# Patient Record
Sex: Female | Born: 1945 | Race: Black or African American | Hispanic: No | Marital: Single | Smoking: Former smoker
Health system: Southern US, Community
[De-identification: ages and names within clinical notes are randomized; demographics above are authoritative.]

## PROBLEM LIST (undated history)

## (undated) DIAGNOSIS — I639 Cerebral infarction, unspecified: Secondary | ICD-10-CM

## (undated) DIAGNOSIS — F411 Generalized anxiety disorder: Secondary | ICD-10-CM

## (undated) DIAGNOSIS — N289 Disorder of kidney and ureter, unspecified: Secondary | ICD-10-CM

## (undated) DIAGNOSIS — R4189 Other symptoms and signs involving cognitive functions and awareness: Secondary | ICD-10-CM

## (undated) DIAGNOSIS — I10 Essential (primary) hypertension: Secondary | ICD-10-CM

## (undated) DIAGNOSIS — J309 Allergic rhinitis, unspecified: Secondary | ICD-10-CM

## (undated) DIAGNOSIS — F329 Major depressive disorder, single episode, unspecified: Secondary | ICD-10-CM

## (undated) DIAGNOSIS — H269 Unspecified cataract: Secondary | ICD-10-CM

## (undated) DIAGNOSIS — F0390 Unspecified dementia without behavioral disturbance: Secondary | ICD-10-CM

## (undated) DIAGNOSIS — H409 Unspecified glaucoma: Secondary | ICD-10-CM

## (undated) DIAGNOSIS — E785 Hyperlipidemia, unspecified: Secondary | ICD-10-CM

## (undated) DIAGNOSIS — J45909 Unspecified asthma, uncomplicated: Secondary | ICD-10-CM

## (undated) DIAGNOSIS — H547 Unspecified visual loss: Secondary | ICD-10-CM

## (undated) HISTORY — DX: Unspecified visual loss: H54.7

## (undated) HISTORY — DX: Generalized anxiety disorder: F41.1

## (undated) HISTORY — PX: OTHER SURGICAL HISTORY: SHX169

## (undated) HISTORY — DX: Major depressive disorder, single episode, unspecified: F32.9

## (undated) HISTORY — DX: Allergic rhinitis, unspecified: J30.9

## (undated) HISTORY — DX: Other symptoms and signs involving cognitive functions and awareness: R41.89

## (undated) HISTORY — DX: Hyperlipidemia, unspecified: E78.5

## (undated) HISTORY — DX: Disorder of kidney and ureter, unspecified: N28.9

## (undated) HISTORY — DX: Unspecified glaucoma: H40.9

## (undated) HISTORY — PX: DILATION AND CURETTAGE OF UTERUS: SHX78

## (undated) HISTORY — DX: Unspecified cataract: H26.9

## (undated) HISTORY — DX: Unspecified dementia, unspecified severity, without behavioral disturbance, psychotic disturbance, mood disturbance, and anxiety: F03.90

## (undated) HISTORY — DX: Unspecified asthma, uncomplicated: J45.909

---

## 2000-07-31 DIAGNOSIS — I639 Cerebral infarction, unspecified: Secondary | ICD-10-CM

## 2000-07-31 HISTORY — DX: Cerebral infarction, unspecified: I63.9

## 2011-09-20 DIAGNOSIS — F411 Generalized anxiety disorder: Secondary | ICD-10-CM | POA: Diagnosis not present

## 2011-09-20 DIAGNOSIS — Z124 Encounter for screening for malignant neoplasm of cervix: Secondary | ICD-10-CM | POA: Diagnosis not present

## 2011-10-05 DIAGNOSIS — E785 Hyperlipidemia, unspecified: Secondary | ICD-10-CM | POA: Diagnosis not present

## 2011-12-14 DIAGNOSIS — I10 Essential (primary) hypertension: Secondary | ICD-10-CM | POA: Diagnosis not present

## 2011-12-14 DIAGNOSIS — J309 Allergic rhinitis, unspecified: Secondary | ICD-10-CM | POA: Diagnosis not present

## 2012-03-14 DIAGNOSIS — I10 Essential (primary) hypertension: Secondary | ICD-10-CM | POA: Diagnosis not present

## 2012-03-14 DIAGNOSIS — I6789 Other cerebrovascular disease: Secondary | ICD-10-CM | POA: Diagnosis not present

## 2012-06-18 DIAGNOSIS — F411 Generalized anxiety disorder: Secondary | ICD-10-CM | POA: Diagnosis not present

## 2012-06-18 DIAGNOSIS — J309 Allergic rhinitis, unspecified: Secondary | ICD-10-CM | POA: Diagnosis not present

## 2012-06-18 DIAGNOSIS — I10 Essential (primary) hypertension: Secondary | ICD-10-CM | POA: Diagnosis not present

## 2012-10-31 DIAGNOSIS — J309 Allergic rhinitis, unspecified: Secondary | ICD-10-CM | POA: Diagnosis not present

## 2012-10-31 DIAGNOSIS — I10 Essential (primary) hypertension: Secondary | ICD-10-CM | POA: Diagnosis not present

## 2012-10-31 DIAGNOSIS — R413 Other amnesia: Secondary | ICD-10-CM | POA: Diagnosis not present

## 2013-03-11 DIAGNOSIS — J309 Allergic rhinitis, unspecified: Secondary | ICD-10-CM | POA: Diagnosis not present

## 2013-03-11 DIAGNOSIS — I10 Essential (primary) hypertension: Secondary | ICD-10-CM | POA: Diagnosis not present

## 2013-04-08 DIAGNOSIS — I10 Essential (primary) hypertension: Secondary | ICD-10-CM | POA: Diagnosis not present

## 2013-04-08 DIAGNOSIS — Z0000 Encounter for general adult medical examination without abnormal findings: Secondary | ICD-10-CM | POA: Diagnosis not present

## 2013-04-08 DIAGNOSIS — Z124 Encounter for screening for malignant neoplasm of cervix: Secondary | ICD-10-CM | POA: Diagnosis not present

## 2013-05-16 ENCOUNTER — Emergency Department (HOSPITAL_COMMUNITY): Payer: Medicare Other

## 2013-05-16 ENCOUNTER — Emergency Department (HOSPITAL_COMMUNITY)
Admission: EM | Admit: 2013-05-16 | Discharge: 2013-05-16 | Disposition: A | Discharge status: Home / Self Care | Payer: Medicare Other | Attending: Emergency Medicine | Admitting: Emergency Medicine

## 2013-05-16 ENCOUNTER — Encounter (HOSPITAL_COMMUNITY): Payer: Self-pay | Admitting: Emergency Medicine

## 2013-05-16 DIAGNOSIS — Y9289 Other specified places as the place of occurrence of the external cause: Secondary | ICD-10-CM | POA: Insufficient documentation

## 2013-05-16 DIAGNOSIS — S8000XA Contusion of unspecified knee, initial encounter: Secondary | ICD-10-CM | POA: Insufficient documentation

## 2013-05-16 DIAGNOSIS — S92919A Unspecified fracture of unspecified toe(s), initial encounter for closed fracture: Secondary | ICD-10-CM | POA: Diagnosis not present

## 2013-05-16 DIAGNOSIS — Z87891 Personal history of nicotine dependence: Secondary | ICD-10-CM | POA: Diagnosis not present

## 2013-05-16 DIAGNOSIS — Y9301 Activity, walking, marching and hiking: Secondary | ICD-10-CM | POA: Insufficient documentation

## 2013-05-16 DIAGNOSIS — IMO0002 Reserved for concepts with insufficient information to code with codable children: Secondary | ICD-10-CM | POA: Insufficient documentation

## 2013-05-16 DIAGNOSIS — X500XXA Overexertion from strenuous movement or load, initial encounter: Secondary | ICD-10-CM | POA: Insufficient documentation

## 2013-05-16 DIAGNOSIS — Z79899 Other long term (current) drug therapy: Secondary | ICD-10-CM | POA: Diagnosis not present

## 2013-05-16 DIAGNOSIS — I10 Essential (primary) hypertension: Secondary | ICD-10-CM | POA: Diagnosis not present

## 2013-05-16 DIAGNOSIS — Z8673 Personal history of transient ischemic attack (TIA), and cerebral infarction without residual deficits: Secondary | ICD-10-CM | POA: Insufficient documentation

## 2013-05-16 DIAGNOSIS — S92911A Unspecified fracture of right toe(s), initial encounter for closed fracture: Secondary | ICD-10-CM

## 2013-05-16 DIAGNOSIS — S8001XA Contusion of right knee, initial encounter: Secondary | ICD-10-CM

## 2013-05-16 DIAGNOSIS — S8990XA Unspecified injury of unspecified lower leg, initial encounter: Secondary | ICD-10-CM | POA: Diagnosis not present

## 2013-05-16 HISTORY — DX: Cerebral infarction, unspecified: I63.9

## 2013-05-16 HISTORY — DX: Essential (primary) hypertension: I10

## 2013-05-16 NOTE — ED Provider Notes (Signed)
CSN: [id number]     Arrival date & time 05/16/13  2146 History  This chart was scribed for non-physician practitioner Kyung Bacca, PA-C working with No att. providers found by Caryn Bee, ED Scribe. This patient was seen in room WTR9/WTR9 and the patient's care was started at 11:02 PM.    Chief Complaint  Patient presents with  . Toe Pain   Patient is a 67 y.o. female presenting with toe pain. The history is provided by the patient. No language interpreter was used.  Toe Pain This is a new problem. The current episode started 1 to 2 hours ago. The problem occurs constantly. The problem has not changed since onset.The symptoms are aggravated by walking (Movement). Nothing relieves the symptoms. She has tried nothing for the symptoms.   HPI Comments: Stacy Brennan is a 67 y.o. female who presents to the Emergency Department complaining of fall that occurred a few hours ago while pt was walking across parking lot and tripped.  Twisted right ankle and landed on her right knee.  C/o pain in her right third toe.  Unable to bear weight.  She has associated numbness of same toe. Pt has an abrasion on her right knee from the fall. Pt has not taken any medication for the pain. Pt denies having lightheadedness/dizziness prior to fall.  Did not hit her head.  Denies neck/back pain.  Past Medical History  Diagnosis Date  . Stroke     CVA x 2  . Hypertension    History reviewed. No pertinent past surgical history. No family history on file. History  Substance Use Topics  . Smoking status: Former Games developer  . Smokeless tobacco: Not on file  . Alcohol Use: No   OB History   Grav Para Term Preterm Abortions TAB SAB Ect Mult Living                 Review of Systems  Musculoskeletal: Positive for arthralgias (Third right toe). Negative for back pain and neck pain.  Skin: Positive for wound (Right knee).  Neurological: Positive for numbness. Negative for weakness and light-headedness.   All other systems reviewed and are negative.    Allergies  Review of patient's allergies indicates no known allergies.  Home Medications   Current Outpatient Rx  Name  Route  Sig  Dispense  Refill  . ALPRAZolam (XANAX) 1 MG tablet   Oral   Take 1 mg by mouth 3 (three) times daily as needed for anxiety.         Marland Kitchen azelastine (ASTELIN) 137 MCG/SPRAY nasal spray   Nasal   Place 1 spray into the nose every morning. Use in each nostril as directed         . cholecalciferol (VITAMIN D) 1000 UNITS tablet   Oral   Take 1,000 Units by mouth every morning.         . clopidogrel (PLAVIX) 75 MG tablet   Oral   Take 75 mg by mouth every morning.         . labetalol (NORMODYNE) 200 MG tablet   Oral   Take 200 mg by mouth 2 (two) times daily.         . Multiple Vitamin (MULTIVITAMIN WITH MINERALS) TABS tablet   Oral   Take 1 tablet by mouth every morning.         Marland Kitchen spironolactone (ALDACTONE) 25 MG tablet   Oral   Take 25 mg by mouth every morning.         Marland Kitchen  verapamil (CALAN-SR) 240 MG CR tablet   Oral   Take 240 mg by mouth at bedtime.         . zafirlukast (ACCOLATE) 10 MG tablet   Oral   Take 5 mg by mouth 2 (two) times daily.          Triage Vitals: BP 218/91  Pulse 70  Temp(Src) 98.4 F (36.9 C) (Oral)  Resp 18  Ht 5\' 7"  (1.702 m)  Wt 176 lb (79.833 kg)  BMI 27.56 kg/m2  SpO2 100%  Physical Exam  Nursing note and vitals reviewed. Constitutional: She is oriented to person, place, and time. She appears well-developed and well-nourished. No distress.  HENT:  Head: Normocephalic and atraumatic.  Eyes:  Normal appearance  Neck: Normal range of motion.  Cardiovascular: Normal rate.   hypertensive  Pulmonary/Chest: Effort normal.  Musculoskeletal: Normal range of motion.  Superficial, hemostatic abrasion, mild edema and minimal tenderness anterior right knee.  No pain w/ ROM.  No edema, tenderness or limited ROM of right ankle.  3rd toe mildly  deformed and ttp.  Sensation intact and brisk cap refill all toes.   Neurological: She is alert and oriented to person, place, and time.  Psychiatric: She has a normal mood and affect. Her behavior is normal.    ED Course  Procedures (including critical care time) DIAGNOSTIC STUDIES: Oxygen Saturation is 100% on room air, normal by my interpretation.    Labs Review Labs Reviewed - No data to display Imaging Review Dg Knee Complete 4 Views Right  05/16/2013   CLINICAL DATA:  Fall.  EXAM: RIGHT KNEE - COMPLETE 4+ VIEW  COMPARISON:  None.  FINDINGS: Negative for fracture or malalignment. No joint effusion. Atherosclerosis and diffuse osseous demineralization.  IMPRESSION: Negative.   Electronically Signed   By: Tiburcio Pea M.D.   On: 05/16/2013 23:10   Dg Foot Complete Right  05/16/2013   CLINICAL DATA:  Third and 4th toe pain, status post tripping.  EXAM: RIGHT FOOT COMPLETE - 3+ VIEW  COMPARISON:  None.  FINDINGS: There is an acute minimally displaced fractures through the distal shaft of the 3rd proximal phalanx. Small amount of associated soft tissue density. No other acute fracture. No radiopaque foreign body. Osseous mineralization is normal. Plantar calcaneal enthesophyte is noted.  IMPRESSION: Acute minimally displaced fracture through the distal shaft of the 3rd proximal phalanx.   Electronically Signed   By: Rise Mu M.D.   On: 05/16/2013 23:29    EKG Interpretation   None       MDM   1. Fracture of right toe, closed, initial encounter   2. Contusion of knee, right, initial encounter    67yo F had a mechanical fall this evening, twisted right ankle and landed on right knee, and presents w/ R knee as well as right 3rd toe pain.  Xray knee negative and foot shows mildly displaced fracture of proximal phalanx right 3rd toe.  Results discussed w/ pt.  Ortho tech buddy taped toes and provided her w/ post-op shoe.  Recommended ice/elevation.  She will talk to her PCP  about safe analgesics as well as persistent HTN (did not take her afternoon/evening meds today; denies dizziness/headache/vision changes/CP/SOB).     I personally performed the services described in this documentation, which was scribed in my presence. The recorded information has been reviewed and is accurate.    Otilio Miu, PA-C 05/17/13 0020

## 2013-05-16 NOTE — ED Notes (Signed)
Pt states while walking across parking lot hit foot and fell onto R knee. Abrasion noted to R 2nd toe, R knee. Denies LOC, denies other injury.

## 2013-05-17 NOTE — ED Provider Notes (Signed)
Medical screening examination/treatment/procedure(s) were performed by non-physician practitioner and as supervising physician I was immediately available for consultation/collaboration.     Celene Kras, MD 05/17/13 [phone]

## 2013-07-09 DIAGNOSIS — J45909 Unspecified asthma, uncomplicated: Secondary | ICD-10-CM | POA: Diagnosis not present

## 2013-07-09 DIAGNOSIS — I10 Essential (primary) hypertension: Secondary | ICD-10-CM | POA: Diagnosis not present

## 2013-07-09 DIAGNOSIS — I6350 Cerebral infarction due to unspecified occlusion or stenosis of unspecified cerebral artery: Secondary | ICD-10-CM | POA: Diagnosis not present

## 2013-07-09 DIAGNOSIS — J309 Allergic rhinitis, unspecified: Secondary | ICD-10-CM | POA: Diagnosis not present

## 2013-07-10 DIAGNOSIS — M65979 Unspecified synovitis and tenosynovitis, unspecified ankle and foot: Secondary | ICD-10-CM | POA: Diagnosis not present

## 2013-07-10 DIAGNOSIS — M79609 Pain in unspecified limb: Secondary | ICD-10-CM | POA: Diagnosis not present

## 2013-07-10 DIAGNOSIS — S92919A Unspecified fracture of unspecified toe(s), initial encounter for closed fracture: Secondary | ICD-10-CM | POA: Diagnosis not present

## 2013-07-10 DIAGNOSIS — M659 Synovitis and tenosynovitis, unspecified: Secondary | ICD-10-CM | POA: Diagnosis not present

## 2013-07-17 DIAGNOSIS — S92919A Unspecified fracture of unspecified toe(s), initial encounter for closed fracture: Secondary | ICD-10-CM | POA: Diagnosis not present

## 2013-08-14 DIAGNOSIS — H4011X0 Primary open-angle glaucoma, stage unspecified: Secondary | ICD-10-CM | POA: Diagnosis not present

## 2013-09-02 DIAGNOSIS — L608 Other nail disorders: Secondary | ICD-10-CM | POA: Diagnosis not present

## 2013-09-02 DIAGNOSIS — I739 Peripheral vascular disease, unspecified: Secondary | ICD-10-CM | POA: Diagnosis not present

## 2013-09-24 DIAGNOSIS — H4011X0 Primary open-angle glaucoma, stage unspecified: Secondary | ICD-10-CM | POA: Diagnosis not present

## 2013-10-03 ENCOUNTER — Ambulatory Visit (INDEPENDENT_AMBULATORY_CARE_PROVIDER_SITE_OTHER): Payer: Medicare Other | Admitting: Internal Medicine

## 2013-10-03 ENCOUNTER — Encounter: Payer: Self-pay | Admitting: Internal Medicine

## 2013-10-03 ENCOUNTER — Ambulatory Visit: Payer: Medicare Other | Admitting: Internal Medicine

## 2013-10-03 VITALS — BP 152/100 | HR 65 | Temp 98.8°F | Wt 165.8 lb

## 2013-10-03 DIAGNOSIS — I10 Essential (primary) hypertension: Secondary | ICD-10-CM | POA: Diagnosis not present

## 2013-10-03 DIAGNOSIS — J069 Acute upper respiratory infection, unspecified: Secondary | ICD-10-CM | POA: Diagnosis not present

## 2013-10-03 DIAGNOSIS — H409 Unspecified glaucoma: Secondary | ICD-10-CM

## 2013-10-03 DIAGNOSIS — I6350 Cerebral infarction due to unspecified occlusion or stenosis of unspecified cerebral artery: Secondary | ICD-10-CM

## 2013-10-03 DIAGNOSIS — I639 Cerebral infarction, unspecified: Secondary | ICD-10-CM

## 2013-10-03 MED ORDER — HYDROCODONE-HOMATROPINE 5-1.5 MG/5ML PO SYRP: 5.0000 mL | ORAL_SOLUTION | Freq: Four times a day (QID) | ORAL | Status: DC | PRN

## 2013-10-03 MED ORDER — AZITHROMYCIN 250 MG PO TABS: ORAL_TABLET | ORAL | Status: DC

## 2013-10-03 NOTE — Patient Instructions (Signed)
Please take all new medication as prescribed  Please continue all other medications as before, and refills have been done if requested.  Please have the pharmacy call with any other refills you may need.   

## 2013-10-03 NOTE — Progress Notes (Signed)
   Subjective:    Patient ID: Stacy Brennan, female    DOB: 1945-08-17, 68 y.o.   MRN: [id number]  HPI   Here with 2-3 days acute onset fever, facial pain, pressure, headache, general weakness and malaise, and greenish d/c, with mild ST and cough, but pt denies chest pain, wheezing, increased sob or doe, orthopnea, PND, increased LE swelling, palpitations, dizziness or syncope. Past Medical History  Diagnosis Date  . Stroke     CVA x 2  . Hypertension   . Glaucoma    No past surgical history on file.  reports that she has quit smoking. She does not have any smokeless tobacco history on file. She reports that she does not drink alcohol or use illicit drugs. family history includes Cancer in her other; Diabetes in her other; Hypertension in her other. No Known Allergies Current Outpatient Prescriptions on File Prior to Visit  Medication Sig Dispense Refill  . ALPRAZolam (XANAX) 1 MG tablet Take 1 mg by mouth 3 (three) times daily as needed for anxiety.      . cholecalciferol (VITAMIN D) 1000 UNITS tablet Take 1,000 Units by mouth every morning.      . clopidogrel (PLAVIX) 75 MG tablet Take 75 mg by mouth every morning.      . labetalol (NORMODYNE) 200 MG tablet Take 200 mg by mouth 2 (two) times daily.      . Multiple Vitamin (MULTIVITAMIN WITH MINERALS) TABS tablet Take 1 tablet by mouth every morning.      Marland Kitchen spironolactone (ALDACTONE) 25 MG tablet Take 25 mg by mouth every morning.      . verapamil (CALAN-SR) 240 MG CR tablet Take 240 mg by mouth at bedtime.      . zafirlukast (ACCOLATE) 10 MG tablet Take 5 mg by mouth 2 (two) times daily.       No current facility-administered medications on file prior to visit.   Review of Systems All otherwise neg per pt     Objective:   Physical Exam BP 152/100  Pulse 65  Temp(Src) 98.8 F (37.1 C) (Oral)  Wt 165 lb 12 oz (75.184 kg)  SpO2 97% VS noted, mild ill Constitutional: Pt appears well-developed and well-nourished.  HENT:  Head: NCAT.  Right Ear: External ear normal.  Left Ear: External ear normal.  Bilat tm's with mild erythema.  Max sinus areas mild tender.  Pharynx with mild erythema, no exudate Eyes: Conjunctivae and EOM are normal. Pupils are equal, round, and reactive to light.  Neck: Normal range of motion. Neck supple.  Cardiovascular: Normal rate and regular rhythm.   Pulmonary/Chest: Effort normal and breath sounds normal.  Neurological: Pt is alert. Not confused  Skin: Skin is warm. No erythema.  Psychiatric: Pt behavior is normal. Thought content normal.     Assessment & Plan:

## 2013-10-03 NOTE — Progress Notes (Signed)
Pre visit review using our clinic review tool, if applicable. No additional management support is needed unless otherwise documented below in the visit note. 

## 2013-10-03 NOTE — Assessment & Plan Note (Signed)
Mild to mod, for antibx course,  to f/u any worsening symptoms or concerns 

## 2013-11-24 DIAGNOSIS — I739 Peripheral vascular disease, unspecified: Secondary | ICD-10-CM | POA: Diagnosis not present

## 2013-11-24 DIAGNOSIS — L608 Other nail disorders: Secondary | ICD-10-CM | POA: Diagnosis not present

## 2013-12-02 ENCOUNTER — Other Ambulatory Visit: Payer: Self-pay

## 2013-12-02 MED ORDER — ALPRAZOLAM 1 MG PO TABS: 1.0000 mg | ORAL_TABLET | Freq: Three times a day (TID) | ORAL | Status: DC | PRN

## 2013-12-02 NOTE — Telephone Encounter (Signed)
Faxed hardcopy to [place].

## 2013-12-24 DIAGNOSIS — H4011X0 Primary open-angle glaucoma, stage unspecified: Secondary | ICD-10-CM | POA: Diagnosis not present

## 2014-01-01 ENCOUNTER — Encounter: Payer: Self-pay | Admitting: Internal Medicine

## 2014-01-01 DIAGNOSIS — N289 Disorder of kidney and ureter, unspecified: Secondary | ICD-10-CM

## 2014-01-01 DIAGNOSIS — J309 Allergic rhinitis, unspecified: Secondary | ICD-10-CM | POA: Insufficient documentation

## 2014-01-01 DIAGNOSIS — F32A Depression, unspecified: Secondary | ICD-10-CM

## 2014-01-01 DIAGNOSIS — E785 Hyperlipidemia, unspecified: Secondary | ICD-10-CM

## 2014-01-01 DIAGNOSIS — F411 Generalized anxiety disorder: Secondary | ICD-10-CM

## 2014-01-01 DIAGNOSIS — Z0000 Encounter for general adult medical examination without abnormal findings: Secondary | ICD-10-CM | POA: Insufficient documentation

## 2014-01-01 DIAGNOSIS — F329 Major depressive disorder, single episode, unspecified: Secondary | ICD-10-CM | POA: Insufficient documentation

## 2014-01-01 DIAGNOSIS — J45909 Unspecified asthma, uncomplicated: Secondary | ICD-10-CM

## 2014-01-01 HISTORY — DX: Disorder of kidney and ureter, unspecified: N28.9

## 2014-01-01 HISTORY — DX: Hyperlipidemia, unspecified: E78.5

## 2014-01-01 HISTORY — DX: Unspecified asthma, uncomplicated: J45.909

## 2014-01-01 HISTORY — DX: Allergic rhinitis, unspecified: J30.9

## 2014-01-01 HISTORY — DX: Generalized anxiety disorder: F41.1

## 2014-01-01 HISTORY — DX: Depression, unspecified: F32.A

## 2014-01-06 ENCOUNTER — Other Ambulatory Visit: Payer: Self-pay

## 2014-01-06 MED ORDER — SPIRONOLACTONE 25 MG PO TABS: 25.0000 mg | ORAL_TABLET | Freq: Every morning | ORAL | Status: DC

## 2014-01-10 ENCOUNTER — Other Ambulatory Visit: Payer: Self-pay | Admitting: Internal Medicine

## 2014-01-13 ENCOUNTER — Telehealth: Payer: Self-pay

## 2014-01-13 MED ORDER — ALPRAZOLAM 1 MG PO TABS: ORAL_TABLET | ORAL | Status: DC

## 2014-01-13 NOTE — Telephone Encounter (Signed)
Called to have a refill for xanax qty 90 sent to [facility]. Pt takes these tid for anxiety. The wrong quantity was sent to [place] for a qty of 30. Please advise

## 2014-01-13 NOTE — Telephone Encounter (Signed)
Faxed hardcopy to [place].  Called the patient to inform, but twice was told the wrong number.

## 2014-01-13 NOTE — Telephone Encounter (Signed)
rx changed

## 2014-01-13 NOTE — Telephone Encounter (Signed)
Done hardcopy to [facility]  

## 2014-01-13 NOTE — Telephone Encounter (Signed)
Faxed hardcopy to [facility]

## 2014-03-03 ENCOUNTER — Other Ambulatory Visit: Payer: Self-pay

## 2014-03-03 MED ORDER — ZAFIRLUKAST 10 MG PO TABS: 5.0000 mg | ORAL_TABLET | Freq: Two times a day (BID) | ORAL | Status: DC

## 2014-03-03 MED ORDER — LEVOCETIRIZINE DIHYDROCHLORIDE 5 MG PO TABS: 5.0000 mg | ORAL_TABLET | Freq: Every day | ORAL | Status: DC

## 2014-03-03 MED ORDER — LABETALOL HCL 200 MG PO TABS: 200.0000 mg | ORAL_TABLET | Freq: Two times a day (BID) | ORAL | Status: DC

## 2014-03-11 ENCOUNTER — Other Ambulatory Visit: Payer: Self-pay

## 2014-03-11 MED ORDER — VERAPAMIL HCL ER 240 MG PO TBCR: 240.0000 mg | EXTENDED_RELEASE_TABLET | Freq: Every day | ORAL | Status: DC

## 2014-03-12 DIAGNOSIS — H5310 Unspecified subjective visual disturbances: Secondary | ICD-10-CM | POA: Diagnosis not present

## 2014-03-19 ENCOUNTER — Encounter: Payer: Self-pay | Admitting: *Deleted

## 2014-03-20 ENCOUNTER — Encounter: Payer: Self-pay | Admitting: Neurology

## 2014-03-20 ENCOUNTER — Ambulatory Visit (INDEPENDENT_AMBULATORY_CARE_PROVIDER_SITE_OTHER): Payer: Medicare Other | Admitting: Neurology

## 2014-03-20 VITALS — BP 162/82 | HR 66 | Ht 65.0 in | Wt 159.0 lb

## 2014-03-20 DIAGNOSIS — I6350 Cerebral infarction due to unspecified occlusion or stenosis of unspecified cerebral artery: Secondary | ICD-10-CM | POA: Diagnosis not present

## 2014-03-20 DIAGNOSIS — G4710 Hypersomnia, unspecified: Secondary | ICD-10-CM

## 2014-03-20 DIAGNOSIS — R413 Other amnesia: Secondary | ICD-10-CM | POA: Diagnosis not present

## 2014-03-20 DIAGNOSIS — R404 Transient alteration of awareness: Secondary | ICD-10-CM

## 2014-03-20 DIAGNOSIS — F329 Major depressive disorder, single episode, unspecified: Secondary | ICD-10-CM

## 2014-03-20 DIAGNOSIS — I639 Cerebral infarction, unspecified: Secondary | ICD-10-CM

## 2014-03-20 DIAGNOSIS — F3289 Other specified depressive episodes: Secondary | ICD-10-CM

## 2014-03-20 DIAGNOSIS — IMO0001 Reserved for inherently not codable concepts without codable children: Secondary | ICD-10-CM

## 2014-03-20 DIAGNOSIS — G4719 Other hypersomnia: Secondary | ICD-10-CM

## 2014-03-20 DIAGNOSIS — R0683 Snoring: Secondary | ICD-10-CM

## 2014-03-20 DIAGNOSIS — R4189 Other symptoms and signs involving cognitive functions and awareness: Secondary | ICD-10-CM

## 2014-03-20 DIAGNOSIS — H269 Unspecified cataract: Secondary | ICD-10-CM

## 2014-03-20 DIAGNOSIS — F32A Depression, unspecified: Secondary | ICD-10-CM

## 2014-03-20 DIAGNOSIS — G4700 Insomnia, unspecified: Secondary | ICD-10-CM

## 2014-03-20 NOTE — Patient Instructions (Signed)
Overall you are doing fairly well but I do want to suggest a few things today:   Remember to drink plenty of fluid, eat healthy meals and do not skip any meals. Try to eat protein with a every meal and eat a healthy snack such as fruit or nuts in between meals. Try to keep a regular sleep-wake schedule and try to exercise daily, particularly in the form of walking, 20-30 minutes a day, if you can.   As far as diagnostic testing: Sleep study, EEG, blood work and carotid dopplers  I would like to see you back in 3 months, sooner if we need to. Please call us with any interim questions, concerns, problems, updates or refill requests.   We will call for any test results so we can go over those with you on the phone.  My clinical assistant and can answer any of your questions and relay your messages to me and also relay most of my messages to you.   Our phone number is [phone]. We also have an after hours call service for urgent matters and there is a physician on-call for urgent questions. For any emergencies you know to call 911 or go to the nearest emergency room

## 2014-03-20 NOTE — Progress Notes (Signed)
[facility]    Provider:  Dr Jaynee Eagles Referring Provider: Biagio Borg, MD Primary Care Physician:  Cathlean Cower, MD  CC:  Visual difficulties and difficulty processing information  HPI:  Stacy Brennan is a 68 y.o. female here as a referral from Dr. Jenny Reichmann for Visual difficulties  68 year old female with a PMHx of HTN, Stroke, Severe Stage Glaucoma and senile cataract who is here for evaluation of vision problems. Accompanied by daughter. Per daughter,  patient is having difficulty seeing for example she thought a paper towel was a salad. Been going on for at least a year. Another example is that she looks in the refrigerator and sees lettuce and thinks it is grapes. Can't sign on a line because she can't see it. No hallucinations or delusions. She runs into tables. Is here with daughter who states that her mom remembers things overall just ok and there is  forgetfulness. Mom is sometimes going the opposite way to stores. The mother disagrees. Is corrected by daughter. In the grocery store mother forgets. Gets anxious about money. Memory symptoms progressing. Mother can't find the car. Patient has lost interest in going to some events. Mother won't go to daughter's house unless daughter encourages. Mom says it is because she can't see. Patient has some depression because she had to move in with daughter. Patient misses her church but she can't get to her church now cause it is too far away. Moved in 4 years ago and that loss hasn't resolved. +sadness, loss of interest, loss of concentration. Goes to bed around 1:30am and Per daughter has insomnia and doesn't go to sleep until 2-5am. +snoring. No headache. +excessive tiredness during the day.   Review of notes from [facility]: VA OD 20/200, VA OS 20/200, VA OU 20/150. She has severe stage glaucoma, senile cataract  Review of Systems: Patient complains of symptoms per HPI as well as the following symptoms blurred  vision, fatigue, loss of vision, easy bruising, confusion, insomnia, sleepiness, snoring, constipation, depression, anxiety, decreased energy, disinterest in activities. Pertinent negatives per HPI. Otherwise out of a complete 14 system review, and all other reviewed systems are negative.   History   Social History  . Marital Status: Single    Spouse Name: N/A    Number of Children: 4  . Years of Education: 12   Occupational History  . [place] History Main Topics  . Smoking status: Former Smoker    Quit date: 03/20/2002  . Smokeless tobacco: Current User    Types: Snuff  . Alcohol Use: No  . Drug Use: No  . Sexual Activity: Not on file   Other Topics Concern  . Not on file   Social History Narrative   Patient is single with 4 children.   Patient is right handed.   Patient has a high school education.   Patient drinks 1 cup a week.    Family History  Problem Relation Age of Onset  . Cancer Other     prostate cancer  . Hypertension Other   . Diabetes Other   . Thyroid disease    . Hyperlipidemia      Past Medical History  Diagnosis Date  . 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 Hypertension   . Glaucoma   . Anxiety state, unspecified 01/01/2014  . Other and unspecified hyperlipidemia 01/01/2014  . Renal insufficiency 01/01/2014  . Allergic rhinitis, cause unspecified  01/01/2014  . Unspecified asthma(493.90) 01/01/2014  . Depression 01/01/2014    Past Surgical History  Procedure Laterality Date  . Dilation and curettage of uterus    . Tubalization      Current Outpatient Prescriptions  Medication Sig Dispense Refill  . ALPRAZolam (XANAX) 1 MG tablet TAKE 1 TABLET BY MOUTH THREE TIMES DAILY  90 tablet  2  . cholecalciferol (VITAMIN D) 1000 UNITS tablet Take 1,000 Units by mouth every morning.      . clopidogrel (PLAVIX) 75 MG tablet Take 75 mg by mouth every morning.      Marland Kitchen HYDROcodone-homatropine (HYCODAN) 5-1.5 MG/5ML syrup Take 5 mLs by mouth every 6 (six)  hours as needed for cough.  180 mL  0  . labetalol (NORMODYNE) 200 MG tablet Take 1 tablet (200 mg total) by mouth 2 (two) times daily.  60 tablet  6  . latanoprost (XALATAN) 0.005 % ophthalmic solution Place 1 drop into both eyes at bedtime.      Marland Kitchen levocetirizine (XYZAL) 5 MG tablet Take 1 tablet (5 mg total) by mouth daily.  30 tablet  6  . Multiple Vitamin (MULTIVITAMIN WITH MINERALS) TABS tablet Take 1 tablet by mouth every morning.      . Olopatadine HCl (PATADAY) 0.2 % SOLN Apply to eye.      . spironolactone (ALDACTONE) 25 MG tablet Take 1 tablet (25 mg total) by mouth every morning.  30 tablet  10  . verapamil (CALAN-SR) 240 MG CR tablet Take 1 tablet (240 mg total) by mouth at bedtime.  30 tablet  11  . zafirlukast (ACCOLATE) 10 MG tablet Take 0.5 tablets (5 mg total) by mouth 2 (two) times daily.  60 tablet  1   No current facility-administered medications for this visit.    Allergies as of 03/20/2014  . (No Known Allergies)    Vitals: BP 162/82  Pulse 66  Ht 5\' 5"  (1.651 m)  Wt 159 lb (72.122 kg)  BMI 26.46 kg/m2 Last Weight:  Wt Readings from Last 1 Encounters:  03/20/14 159 lb (72.122 kg)   Last Height:   Ht Readings from Last 1 Encounters:  03/20/14 5\' 5"  (1.651 m)     Physical exam: Exam: Gen: NAD, conversant Eyes: anicteric sclerae, moist conjunctivae HENT: Atraumatic, oropharynx clear Neck: Trachea midline; supple,  Lungs: CTA, no wheezing, rales, rhonic                          CV: RRR, no MRG Abdomen: Soft, non-tender;  Extremities: No peripheral edema  Skin: Normal temperature, no rash,  Psych: Appropriate affect, pleasant  Neuro:    Speech:    Speech is normal; fluent and spontaneous with normal comprehension.   Cognition:    The patient is oriented to person, place, and time; Decreased vision prevented [place] from completing the [facility], MMSE 15/30.  Cranial Nerves:    The pupils are equal.. Could not visualize the fundi. Visual fields are full  to finger waving. Extraocular movements are intact. Right 20/200, left intact to light and waving fingers. Trigeminal sensation is intact and the muscles of mastication are normal. The face is symmetric. The palate elevates in the midline. Voice is normal. Shoulder shrug is normal. The tongue has normal motion without fasciculations.   Coordination:    Normal finger to nose and heel to shin. Normal rapid alternating movements.   Gait:    Good strides and arm swing.  Motor Observation:  No tremor    Tone:    Normal muscle tone.    Posture:    Posture is normal.    Strength:    Strength is V/V in the upper and lower limbs.         Light Touch:    Normal light touch sensation in upper and lower extremities.    Cortical Sensory Modalities:    No neglect   Reflex Exam:  DTR's:    Deep tendon reflexes are brisk in the upper and lower extremities  Toes:    The toes are mute Clonus:    Clonus is absent.  Other Reflexes: Negative glabellar and snout  Assessment: 68 year old female with a PMHx of HTN, Stroke, Severe Stage Glaucoma and senile cataract who is here for evaluation of vision problems. Accompanied by daughter. Per daughter,  patient is having difficulty seeing but the main concern is forgetfulness and progressive memory problems with insomnia, decreased concentration and loss of interest, sadness over having to sell her home and move in with daughter, insomnia and erratic sleeping schedule, snoring, excessive tiredness during the day. Neuro exam showed VA of 20/200 right eye and left eye able to only see light and object such as waving fingers, brisk reflexes, negative frontal release signs glabellar and snout.   Plan:   Cognition difficulties: Decreased visual acuity made it difficult to perform MOCA. MMSE 15/30. Differential could be dementia vs pseudodementia possibly due to depression. Erratic sleeping schedule, snoring and excessive daytime drowsiness could be  signs of OSA which can cause cognitive difficulties. Given history of stroke in 2002 will order MRI of the brain to evaluate for possibly continuing cerebrovascular events or other pathology. Will also order sleep study. Will order a serum dementia panel.   Depression: Encouraged daughter to follow up with a therapist and/or psychiatrist. Will discuss again after workup. We can also consider neuropsychiatric testing at a later time.  Stroke: Will order an MRI of the brain as well as carotid dopplers to evaluate. Needs to follow closely with primary care to decrease stroke risks such as tight control of HTN, Cholesterol and glucose. Continue Plavix.   Snoring, excessive fatigue daytime, insomnia: Will order sleep study  Episodic confusion: Will order an EEG  Vision difficulties: Patient's vision is decreased due to severe-stage glaucoma and cataracts. Patient's difficulty seeing items and thinking a paper towel is salad is most likely due to severely decreased visual acuity. No indication that she has hallucinations or delusions.    A total of 45 minutes was spent in with this patient. Over half this time was spent on counseling patient on the diagnosis and different therapeutic options available.   Sarina Ill, MD  [facility] [address] [place], [place] [address]  Phone [phone] Fax [phone]

## 2014-03-22 ENCOUNTER — Encounter: Payer: Self-pay | Admitting: Neurology

## 2014-03-22 DIAGNOSIS — R0683 Snoring: Secondary | ICD-10-CM | POA: Insufficient documentation

## 2014-03-22 DIAGNOSIS — G4719 Other hypersomnia: Secondary | ICD-10-CM | POA: Insufficient documentation

## 2014-03-22 DIAGNOSIS — R4189 Other symptoms and signs involving cognitive functions and awareness: Secondary | ICD-10-CM | POA: Insufficient documentation

## 2014-03-22 DIAGNOSIS — H269 Unspecified cataract: Secondary | ICD-10-CM | POA: Insufficient documentation

## 2014-03-22 DIAGNOSIS — G4700 Insomnia, unspecified: Secondary | ICD-10-CM | POA: Insufficient documentation

## 2014-03-24 LAB — RPR: SYPHILIS RPR SCR: NONREACTIVE

## 2014-03-24 LAB — TSH: TSH: 1.46 u[IU]/mL (ref 0.450–4.500)

## 2014-03-24 LAB — METHYLMALONIC ACID, SERUM: METHYLMALONIC ACID: 262 nmol/L (ref 0–378)

## 2014-03-24 LAB — VITAMIN B1, WHOLE BLOOD: Thiamine: 220.5 nmol/L — ABNORMAL HIGH (ref 66.5–200.0)

## 2014-03-24 LAB — AMMONIA: Ammonia: 87 ug/dL (ref 19–87)

## 2014-03-24 LAB — SEDIMENTATION RATE: Sed Rate: 9 mm/hr (ref 0–40)

## 2014-03-25 ENCOUNTER — Other Ambulatory Visit: Payer: Self-pay | Admitting: Neurology

## 2014-03-25 DIAGNOSIS — H4011X0 Primary open-angle glaucoma, stage unspecified: Secondary | ICD-10-CM | POA: Diagnosis not present

## 2014-03-25 DIAGNOSIS — R4189 Other symptoms and signs involving cognitive functions and awareness: Secondary | ICD-10-CM

## 2014-03-25 DIAGNOSIS — R4689 Other symptoms and signs involving appearance and behavior: Secondary | ICD-10-CM

## 2014-03-25 DIAGNOSIS — G4700 Insomnia, unspecified: Secondary | ICD-10-CM

## 2014-03-25 DIAGNOSIS — G4719 Other hypersomnia: Secondary | ICD-10-CM

## 2014-03-25 DIAGNOSIS — R0683 Snoring: Secondary | ICD-10-CM

## 2014-03-25 NOTE — Progress Notes (Signed)
Quick Note:  Called patient, no answer and was unable to leave a message will try again later. ______

## 2014-03-26 ENCOUNTER — Other Ambulatory Visit: Payer: Medicare Other | Admitting: Radiology

## 2014-03-26 ENCOUNTER — Telehealth: Payer: Self-pay | Admitting: Radiology

## 2014-03-26 ENCOUNTER — Other Ambulatory Visit: Payer: Medicare Other

## 2014-03-26 ENCOUNTER — Telehealth: Payer: Self-pay | Admitting: Neurology

## 2014-03-26 NOTE — Telephone Encounter (Signed)
Called pt and daughter per Dr. Jaynee Eagles and left message informing them that the pt's lab work results were normal and if the pt has any other problems, questions or concerns to call the office.

## 2014-03-26 NOTE — Telephone Encounter (Signed)
No show - Cancelled < 24 hrs. For doppler and EEG

## 2014-04-01 ENCOUNTER — Telehealth: Payer: Self-pay | Admitting: Neurology

## 2014-04-01 DIAGNOSIS — I639 Cerebral infarction, unspecified: Secondary | ICD-10-CM

## 2014-04-01 DIAGNOSIS — G478 Other sleep disorders: Secondary | ICD-10-CM

## 2014-04-01 DIAGNOSIS — E663 Overweight: Secondary | ICD-10-CM

## 2014-04-01 DIAGNOSIS — R0683 Snoring: Secondary | ICD-10-CM

## 2014-04-01 NOTE — Telephone Encounter (Signed)
Dr. Lois Huxley patient for attended sleep study.  Height: 5'5  Weight: 159 lb.  BMI: 26.46  Past Medical History:  .  Stroke  2002     CVA x 2   .  Hypertension    .  Glaucoma    .  Anxiety state, unspecified  01/01/2014   .  Other and unspecified hyperlipidemia  01/01/2014   .  Renal insufficiency  01/01/2014   .  Allergic rhinitis, cause unspecified  01/01/2014   .  Unspecified asthma(493.90)  01/01/2014   .  Depression  01/01/2014      Sleep Symptoms: insomnia and erratic sleeping schedule, snoring, excessive tiredness during the day.    Epworth Score: unable to reach pt.   Medication:  ALPRAZolam (Tab) XANAX 1 MG TAKE 1 TABLET BY MOUTH THREE TIMES DAILY Cholecalciferol (Tab) VITAMIN D 1000 UNITS Take 1,000 Units by mouth every morning. Clopidogrel Bisulfate (Tab) PLAVIX 75 MG Take 75 mg by mouth every morning. Hydrocodone-Homatropine (Syrup) HYCODAN 5-1.5 MG/5ML Take 5 mLs by mouth every 6 (six) hours as needed for cough. Labetalol HCl (Tab) NORMODYNE 200 MG Take 1 tablet (200 mg total) by mouth 2 (two) times daily. Latanoprost (Solution) XALATAN 0.005 % Place 1 drop into both eyes at bedtime. Levocetirizine Dihydrochloride (Tab) XYZAL 5 MG Take 1 tablet (5 mg total) by mouth daily. Multiple Vitamin (Tab) multivitamin with minerals Take 1 tablet by mouth every morning. Olopatadine HCl (Solution) Olopatadine HCl 0.2 % Apply to eye. Spironolactone (Tab) ALDACTONE 25 MG Take 1 tablet (25 mg total) by mouth every morning. Verapamil HCl (Tab CR) CALAN-SR 240 MG Take 1 tablet (240 mg total) by mouth at bedtime. Zafirlukast (Tab) ACCOLATE 10 MG Take 0.5 tablets (5 mg total) by mouth 2 (two) times daily.    Ins:  Medicare/Medicaid   Assessment & Plan: 68 year old female with a PMHx of HTN, Stroke, Severe Stage Glaucoma and senile cataract who is here for evaluation of vision problems. Accompanied by daughter. Per daughter, patient is having difficulty seeing but the main concern is  forgetfulness and progressive memory problems with insomnia, decreased concentration and loss of interest, sadness over having to sell her home and move in with daughter, insomnia and erratic sleeping schedule, snoring, excessive tiredness during the day. Neuro exam showed VA of 20/200 right eye and left eye able to only see light and object such as waving fingers, brisk reflexes, negative frontal release signs glabellar and snout.    Cognition difficulties: Decreased visual acuity made it difficult to perform MOCA. MMSE 15/30. Differential could be dementia vs pseudodementia possibly due to depression. Erratic sleeping schedule, snoring and excessive daytime drowsiness could be signs of OSA which can cause cognitive difficulties. Given history of stroke in 2002 will order MRI of the brain to evaluate for possibly continuing cerebrovascular events or other pathology. Will also order sleep study. Will order a serum dementia panel.  Depression: Encouraged daughter to follow up with a therapist and/or psychiatrist. Will discuss again after workup. We can also consider neuropsychiatric testing at a later time.  Stroke: Will order an MRI of the brain as well as carotid dopplers to evaluate. Needs to follow closely with primary care to decrease stroke risks such as tight control of HTN, Cholesterol and glucose. Continue Plavix.  Snoring, excessive fatigue daytime, insomnia: Will order sleep study  Episodic confusion: Will order an EEG  Vision difficulties: Patient's vision is decreased due to severe-stage glaucoma and cataracts. Patient's difficulty seeing items and thinking  a paper towel is salad is most likely due to severely decreased visual acuity. No indication that she has hallucinations or delusions.    Please review patient information and submit instructions for scheduling and orders for sleep technologist. Thank you.

## 2014-04-01 NOTE — Telephone Encounter (Signed)
Sleep study request review: This patient has an underlying medical history of hypertension, stroke, glaucoma, renal insufficiency and overweight state and is referred by Dr. Jaynee Eagles for an attended sleep study due to a report of sleep disruption and daytime tiredness and snoring. I will order a split-night sleep study and see the patient in sleep medicine consultation afterwards. Star Age, MD, PhD [facility])

## 2014-04-05 ENCOUNTER — Inpatient Hospital Stay: Admission: RE | Admit: 2014-04-05 | Payer: Medicare Other | Source: Ambulatory Visit

## 2014-04-06 ENCOUNTER — Other Ambulatory Visit: Payer: Self-pay | Admitting: Internal Medicine

## 2014-04-07 NOTE — Telephone Encounter (Signed)
Done hardcopy to [facility]  

## 2014-04-08 NOTE — Telephone Encounter (Signed)
Faxed hardcopy for Alprazolam [place]

## 2014-04-09 ENCOUNTER — Ambulatory Visit (INDEPENDENT_AMBULATORY_CARE_PROVIDER_SITE_OTHER): Payer: Medicare Other

## 2014-04-09 DIAGNOSIS — R404 Transient alteration of awareness: Secondary | ICD-10-CM | POA: Diagnosis not present

## 2014-04-09 DIAGNOSIS — IMO0001 Reserved for inherently not codable concepts without codable children: Secondary | ICD-10-CM

## 2014-04-09 DIAGNOSIS — R413 Other amnesia: Secondary | ICD-10-CM | POA: Diagnosis not present

## 2014-04-09 NOTE — Procedures (Signed)
    History:  Stacy Brennan is a 68 year old patient with a history of severe glaucoma and cataracts. The patient reports some problems with visual hallucinations. The patient is being evaluated for this issue.  This is a routine EEG. No skull defects are noted. Medications include alprazolam, vitamin D, Plavix, hydrocodone, labetalol, Xalatan, multivitamins, spironolactone, verapamil, and Accolate.   EEG classification: Normal awake  Description of the recording: The background rhythms of this recording consists of a fairly well modulated medium amplitude alpha rhythm of 9 Hz that is reactive to eye opening and closure. An overlying beta frequency activity seen throughout the recording. This is generalized in nature. As the record progresses, the patient appears to remain in the waking state throughout the recording. Photic stimulation was performed, resulting in a bilateral and symmetric photic driving response. Hyperventilation was also performed, resulting in a minimal buildup of the background rhythm activities without significant slowing seen. At no time during the recording does there appear to be evidence of spike or spike wave discharges or evidence of focal slowing. EKG monitor shows no evidence of cardiac rhythm abnormalities with a heart rate of 56.  Impression: This is a normal EEG recording in the waking state. No evidence of ictal or interictal discharges are seen. The overlying beta frequency activity is likely a medication effect, and may be seen with the use of benzodiazepine such as alprazolam.

## 2014-04-14 NOTE — Progress Notes (Signed)
Quick Note:  Left voice message of normal EEG results, and to call the office back with any questions or concerns. ______

## 2014-04-17 ENCOUNTER — Ambulatory Visit
Admission: RE | Admit: 2014-04-17 | Discharge: 2014-04-17 | Disposition: A | Discharge status: Home / Self Care | Payer: Medicare Other | Source: Ambulatory Visit | Attending: Neurology | Admitting: Neurology

## 2014-04-17 DIAGNOSIS — I639 Cerebral infarction, unspecified: Secondary | ICD-10-CM

## 2014-04-17 DIAGNOSIS — R413 Other amnesia: Secondary | ICD-10-CM

## 2014-04-28 ENCOUNTER — Telehealth: Payer: Self-pay | Admitting: *Deleted

## 2014-04-28 NOTE — Telephone Encounter (Signed)
Left msg on triage stating mom had to take a MRI & she took extra on her xanax because of the anxiety issues. Her refill is due on 05/04/14, but wanting to see will md renew refill early...Stacy Brennan

## 2014-04-28 NOTE — Telephone Encounter (Signed)
Very sorry, but I cant support this, as this is a controlled substance and is against the law to give to other persons

## 2014-04-29 ENCOUNTER — Telehealth: Payer: Self-pay | Admitting: Neurology

## 2014-04-29 NOTE — Telephone Encounter (Signed)
This can be addressed by her PCP when he returns

## 2014-04-29 NOTE — Telephone Encounter (Signed)
Just received a phone call from the pharmacist at [facility] on Stacy Brennan [phone]).  She stated the patient came in to the pharmacy last night requesting an early refill on her Alprazolam 1 mg.  The pharmacist states this prescription was just filled on April 08, 2014 for #90.  Advise please.

## 2014-04-29 NOTE — Telephone Encounter (Signed)
Notified pt daughter with md response. She stated she didn't want a early refill since she had to take extra when she had her MRI she is short. wanting to see if md would call in 4 pills. MD is out of office pls advise...Johny Chess

## 2014-04-29 NOTE — Telephone Encounter (Signed)
Pharmacist informed of MD instructions on refill request

## 2014-04-29 NOTE — Telephone Encounter (Signed)
Very sorry, I cannot support early refills for controlled substances, due to increased use or diversion other , as this can be monitored by the [facility] and the [facility] Medical Board

## 2014-04-29 NOTE — Telephone Encounter (Signed)
It does not appear we prescribe Xanax.  I called back.  They will contact the MD that prescribed this med to ask for refill.

## 2014-04-29 NOTE — Telephone Encounter (Signed)
Patient's daughter Casimer Lanius @ [phone], stated Mom had MRI and felt she was having a Anxiety attack.  She took a few extra Xanax for MRI.  Requesting 4 to 6 tablets needed until Rx refill due on 10/5.  Please call anytime and may leave detailed message on voicemail.

## 2014-05-01 ENCOUNTER — Telehealth: Payer: Self-pay | Admitting: Internal Medicine

## 2014-05-01 ENCOUNTER — Encounter: Payer: Self-pay | Admitting: Internal Medicine

## 2014-05-01 ENCOUNTER — Ambulatory Visit (INDEPENDENT_AMBULATORY_CARE_PROVIDER_SITE_OTHER): Payer: Medicare Other | Admitting: Internal Medicine

## 2014-05-01 VITALS — BP 190/100 | HR 95 | Temp 98.4°F | Wt 156.5 lb

## 2014-05-01 DIAGNOSIS — F1320 Sedative, hypnotic or anxiolytic dependence, uncomplicated: Secondary | ICD-10-CM

## 2014-05-01 DIAGNOSIS — I639 Cerebral infarction, unspecified: Secondary | ICD-10-CM

## 2014-05-01 DIAGNOSIS — J4520 Mild intermittent asthma, uncomplicated: Secondary | ICD-10-CM

## 2014-05-01 DIAGNOSIS — F411 Generalized anxiety disorder: Secondary | ICD-10-CM

## 2014-05-01 DIAGNOSIS — F329 Major depressive disorder, single episode, unspecified: Secondary | ICD-10-CM

## 2014-05-01 DIAGNOSIS — I10 Essential (primary) hypertension: Secondary | ICD-10-CM

## 2014-05-01 DIAGNOSIS — F32A Depression, unspecified: Secondary | ICD-10-CM

## 2014-05-01 MED ORDER — PAROXETINE HCL 10 MG PO TABS: 10.0000 mg | ORAL_TABLET | Freq: Every day | ORAL | Status: DC

## 2014-05-01 MED ORDER — ALPRAZOLAM 1 MG PO TABS: ORAL_TABLET | ORAL | Status: DC

## 2014-05-01 NOTE — Telephone Encounter (Signed)
Called the daughter back informed of MD instructions.  She agreed to call back today at noon once Saturday clinic schedule is open.  The daughter did want to know if MD seeing the patient tomorrow would give her mother 4 xanax?  Informed would send question back to PCP.

## 2014-05-01 NOTE — Patient Instructions (Signed)
Please take all new medication as prescribed - the paxil 10 mg per day (sent to [facility])  Please continue all other medications as before, and refills have been done if requested - the short course of xanax  Please keep track of all pills carefully without dropping  Please keep your appointments with your specialists as you may have planned  Please return in 1 months, or sooner if needed (the office will call)

## 2014-05-01 NOTE — Telephone Encounter (Signed)
Message received, but Xanax w/d does not involve fever, and a few xtra doses would not lead to this as well  Please consider OV for fever illness, which is more likely the cause of her symptoms and shakes

## 2014-05-01 NOTE — Telephone Encounter (Signed)
Called left message to call back 

## 2014-05-01 NOTE — Progress Notes (Signed)
Subjective:    Patient ID: Stacy Brennan, female    DOB: 08-Nov-1945, 68 y.o.   MRN: [id number]  HPI  Here for eval for fever, but on arrival pt and daughter with her deny fever, but has felt "warm" on occasion in the last few days. No HA, ST, cough, Pt denies chest pain, increased sob or doe, wheezing, orthopnea, PND, increased LE swelling, palpitations, dizziness or syncope.  Per daughter, pt has dropped some her meds to the floor in the act of getting out of bottle and ingesting, then daughter vacuums them up.  Has only been a recent issue, no prior hx of lost xanax.  Has been on xanax for 13 yrs per pt, now with marked agitation, anxiety, tremulous as has no further xanax and cannot get early refill.  No prior hx of diversion or misuse.  Daguhter plans to help her with meds in future.  Pt denies fever, wt loss, night sweats, loss of appetite, or other constitutional symptoms. Denies worsening depressive symptoms, suicidal ideation, but had near panic attack this am per pt Past Medical History  Diagnosis Date  . 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 Hypertension   . Glaucoma   . Anxiety state, unspecified 01/01/2014  . Other and unspecified hyperlipidemia 01/01/2014  . Renal insufficiency 01/01/2014  . Allergic rhinitis, cause unspecified 01/01/2014  . Unspecified asthma(493.90) 01/01/2014  . Depression 01/01/2014  . Vision decreased   . Cataract    Past Surgical History  Procedure Laterality Date  . Dilation and curettage of uterus    . Tubalization      reports that she quit smoking about 12 years ago. Her smokeless tobacco use includes Snuff. She reports that she does not drink alcohol or use illicit drugs. family history includes Cancer in her other; Diabetes in her other; Hyperlipidemia in an other family member; Hypertension in her other; Thyroid disease in an other family member. There is no history of Parkinsonism or Dementia. No Known Allergies Current Outpatient Prescriptions on File Prior to  Visit  Medication Sig Dispense Refill  . cholecalciferol (VITAMIN D) 1000 UNITS tablet Take 1,000 Units by mouth every morning.      . clopidogrel (PLAVIX) 75 MG tablet Take 75 mg by mouth every morning.      . labetalol (NORMODYNE) 200 MG tablet Take 1 tablet (200 mg total) by mouth 2 (two) times daily.  60 tablet  6  . latanoprost (XALATAN) 0.005 % ophthalmic solution Place 1 drop into both eyes at bedtime.      Marland Kitchen levocetirizine (XYZAL) 5 MG tablet Take 1 tablet (5 mg total) by mouth daily.  30 tablet  6  . Multiple Vitamin (MULTIVITAMIN WITH MINERALS) TABS tablet Take 1 tablet by mouth every morning.      . Olopatadine HCl (PATADAY) 0.2 % SOLN Apply to eye.      . spironolactone (ALDACTONE) 25 MG tablet Take 1 tablet (25 mg total) by mouth every morning.  30 tablet  10  . verapamil (CALAN-SR) 240 MG CR tablet Take 1 tablet (240 mg total) by mouth at bedtime.  30 tablet  11  . zafirlukast (ACCOLATE) 10 MG tablet Take 0.5 tablets (5 mg total) by mouth 2 (two) times daily.  60 tablet  1   No current facility-administered medications on file prior to visit.   Review of Systems  Constitutional: Negative for unusual diaphoresis or other sweats  HENT: Negative for ringing in ear Eyes: Negative  for double vision or worsening visual disturbance.  Respiratory: Negative for choking and stridor.   Gastrointestinal: Negative for vomiting or other signifcant bowel change Genitourinary: Negative for hematuria or decreased urine volume.  Musculoskeletal: Negative for other MSK pain or swelling Skin: Negative for color change and worsening wound.  Neurological: Negative for tremors and numbness other than noted  Psychiatric/Behavioral: Negative for decreased concentration or agitation other than above       Objective:   Physical Exam BP 190/100  Pulse 95  Temp(Src) 98.4 F (36.9 C) (Oral)  Wt 156 lb 8 oz (70.988 kg)  SpO2 95% VS noted,  Constitutional: Pt appears well-developed,  well-nourished.  HENT: Head: NCAT.  Right Ear: External ear normal.  Left Ear: External ear normal.  Eyes: . Pupils are equal, round, and reactive to light. Conjunctivae and EOM are normal Neck: Normal range of motion. Neck supple.  Cardiovascular: Normal rate and regular rhythm.   Pulmonary/Chest: Effort normal and breath sounds normal.  Abd:  Soft, NT, ND, + BS Neurological: Pt is alert. Not confused , motor grossly intact Skin: Skin is warm. No rash Psychiatric: Pt behavior is +agitation, mild tremulous, shaky     Assessment & Plan:

## 2014-05-01 NOTE — Progress Notes (Signed)
Pre visit review using our clinic review tool, if applicable. No additional management support is needed unless otherwise documented below in the visit note. 

## 2014-05-01 NOTE — Telephone Encounter (Signed)
Patient has scheduled appointment today with PCP at 4 PM.

## 2014-05-01 NOTE — Telephone Encounter (Signed)
I am not convinced pt is symptomatic, unless she is taking more than indicated on recent messages.  I cannot support early refill due to medication diversion or misuse, as this is against the law.  Please make OV for fever illness, as i suspect this could be a significant problem, besides anxiety

## 2014-05-01 NOTE — Telephone Encounter (Signed)
Patient Information:  Caller Name: [name]  Phone: [phone]  Patient: ,   Gender: Female  DOB: 1945/10/20  Age: 68 Years  PCP: Cathlean Cower (Adults only)  Office Follow Up:  Does the office need to follow up with this patient?: Yes  Instructions For The Office: Please call and advise.   Symptoms  Reason For Call & Symptoms: Pts daughter/Kim is calling back - she wants an appt today because her Mom has the shakes and a fever now from Xanax withdrawl. Pt  takes 3 pills /day and took extra prior to the MRI ordered by Neurology on 9/18. Pt was so nervous she took 1 extra Xanax before the scan and 2 following that. And the same the next day. Her next refill is for 05/04/14. They did call the neurologist to explain what they had done but neurology advised she call her MD. Daughter states she has and Dr. Jenny Reichmann has refused. She is now asking if they can see another Provider today because her Mom "can't last like this". Temp is 99.1 but pt is shaking - onset last night. Pt is itching also. Daughter has given her Benadryl 25mg .  Reviewed Health History In EMR: Yes  Reviewed Medications In EMR: Yes  Reviewed Allergies In EMR: Yes  Reviewed Surgeries / Procedures: Yes  Date of Onset of Symptoms: 04/30/2014  Treatments Tried: Benadryl 25mg  x 1.  Treatments Tried Worked: No  Guideline(s) Used:  No Protocol Available - Sick Adult  Disposition Per Guideline:   See Today in Office  Reason For Disposition Reached:   Patient wants to be seen  Advice Given:  N/A  RN Overrode Recommendation:  Patient Requests Prescription  This pt is now requesting an office visit for withdrawl symptoms of Xanax that the pt took during a procedure and after for increased anxiety without the MD permission. She has been refused an early refill and is now symptomatic. Please advise. Does this pt need an appt?

## 2014-05-02 DIAGNOSIS — F1320 Sedative, hypnotic or anxiolytic dependence, uncomplicated: Secondary | ICD-10-CM | POA: Insufficient documentation

## 2014-05-02 NOTE — Assessment & Plan Note (Signed)
stable overall by history and exam, and pt to continue medical treatment as before,  to f/u any worsening symptoms or concerns 

## 2014-05-02 NOTE — Assessment & Plan Note (Signed)
For start paxil, gave bridge xanax to next refill date, will f/u at 4 wks to titrate paxil up if needed , and wean xanax, hopefully to off

## 2014-05-02 NOTE — Assessment & Plan Note (Signed)
stable overall by history and exam, recent data reviewed with pt, and pt to continue medical treatment as before,  to f/u any worsening symptoms or concerns SpO2 Readings from Last 3 Encounters:  05/01/14 95%  10/03/13 97%  05/16/13 98%

## 2014-05-02 NOTE — Assessment & Plan Note (Signed)
elev today likely reactive, daughter states home BP approx 140/90, o/w stable overall by history and exam, recent data reviewed with pt, and pt to continue medical treatment as before,  to f/u any worsening symptoms or concerns;  BP Readings from Last 3 Encounters:  05/01/14 190/100  03/20/14 162/82  10/03/13 152/100

## 2014-05-02 NOTE — Assessment & Plan Note (Signed)
For wean off with next visit,  to f/u any worsening symptoms or concerns

## 2014-05-16 ENCOUNTER — Encounter: Payer: Medicare Other | Admitting: Internal Medicine

## 2014-05-16 NOTE — Progress Notes (Signed)
Document opened and reviewed for SAT  OV but appt  canceled same day .

## 2014-05-25 ENCOUNTER — Ambulatory Visit (INDEPENDENT_AMBULATORY_CARE_PROVIDER_SITE_OTHER): Payer: Medicare Other | Admitting: Neurology

## 2014-05-25 VITALS — BP 115/67

## 2014-05-25 DIAGNOSIS — I639 Cerebral infarction, unspecified: Secondary | ICD-10-CM

## 2014-05-25 DIAGNOSIS — G478 Other sleep disorders: Secondary | ICD-10-CM | POA: Diagnosis not present

## 2014-05-25 DIAGNOSIS — G4720 Circadian rhythm sleep disorder, unspecified type: Secondary | ICD-10-CM

## 2014-05-25 DIAGNOSIS — R0683 Snoring: Secondary | ICD-10-CM

## 2014-05-25 DIAGNOSIS — G4761 Periodic limb movement disorder: Secondary | ICD-10-CM

## 2014-05-25 DIAGNOSIS — E663 Overweight: Secondary | ICD-10-CM

## 2014-05-25 NOTE — Sleep Study (Signed)
Please see the scanned sleep study interpretation located in the Procedure tab within the Chart Review section. 

## 2014-05-25 NOTE — Sleep Study (Deleted)
.  scannedsleepstudy

## 2014-06-02 ENCOUNTER — Ambulatory Visit: Payer: Medicare Other | Admitting: Internal Medicine

## 2014-06-05 ENCOUNTER — Telehealth: Payer: Self-pay | Admitting: Neurology

## 2014-06-05 NOTE — Telephone Encounter (Signed)
Please call and notify the patient that the recent sleep study did not show any significant obstructive sleep apnea. However, she did not sleep very well at all.  Please inform patient she can follow up with Dr. Jaynee Eagles as previously planned. We will fax a report to her PCP, route the report to Dr. Jaynee Eagles and send a copy to the patient for her records.  Once you have spoken to patient, you can close this encounter.   Thanks,  Star Age, MD, PhD [facility])

## 2014-06-08 NOTE — Telephone Encounter (Signed)
Patient was contacted and notified that her test results did not show any significant sleep apnea.  She was informed to follow up with Dr. Jaynee Eagles as planned.  Patient was mailed a copy of her test results and Dr. Jaynee Eagles was routed a copy.

## 2014-06-16 ENCOUNTER — Ambulatory Visit (INDEPENDENT_AMBULATORY_CARE_PROVIDER_SITE_OTHER): Payer: Medicare Other | Admitting: Neurology

## 2014-06-16 ENCOUNTER — Encounter: Payer: Self-pay | Admitting: Neurology

## 2014-06-16 VITALS — BP 137/69 | HR 55 | Ht 65.0 in | Wt 160.6 lb

## 2014-06-16 DIAGNOSIS — F32A Depression, unspecified: Secondary | ICD-10-CM

## 2014-06-16 DIAGNOSIS — R4189 Other symptoms and signs involving cognitive functions and awareness: Secondary | ICD-10-CM | POA: Diagnosis not present

## 2014-06-16 DIAGNOSIS — I639 Cerebral infarction, unspecified: Secondary | ICD-10-CM | POA: Diagnosis not present

## 2014-06-16 DIAGNOSIS — F329 Major depressive disorder, single episode, unspecified: Secondary | ICD-10-CM

## 2014-06-16 DIAGNOSIS — R4689 Other symptoms and signs involving appearance and behavior: Principal | ICD-10-CM

## 2014-06-16 DIAGNOSIS — F919 Conduct disorder, unspecified: Secondary | ICD-10-CM

## 2014-06-16 MED ORDER — DIAZEPAM 10 MG PO TABS: ORAL_TABLET | ORAL | Status: DC

## 2014-06-16 NOTE — Progress Notes (Signed)
[facility]     Expand All Collapse All      [facility]    Provider: Dr Jaynee Eagles Referring Provider: Biagio Borg, MD Primary Care Physician: Cathlean Cower, MD  CC: Visual difficulties and difficulty processing information  HPI: Stacy Brennan is a 68 y.o. female here for follow up on cognitive and visual changes. She has a PMHx of HTN, Stroke, Severe Stage Glaucoma and senile cataract who is here for evaluation of vision problems. Accompanied by daughter again today. Patient was here in August with daughter and at that time daughter reported that patient was having difficulty seeing for example she thought a paper towel was a salad. Been going on for at least a year and there is forgetfulness. Mom is sometimes going the opposite way to stores. Memory symptoms progressing. Daughter says there is no change. Mother can't find the car. Patient has lost interest in going to some events. Mother won't go to daughter's house unless daughter encourages. Mom says it is because she can't see. Patient has some depression because she had to move in with daughter. Patient misses her church but she can't get to her church now cause it is too far away. Moved in 4 years ago and that loss hasn't resolved. +sadness, loss of interest, loss of concentration. Goes to bed around 1:30am and Per daughter has insomnia and doesn't go to sleep until 2-5am. +snoring. No headache. +excessive tiredness during the day.   Since August, EEG was negative, Carotids completed and showed 50-69% stenosis, sleep study did not show OSA or PLMS.  She was unable to get into the MRI machine due to anxiety.   Review of notes from [facility]: VA OD 20/200, VA OS 20/200, VA OU 20/150. She has severe stage glaucoma, senile cataract    Review of Systems: Patient complains of symptoms per HPI as well as the following symptoms activity change, eye discharge, insomnia and all other  reviewed systems are negative.       History   Social History  . Marital Status: Single    Spouse Name: N/A    Number of Children: 4  . Years of Education: 12   Occupational History  . [place] History Main Topics  . Smoking status: Former Smoker    Quit date: 03/20/2002  . Smokeless tobacco: Current User    Types: Snuff  . Alcohol Use: No  . Drug Use: No  . Sexual Activity: Not on file   Other Topics Concern  . Not on file   Social History Narrative   Patient is single with 4 children.   Patient is right handed.   Patient has a high school education.   Patient drinks 1 cup a week.    Family History  Problem Relation Age of Onset  . Cancer Other     prostate cancer  . Hypertension Other   . Diabetes Other   . Thyroid disease    . Hyperlipidemia    . Parkinsonism Neg Hx   . Dementia Neg Hx     Past Medical History  Diagnosis Date  . 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 Hypertension   . Glaucoma   . Anxiety state, unspecified 01/01/2014  . Other and unspecified hyperlipidemia 01/01/2014  . Renal insufficiency 01/01/2014  . Allergic rhinitis, cause unspecified 01/01/2014  . Unspecified asthma(493.90) 01/01/2014  . Depression 01/01/2014  . Vision decreased   . Cataract  Past Surgical History  Procedure Laterality Date  . Dilation and curettage of uterus    . Tubalization      Current Outpatient Prescriptions  Medication Sig Dispense Refill  . ALPRAZolam (XANAX) 1 MG tablet TAKE 1 TABLET(S) BY MOUTH THREE TIMES DAILY- ok to fill now 9 tablet 0  . cholecalciferol (VITAMIN D) 1000 UNITS tablet Take 1,000 Units by mouth every morning.    . clopidogrel (PLAVIX) 75 MG tablet Take 75 mg by mouth every morning.    . labetalol (NORMODYNE) 200 MG tablet Take 1 tablet (200 mg total) by mouth 2 (two) times daily. 60 tablet 6  . latanoprost (XALATAN) 0.005 % ophthalmic solution Place 1 drop into both eyes at bedtime.    Marland Kitchen levocetirizine (XYZAL) 5 MG tablet  Take 1 tablet (5 mg total) by mouth daily. 30 tablet 6  . Multiple Vitamin (MULTIVITAMIN WITH MINERALS) TABS tablet Take 1 tablet by mouth every morning.    . Olopatadine HCl (PATADAY) 0.2 % SOLN Apply to eye.    Marland Kitchen PARoxetine (PAXIL) 10 MG tablet Take 1 tablet (10 mg total) by mouth daily. 90 tablet 3  . spironolactone (ALDACTONE) 25 MG tablet Take 1 tablet (25 mg total) by mouth every morning. 30 tablet 10  . verapamil (CALAN-SR) 240 MG CR tablet Take 1 tablet (240 mg total) by mouth at bedtime. 30 tablet 11  . zafirlukast (ACCOLATE) 10 MG tablet Take 0.5 tablets (5 mg total) by mouth 2 (two) times daily. 60 tablet 1  . diazepam (VALIUM) 10 MG tablet Take one hour before MRI. If needed may repeat before getting into scanner. 10 tablet 0   No current facility-administered medications for this visit.    Allergies as of 06/16/2014  . (No Known Allergies)    Vitals: BP 137/69 mmHg  Pulse 55  Ht 5\' 5"  (1.651 m)  Wt 160 lb 9.6 oz (72.848 kg)  BMI 26.73 kg/m2 Last Weight:  Wt Readings from Last 1 Encounters:  06/16/14 160 lb 9.6 oz (72.848 kg)   Last Height:   Ht Readings from Last 1 Encounters:  06/16/14 5\' 5"  (1.651 m)    Physical exam: Exam: Gen: NAD, conversant Eyes: anicteric sclerae, moist conjunctivae CV: RRR, no MRG  Neuro:    Speech:  Speech is normal; fluent and spontaneous with normal comprehension.   Cognition:  The patient is oriented to person, place, and time; Decreased vision prevented [place] from completing the [facility], MMSE 15/30 last in August. Today MMSE 18/30 -3 orientation, -6 attention and calculation -1 recall -2 language  Cranial Nerves:  The pupils are equal.. Could not visualize the fundi. Visual fields are full to finger waving. Extraocular movements are intact. Right 20/200, left intact to light and waving fingers. Trigeminal sensation is intact and the muscles of mastication are normal. The face is symmetric. The palate elevates in the midline.  Voice is normal. Shoulder shrug is normal. The tongue has normal motion without fasciculations.   Coordination:  Normal finger to nose and heel to shin.   Gait:  Good strides and arm swing.  Motor Observation:  No tremor   Tone:  Normal muscle tone.   Posture:  Posture is normal.  Strength:  Strength is V/V in the upper and lower limbs.     Light Touch:  Normal light touch sensation in upper and lower extremities.    Cortical Sensory Modalities:  No neglect   Reflex Exam:  DTR's:  Deep tendon reflexes are brisk in  the upper and lower extremities  Toes:  The toes are mute Clonus:  Clonus is absent.  Other Reflexes: Negative glabellar and snout  Assessment: 68 year old female with a PMHx of HTN, Stroke, Severe Stage Glaucoma and senile cataract who is here for evaluation of vision problems. Accompanied by daughter. Per daughter, patient is having difficulty seeing but the main concern is forgetfulness and progressive memory problems with insomnia, decreased concentration and loss of interest, sadness over having to sell her home and move in with daughter, insomnia and erratic sleeping schedule, snoring, excessive tiredness during the day. Neuro exam showed VA of 20/200 right eye and left eye able to only see light and object such as waving fingers, brisk reflexes, negative frontal release signs glabellar and snout. Since August, EEG was negative, Carotids completed and showed 50-69% stenosis, sleep study did not show OSA or PLMS.  She was unable to get into the MRI machine due to anxiety.   Plan:   Cognition difficulties: Decreased visual acuity made it difficult to perform MOCA. MMSE 15/30 in August and 18/30 today. Differential could be dementia vs pseudodementia possibly due to depression. Poor sleep.  Given history of stroke in 2002 will order MRI of the brain to evaluate for possibly continuing cerebrovascular events or other  pathology. Will change to open MRI and give patient Diazepam for anxiety.   Depression: Encouraged daughter to follow up with a therapist and/or psychiatrist. Will discuss again after workup. We can also consider neuropsychiatric testing at a later time.  Stroke: Will order an MRI of the brain in open MRI. Needs to follow closely with primary care to decrease stroke risks such as tight control of HTN, Cholesterol and glucose. Continue Plavix.   Snoring, excessive fatigue daytime, insomnia: Sleep study did not show OSA or PLMS.  Episodic confusion: Will order an EEG - was normal  Vision difficulties: Patient's vision is decreased due to severe-stage glaucoma and cataracts. Patient's difficulty seeing items and thinking a paper towel is salad is most likely due to severely decreased visual acuity. No indication that she has hallucinations or delusions.   Sarina Ill, MD  [facility] [address] [place], [place] [address]  Phone [phone] Fax [phone]

## 2014-06-17 DIAGNOSIS — H4011X3 Primary open-angle glaucoma, severe stage: Secondary | ICD-10-CM | POA: Diagnosis not present

## 2014-06-24 ENCOUNTER — Other Ambulatory Visit: Payer: Self-pay | Admitting: Internal Medicine

## 2014-06-24 NOTE — Telephone Encounter (Signed)
Done hardcopy to [facility]  

## 2014-06-24 NOTE — Telephone Encounter (Signed)
Faxed hardcopy for alprazolam to [facility]

## 2014-07-01 ENCOUNTER — Other Ambulatory Visit: Payer: Self-pay

## 2014-07-01 MED ORDER — CLOPIDOGREL BISULFATE 75 MG PO TABS: 75.0000 mg | ORAL_TABLET | Freq: Every morning | ORAL | Status: DC

## 2014-07-10 ENCOUNTER — Other Ambulatory Visit: Payer: Self-pay | Admitting: Internal Medicine

## 2014-08-26 ENCOUNTER — Other Ambulatory Visit: Payer: Self-pay | Admitting: Internal Medicine

## 2014-08-27 NOTE — Telephone Encounter (Signed)
Done hardcopy to [facility]  

## 2014-08-27 NOTE — Telephone Encounter (Signed)
Faxed hardcopy for Alprazolam to [facility].

## 2014-09-08 ENCOUNTER — Other Ambulatory Visit: Payer: Self-pay | Admitting: Internal Medicine

## 2014-09-16 ENCOUNTER — Ambulatory Visit (INDEPENDENT_AMBULATORY_CARE_PROVIDER_SITE_OTHER): Payer: Medicare Other | Admitting: Internal Medicine

## 2014-09-16 VITALS — BP 160/80 | HR 64 | Temp 98.5°F | Wt 161.0 lb

## 2014-09-16 DIAGNOSIS — R05 Cough: Secondary | ICD-10-CM

## 2014-09-16 DIAGNOSIS — L600 Ingrowing nail: Secondary | ICD-10-CM | POA: Diagnosis not present

## 2014-09-16 DIAGNOSIS — R5383 Other fatigue: Secondary | ICD-10-CM | POA: Diagnosis not present

## 2014-09-16 DIAGNOSIS — I10 Essential (primary) hypertension: Secondary | ICD-10-CM

## 2014-09-16 DIAGNOSIS — R059 Cough, unspecified: Secondary | ICD-10-CM

## 2014-09-16 DIAGNOSIS — E785 Hyperlipidemia, unspecified: Secondary | ICD-10-CM | POA: Diagnosis not present

## 2014-09-16 MED ORDER — LEVOFLOXACIN 250 MG PO TABS: 250.0000 mg | ORAL_TABLET | Freq: Every day | ORAL | Status: DC

## 2014-09-16 MED ORDER — ALPRAZOLAM 1 MG PO TABS: 1.0000 mg | ORAL_TABLET | Freq: Three times a day (TID) | ORAL | Status: DC | PRN

## 2014-09-16 MED ORDER — HYDROCODONE-HOMATROPINE 5-1.5 MG/5ML PO SYRP: 5.0000 mL | ORAL_SOLUTION | Freq: Four times a day (QID) | ORAL | Status: DC | PRN

## 2014-09-16 MED ORDER — LEVOCETIRIZINE DIHYDROCHLORIDE 5 MG PO TABS: 5.0000 mg | ORAL_TABLET | Freq: Every day | ORAL | Status: DC

## 2014-09-16 MED ORDER — PAROXETINE HCL 10 MG PO TABS: 10.0000 mg | ORAL_TABLET | Freq: Every day | ORAL | Status: DC

## 2014-09-16 NOTE — Patient Instructions (Addendum)
Please take all new medication as prescribed - the antibiotic, and cough medicine  Please continue all other medications as before, and refills have been done if requested.  Please have the pharmacy call with any other refills you may need.  Please keep your appointments with your specialists as you may have planned  You will be contacted regarding the referral for: podiatry  Please go to the LAB in the Basement (turn left off the elevator) for the tests to be done at your convenience  You will be contacted by phone if any changes need to be made immediately.  Otherwise, you will receive a letter about your results with an explanation, but please check with MyChart first.  Please remember to sign up for MyChart if you have not done so, as this will be important to you in the future with finding out test results, communicating by private email, and scheduling acute appointments online when needed.  We will try to see if a physician at [facility] will accept as a new patient

## 2014-09-16 NOTE — Assessment & Plan Note (Signed)
C/w bronchitis vs pna, delcines cxr, for cough med, levaquin asd, ,  to f/u any worsening symptoms or concerns

## 2014-09-16 NOTE — Assessment & Plan Note (Signed)
Mild elev today, liekly reactive, o/w stable overall by history and exam, recent data reviewed with pt, and pt to continue medical treatment as before,  to f/u any worsening symptoms or concerns BP Readings from Last 3 Encounters:  09/16/14 160/80  06/16/14 137/69  05/26/14 115/67

## 2014-09-16 NOTE — Assessment & Plan Note (Signed)
stable overall by history and exam, recent data reviewed with pt, and pt to continue medical treatment as before,  to f/u any worsening symptoms or concerns No results found for: [facility] For f/u labs today

## 2014-09-16 NOTE — Progress Notes (Signed)
Subjective:    Patient ID: Stacy Brennan, female    DOB: 05-15-1946, 69 y.o.   MRN: [id number]  HPI  Here with acute onset mild to mod 2 wks ST, HA, general weakness and malaise, with prod cough greenish sputum, but Pt denies chest pain, increased sob or doe, wheezing, orthopnea, PND, increased LE swelling, palpitations, dizziness or syncope. Also,  Here with 2-3 days acute onset fever, facial pain, pressure, headache, general weakness and malaise,.  Due for bilat cataract out soon per Dr Nicki Reaper. Has ongoing fatigue even before recent illness, no recent lab, daugther requests labs.  Did have TSH normal in aug 2015. Denies worsening depressive symptoms, suicidal ideation, or panic; has ongoing anxiety, not increased recently. Needs xanax refill, paxil refill.  Pt denies chest pain, increased sob or doe, wheezing, orthopnea, PND, increased LE swelling, palpitations, dizziness or syncope.  Pt denies new neurological symptoms such as new headache, or facial or extremity weakness or numbness Dementia overall stable symptomaticallyt, and not assoc with behavioral changes such as hallucinations, paranoia, or agitation. Lost last rx for xanax, never filled  Requests change to [place] site as she lives close by. Also with recurring ingrown nails, needs referral to podiatry Past Medical History  Diagnosis Date  . 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 Hypertension   . Glaucoma   . Anxiety state, unspecified 01/01/2014  . Other and unspecified hyperlipidemia 01/01/2014  . Renal insufficiency 01/01/2014  . Allergic rhinitis, cause unspecified 01/01/2014  . Unspecified asthma(493.90) 01/01/2014  . Depression 01/01/2014  . Vision decreased   . Cataract    Past Surgical History  Procedure Laterality Date  . Dilation and curettage of uterus    . Tubalization      reports that she quit smoking about 12 years ago. Her smokeless tobacco use includes Snuff. She reports that she does not drink alcohol or use illicit  drugs. family history includes Cancer in her other; Diabetes in her other; Hyperlipidemia in an other family member; Hypertension in her other; Thyroid disease in an other family member. There is no history of Parkinsonism or Dementia. No Known Allergies Current Outpatient Prescriptions on File Prior to Visit  Medication Sig Dispense Refill  . ALPRAZolam (XANAX) 1 MG tablet Take 1 tablet (1 mg total) by mouth 3 (three) times daily as needed for anxiety. 90 tablet 1  . cholecalciferol (VITAMIN D) 1000 UNITS tablet Take 1,000 Units by mouth every morning.    . clopidogrel (PLAVIX) 75 MG tablet Take 1 tablet (75 mg total) by mouth every morning. 90 tablet 1  . diazepam (VALIUM) 10 MG tablet Take one hour before MRI. If needed may repeat before getting into scanner. 10 tablet 0  . labetalol (NORMODYNE) 200 MG tablet Take 1 tablet (200 mg total) by mouth 2 (two) times daily. 60 tablet 6  . latanoprost (XALATAN) 0.005 % ophthalmic solution Place 1 drop into both eyes at bedtime.    . Multiple Vitamin (MULTIVITAMIN WITH MINERALS) TABS tablet Take 1 tablet by mouth every morning.    . Olopatadine HCl (PATADAY) 0.2 % SOLN Apply to eye.    . spironolactone (ALDACTONE) 25 MG tablet Take 1 tablet (25 mg total) by mouth every morning. 30 tablet 10  . verapamil (CALAN-SR) 240 MG CR tablet Take 1 tablet (240 mg total) by mouth at bedtime. 30 tablet 11  . zafirlukast (ACCOLATE) 10 MG tablet TAKE 0.5 TABLETS (5 MG TOTAL) BY MOUTH 2 (TWO) TIMES DAILY.  30 tablet 0   No current facility-administered medications on file prior to visit.     Review of Systems  Constitutional: Negative for unusual diaphoresis or other sweats  HENT: Negative for ringing in ear Eyes: Negative for double vision or worsening visual disturbance.  Respiratory: Negative for choking and stridor.   Gastrointestinal: Negative for vomiting or other signifcant bowel change Genitourinary: Negative for hematuria or decreased urine volume.   Musculoskeletal: Negative for other MSK pain or swelling Skin: Negative for color change and worsening wound.  Neurological: Negative for tremors and numbness other than noted  Psychiatric/Behavioral: Negative for decreased concentration or agitation other than above       Objective:   Physical Exam BP 160/80 mmHg  Pulse 64  Temp(Src) 98.5 F (36.9 C) (Oral)  Wt 161 lb (73.029 kg) VS noted, mild ill  Constitutional: Pt appears well-developed, well-nourished.  HENT: Head: NCAT.  Right Ear: External ear normal.  Left Ear: External ear normal.  Bilat tm's with mild erythema.  Max sinus areas mild tender.  Pharynx with mild erythema, no exudate Eyes: . Pupils are equal, round, and reactive to light. Conjunctivae and EOM are normal Neck: Normal range of motion. Neck supple.  Cardiovascular: Normal rate and regular rhythm.   Pulmonary/Chest: Effort normal and breath sounds somewhat decrased, but without rales or wheezing.  Abd:  Soft, NT, ND, + BS Neurological: Pt is alert. Not confused , motor grossly intact Skin: Skin is warm. No rash Psychiatric: Pt behavior is normal. No agitation.     Assessment & Plan:

## 2014-09-16 NOTE — Progress Notes (Signed)
Pre visit review using our clinic review tool, if applicable. No additional management support is needed unless otherwise documented below in the visit note. 

## 2014-09-16 NOTE — Assessment & Plan Note (Signed)
Etiology unclear, Exam otherwise benign, to check labs as documented, follow with expectant management  

## 2014-09-25 ENCOUNTER — Telehealth: Payer: Self-pay | Admitting: Internal Medicine

## 2014-09-25 NOTE — Telephone Encounter (Signed)
Got patient on wait list for Stacy Brennan at [facility] per Dr. Jenny Reichmann.

## 2014-10-08 DIAGNOSIS — L603 Nail dystrophy: Secondary | ICD-10-CM | POA: Diagnosis not present

## 2014-10-08 DIAGNOSIS — I739 Peripheral vascular disease, unspecified: Secondary | ICD-10-CM | POA: Diagnosis not present

## 2014-10-09 ENCOUNTER — Telehealth: Payer: Self-pay

## 2014-10-12 DIAGNOSIS — H2513 Age-related nuclear cataract, bilateral: Secondary | ICD-10-CM | POA: Diagnosis not present

## 2014-10-12 DIAGNOSIS — H4011X4 Primary open-angle glaucoma, indeterminate stage: Secondary | ICD-10-CM | POA: Diagnosis not present

## 2014-10-12 NOTE — Telephone Encounter (Signed)
No fup call rec'd

## 2014-10-23 ENCOUNTER — Other Ambulatory Visit: Payer: Self-pay | Admitting: Internal Medicine

## 2014-10-27 ENCOUNTER — Telehealth: Payer: Self-pay

## 2014-10-27 NOTE — Telephone Encounter (Signed)
Stacy Brennan is asking for a refill for patient's alprazolam 1 mg tablets. She last received a RX in 08/2014 with 1 refill. If you can print and sign prescription, I will send it to the pharmacy. Thanks.

## 2014-10-27 NOTE — Telephone Encounter (Signed)
Too soon for xanax, since the original rx feb 17 was for 90 with 1 refill (should be ok until at least apr 15)  Sorry, no early refills

## 2014-10-30 ENCOUNTER — Telehealth: Payer: Self-pay | Admitting: *Deleted

## 2014-10-30 MED ORDER — ALPRAZOLAM 1 MG PO TABS: 1.0000 mg | ORAL_TABLET | Freq: Three times a day (TID) | ORAL | Status: DC | PRN

## 2014-10-30 NOTE — Telephone Encounter (Signed)
Done

## 2014-10-30 NOTE — Telephone Encounter (Signed)
Received fax pt is wanting rfill on her alprazolam 1 mg...Johny Chess

## 2014-10-30 NOTE — Telephone Encounter (Signed)
Ok, but no early refills  Will refill for apr 16 when due

## 2014-11-03 ENCOUNTER — Other Ambulatory Visit: Payer: Self-pay | Admitting: Internal Medicine

## 2014-11-09 DIAGNOSIS — H4011X4 Primary open-angle glaucoma, indeterminate stage: Secondary | ICD-10-CM | POA: Diagnosis not present

## 2014-11-09 DIAGNOSIS — H401322 Pigmentary glaucoma, left eye, moderate stage: Secondary | ICD-10-CM | POA: Diagnosis not present

## 2014-11-09 DIAGNOSIS — H2512 Age-related nuclear cataract, left eye: Secondary | ICD-10-CM | POA: Diagnosis not present

## 2014-11-30 ENCOUNTER — Other Ambulatory Visit: Payer: Self-pay | Admitting: Internal Medicine

## 2014-12-07 DIAGNOSIS — H401312 Pigmentary glaucoma, right eye, moderate stage: Secondary | ICD-10-CM | POA: Diagnosis not present

## 2014-12-07 DIAGNOSIS — H2511 Age-related nuclear cataract, right eye: Secondary | ICD-10-CM | POA: Diagnosis not present

## 2014-12-07 DIAGNOSIS — H4011X3 Primary open-angle glaucoma, severe stage: Secondary | ICD-10-CM | POA: Diagnosis not present

## 2014-12-10 ENCOUNTER — Telehealth: Payer: Self-pay

## 2014-12-10 DIAGNOSIS — Z1231 Encounter for screening mammogram for malignant neoplasm of breast: Secondary | ICD-10-CM

## 2014-12-10 NOTE — Telephone Encounter (Signed)
Called patient, no answer on cell phone, called home number and spoke with daughter Stacy Brennan that handles affairs. Per daughter, no recent mammogram and ok with ordering/schedule appointment for scan.

## 2014-12-21 ENCOUNTER — Encounter: Payer: Self-pay | Admitting: Adult Health

## 2014-12-21 ENCOUNTER — Ambulatory Visit (INDEPENDENT_AMBULATORY_CARE_PROVIDER_SITE_OTHER): Payer: Medicare Other | Admitting: Adult Health

## 2014-12-21 VITALS — BP 128/80 | Temp 98.1°F | Ht 65.0 in | Wt 156.8 lb

## 2014-12-21 DIAGNOSIS — Z780 Asymptomatic menopausal state: Secondary | ICD-10-CM | POA: Diagnosis not present

## 2014-12-21 DIAGNOSIS — F32A Depression, unspecified: Secondary | ICD-10-CM

## 2014-12-21 DIAGNOSIS — F329 Major depressive disorder, single episode, unspecified: Secondary | ICD-10-CM

## 2014-12-21 DIAGNOSIS — Z7189 Other specified counseling: Secondary | ICD-10-CM

## 2014-12-21 DIAGNOSIS — Z7689 Persons encountering health services in other specified circumstances: Secondary | ICD-10-CM

## 2014-12-21 DIAGNOSIS — Z23 Encounter for immunization: Secondary | ICD-10-CM

## 2014-12-21 DIAGNOSIS — J329 Chronic sinusitis, unspecified: Secondary | ICD-10-CM

## 2014-12-21 DIAGNOSIS — E2839 Other primary ovarian failure: Secondary | ICD-10-CM | POA: Diagnosis not present

## 2014-12-21 DIAGNOSIS — I10 Essential (primary) hypertension: Secondary | ICD-10-CM

## 2014-12-21 DIAGNOSIS — R4189 Other symptoms and signs involving cognitive functions and awareness: Secondary | ICD-10-CM

## 2014-12-21 NOTE — Patient Instructions (Signed)
Please follow up with me in a week or two for blood work. We need to get you in with some specialists and this blood work will help [place] do that. It was great meeting you today and i am glad to be working with you. I will see you soon.

## 2014-12-21 NOTE — Progress Notes (Signed)
Pre visit review using our clinic review tool, if applicable. No additional management support is needed unless otherwise documented below in the visit note. 

## 2014-12-21 NOTE — Progress Notes (Signed)
HPI:  Stacy Brennan is here to establish care. She is a very pleasant and slightly confused AA female. Her daughter is with her at this appointment.  Last PCP and physical: 2015 with MD Jenny Reichmann.   Has the following chronic problems that require follow up and concerns today:  Unintentional Weight Loss Her daughter reports that the patient has had a unintentional weight loss of approximately 25-30 pounds over the last year and a half. She also reports that the patient does not eat throughout the day may be has 1 meal per day consisting of a lot of processed foods and sweets. Patient denies this states that she does like fruits and vegetables and the occasional sweets.   Depression - Daughter reports that the patient goes to bed around 2 AM and sleeps till about 2 PM multiple days throughout the week the patient does not leave her bed except to get up and have a snack her to use the restroom. The patient denies any depressive-like symptoms. When asked point blank if she has any loss of interest in doing things that she once enjoyed, the patient is unable to answer, as she easily gets off topic and does not want to discuss this issue. When redirection is attempted patient denies any depression   Confusion -What is most concerning during this initial visit, is the patient's confusion or cognitive impairment. Oftentimes throughout the exam she will have flight of ideas, we'll talk about items that are irreverent to the question asked. For example when asked about depression she states that she does not like the meat because it gets stuck in her teeth. Daughter reports that this has been going on over a year and she slowly been declining, she is first noticed to be more confused approximately 1 year ago when she is tripped and fell on a cement block while getting out of the car. It is unclear if the patient had her head during this fall. 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 He has not had a fall since  then. Patient doesn't endorse that she finds herself being more confused as of late. Daughter reports that they did see neurology about a year ago on the CT was ordered patient could not finish CT due to claustrophobia, they never followed up with  neurology after that.  Sinus Issues - Per daughter and patient patient suffers from chronic sinus issues. This is been going on for "forever". Ager symptom is rhinorrhea and postnasal drip. Currently taking any allergy medication. As tried Zyrtec and Flonase in the past. Did not notice any significant improvement. Daughter would like follow-up with ENT   ROS negative for unless reported above: fevers, chills,feeling poorly, hearing loss, chest pain, palpitations, leg claudication, struggling to breath,Not feeling congested in the chest, no orthopenia, no cough,no wheezing,  no soft tissue swelling, no hemoptysis, melena, hematochezia, hematuria, falls, loc, si, or thoughts of self harm.  Immunizations: Does not want any. Diet: Patient states that she does not exercise Exercise: Daughter does not think that she eats well. Lots of sweets.  Colonoscopy: Never had one- does not want one.  Dexa:  We will schedule Mammogram:We will schedule.  Past Medical History  Diagnosis Date  . 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 Hypertension   . Glaucoma   . Anxiety state, unspecified 01/01/2014  . Other and unspecified hyperlipidemia 01/01/2014  . Renal insufficiency 01/01/2014  . Allergic rhinitis, cause unspecified 01/01/2014  . Unspecified asthma(493.90)  01/01/2014  . Depression 01/01/2014  . Vision decreased   . Cataract     Past Surgical History  Procedure Laterality Date  . Dilation and curettage of uterus    . Tubalization      Family History  Problem Relation Age of Onset  . Cancer Other     prostate cancer  . Hypertension Other   . Diabetes Other   . Thyroid disease    . Hyperlipidemia    . Parkinsonism Neg Hx   . Dementia Neg Hx   . Hypertension    .  Glaucoma      History   Social History  . Marital Status: Single    Spouse Name: N/A  . Number of Children: 4  . Years of Education: 12   Occupational History  . [place] History Main Topics  . Smoking status: Former Smoker    Quit date: 03/20/2002  . Smokeless tobacco: Current User    Types: Snuff  . Alcohol Use: No  . Drug Use: No  . Sexual Activity: Not on file   Other Topics Concern  . None   Social History Narrative   Patient is single with 4 children.   Patient is right handed.   Patient has a high school education.   Patient drinks 1 cup a week.        Current outpatient prescriptions:  .  ALPRAZolam (XANAX) 1 MG tablet, Take 1 tablet (1 mg total) by mouth 3 (three) times daily as needed for anxiety. To fill Nov 14, 2014, since last rx for feb 17 was for #90 with one refill, Disp: 90 tablet, Rfl: 1 .  cholecalciferol (VITAMIN D) 1000 UNITS tablet, Take 1,000 Units by mouth every morning., Disp: , Rfl:  .  clopidogrel (PLAVIX) 75 MG tablet, Take 1 tablet (75 mg total) by mouth every morning., Disp: 90 tablet, Rfl: 1 .  labetalol (NORMODYNE) 200 MG tablet, TAKE 1 TABLET BY MOUTH TWICE DAILY, Disp: 60 tablet, Rfl: 1 .  Multiple Vitamin (MULTIVITAMIN WITH MINERALS) TABS tablet, Take 1 tablet by mouth every morning., Disp: , Rfl:  .  spironolactone (ALDACTONE) 25 MG tablet, TAKE 1 TABLET (25 MG TOTAL) BY MOUTH EVERY MORNING., Disp: 30 tablet, Rfl: 5 .  travoprost, benzalkonium, (TRAVATAN) 0.004 % ophthalmic solution, 1 drop at bedtime., Disp: , Rfl:  .  verapamil (CALAN-SR) 240 MG CR tablet, Take 1 tablet (240 mg total) by mouth at bedtime., Disp: 30 tablet, Rfl: 11  EXAM:  Filed Vitals:   12/21/14 1411  BP: 128/80  Temp: 98.1 F (36.7 C)    Body mass index is 26.09 kg/(m^2).  GENERAL: vitals reviewed and listed above, alert, oriented , appears well hydrated and in no acute distress. She is confused at time and is often going off topic when  engaged in conversation. She has flight of ideas during much of the exam.   HEENT: atraumatic, conjunttiva clear, no obvious abnormalities on inspection of external nose and ears  NECK: Neck is soft and supple without masses, no adenopathy or thyromegaly, trachea midline, no JVD. Normal range of motion.   LUNGS: clear to auscultation bilaterally, no wheezes, rales or rhonchi, good air movement  CV: Regular rate and rhythm, normal S1/S2, no audible murmurs, gallops, or rubs. No carotid bruit and no peripheral edema.   MS: moves all extremities without noticeable abnormality. No edema noted. She is able to get out of chair and onto exam table without no  assistance.   Abd: soft/nontender/nondistended/normal bowel sounds   Skin: warm and dry, no rash   Extremities: No clubbing, cyanosis, or edema. Capillary refill is WNL. Pulses intact bilaterally in upper and lower extremities.   Neuro: CN II-XII intact, sensation and reflexes normal throughout, 5/5 muscle strength in bilateral upper and lower extremities. Normal finger to nose.  No pronator drift.   PSYCH: pleasant and cooperative, appears depressed on some instances but will not admit to it and will not asnwer questions pertaining to depression. Is confused and has flight of ideas.   ASSESSMENT AND PLAN:  Discussed the following assessment and plan:  Post-menopausal  Estrogen deficiency - Plan: DG Bone Density  Need for tetanus booster - Plan: Tdap vaccine greater than or equal to 7yo IM  Encounter to establish care  Cognitive impairment  Chronic sinusitis, unspecified location  Depression -We reviewed the PMH, PSH, FH, SH, Meds and Allergies. -We provided refills for any medications we will prescribe as needed. -We addressed current concerns per orders and patient instructions. -We have advised patient to follow up per instructions below. -Patient advised to return or notify a provider immediately if symptoms worsen or  persist or new concerns arise.  Patient Instructions  Please follow up with me in a week or two for blood work. We need to get you in with some specialists and this blood work will help [place] do that. It was great meeting you today and i am glad to be working with you. I will see you soon.      [employer]

## 2014-12-21 NOTE — Assessment & Plan Note (Signed)
BP today in office is BP: 128/80 mmHg . Will continue with current regimen.

## 2014-12-21 NOTE — Assessment & Plan Note (Signed)
Will bring patient back in one week to get labs and discuss issues at hand.   She is unwilling to do any type of vaccinations or preventative care at this time. Her response for most things is "I'll do it next time". I found that taking a hard line I was able to convince her to take a tetanus shot during this visit, we will continue to take this approach during all other visits.

## 2014-12-21 NOTE — Assessment & Plan Note (Signed)
Will get blood work during next visit. Will do MOCA and PHQ 9 during next visit and refer to Neurology.

## 2014-12-21 NOTE — Assessment & Plan Note (Signed)
Unwilling to answer questions at this time. Will attempt depression screen during next visit.

## 2015-01-05 ENCOUNTER — Other Ambulatory Visit: Payer: Self-pay | Admitting: Internal Medicine

## 2015-01-05 ENCOUNTER — Ambulatory Visit: Payer: Medicare Other | Admitting: Adult Health

## 2015-01-07 NOTE — Telephone Encounter (Signed)
Spoke with pt's daughter and she is aware of recommendations.  She would like 1 month supply sent to pharmacy and she states this can be discussed futrhter at appt on 6.10.2016

## 2015-01-07 NOTE — Telephone Encounter (Signed)
This is not a medication that I am going to keep prescribing, especially in the geriatric population. I can give her a one months supply but she needs to start tapering off of it or follow up with psychiatry.

## 2015-01-08 ENCOUNTER — Encounter: Payer: Self-pay | Admitting: Adult Health

## 2015-01-08 ENCOUNTER — Ambulatory Visit (INDEPENDENT_AMBULATORY_CARE_PROVIDER_SITE_OTHER): Payer: Medicare Other | Admitting: Adult Health

## 2015-01-08 VITALS — BP 120/64 | Temp 98.3°F | Ht 65.0 in | Wt 159.9 lb

## 2015-01-08 DIAGNOSIS — I10 Essential (primary) hypertension: Secondary | ICD-10-CM | POA: Diagnosis not present

## 2015-01-08 DIAGNOSIS — R4189 Other symptoms and signs involving cognitive functions and awareness: Secondary | ICD-10-CM | POA: Diagnosis not present

## 2015-01-08 DIAGNOSIS — Z0000 Encounter for general adult medical examination without abnormal findings: Secondary | ICD-10-CM

## 2015-01-08 DIAGNOSIS — J321 Chronic frontal sinusitis: Secondary | ICD-10-CM

## 2015-01-08 DIAGNOSIS — E785 Hyperlipidemia, unspecified: Secondary | ICD-10-CM

## 2015-01-08 DIAGNOSIS — Z23 Encounter for immunization: Secondary | ICD-10-CM

## 2015-01-08 MED ORDER — IPRATROPIUM BROMIDE 0.03 % NA SOLN: 2.0000 | Freq: Two times a day (BID) | NASAL | Status: DC

## 2015-01-08 NOTE — Patient Instructions (Signed)
Please come back on Monday or Tuesday for your blood work  I have sent a prescription to the pharmacy for a nasal spray. Please use this twice a day as directed. You can also spray a normal saline spray in your nose 10 minutes before using.   Take over the counter zyrtec for your runny nose.   Follow up with me in 3 months.

## 2015-01-08 NOTE — Progress Notes (Addendum)
Subjective:    Patient ID: Stacy Brennan, female    DOB: [birth year], 69 y.o.   MRN: [id number]  HPI   Stacy Brennan is here for a wellness visit. I last saw her on 12/21/2014 where she establish care. Her last physical was in 2015 with Dr. Dolphus Brennan daughter is with her today at this appointment as she was during last appointment.   The patient has a complaint of chronic sinusitis and rhinorrhea. Patient is unsure of how long this has been going on all she says is "it's been going on a long time". She has tried steroid nasal sprays in the patch which has caused her to have a nosebleed. She has also tried Claritin as well in the past, says that this does not work. Most recently she has tried Xyzal 5 mg also without any relief. She continues to endorse occasional frontal maxillary sinus pressure as well as rhinorrhea. Denies any other upper respiratory type symptoms.  Her daughter continues to be concerned about the patient's cognitive status. Per the daughter patient seems depressed, as she lays in bed till about 1 or 2 PM. She will not go outside, she has a loss of appetite.the daughter also believes that the patient continuously repeats herself she gets side tracked during conversation. With the daughter in the room the patient denies any type of depression. Her daughter would like a referral for neurology in order to get her cognitive status evaluated.  I also spoke to the patient about her Xanax usage patient is taking three 1 mg pills per day for anxiety and on occasion she takes an additional 0.5 mg for severe anxiety. I advised the patient that I do not prescribe this medication especially in the geriatric population due to its side effects and addictive properties. She was counseled on the side effects, she was told that we would wean her off Xanax or she could go see a psychiatrist, but that I was not going to  continue to write for this medication. She was receptive to the idea of weaning off  Xanax  Review of Systems  Constitutional: Positive for appetite change (eats fruits and vegetables but does not eat a lot of meat because "they get stuck in my teeth"). Negative for fever, activity change, fatigue and unexpected weight change.  HENT: Positive for congestion, postnasal drip, rhinorrhea and sinus pressure. Negative for ear discharge, ear pain, sore throat and trouble swallowing.   Eyes: Positive for discharge (endorses tearing. No mucopurulent discharge).  Respiratory: Negative.   Cardiovascular: Negative.   Gastrointestinal: Negative.   Endocrine: Negative.   Genitourinary: Negative.   Musculoskeletal: Negative.   Skin: Negative.   Allergic/Immunologic: Negative.   Neurological: Negative.   Hematological: Negative.   Psychiatric/Behavioral: Positive for sleep disturbance (she endorses going up at about 1 or 2 AM and waking up approximately 1 or 2 PM). Negative for behavioral problems, confusion, decreased concentration and agitation.  All other systems reviewed and are negative.  Past Medical History  Diagnosis Date  . 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 Hypertension   . Glaucoma   . Anxiety state, unspecified 01/01/2014  . Other and unspecified hyperlipidemia 01/01/2014  . Renal insufficiency 01/01/2014  . Allergic rhinitis, cause unspecified 01/01/2014  . Unspecified asthma(493.90) 01/01/2014  . Depression 01/01/2014  . Vision decreased   . Cataract     History   Social History  . Marital Status: Single    Spouse Name: N/A  . Number  of Children: 4  . Years of Education: 12   Occupational History  . [place] History Main Topics  . Smoking status: Former Smoker    Quit date: 03/20/2002  . Smokeless tobacco: Current User    Types: Snuff  . Alcohol Use: No  . Drug Use: No  . Sexual Activity: Not on file   Other Topics Concern  . Not on file   Social History Narrative   Patient is single with 4 children.   Patient is right handed.   Patient has a  high school education.   Patient drinks 1 cup a week.       Past Surgical History  Procedure Laterality Date  . Dilation and curettage of uterus    . Tubalization      Family History  Problem Relation Age of Onset  . Cancer Other     prostate cancer  . Hypertension Other   . Diabetes Other   . Thyroid disease    . Hyperlipidemia    . Parkinsonism Neg Hx   . Dementia Neg Hx   . Hypertension    . Glaucoma      No Known Allergies  Current Outpatient Prescriptions on File Prior to Visit  Medication Sig Dispense Refill  . ALPRAZolam (XANAX) 1 MG tablet TAKE 1 TABLET BY MOUTH THREE TIMES DAILY AS NEEDED FOR ANXIETY 90 tablet 0  . cholecalciferol (VITAMIN D) 1000 UNITS tablet Take 1,000 Units by mouth every morning.    . clopidogrel (PLAVIX) 75 MG tablet Take 1 tablet (75 mg total) by mouth every morning. 90 tablet 1  . labetalol (NORMODYNE) 200 MG tablet TAKE 1 TABLET BY MOUTH TWICE DAILY 60 tablet 1  . Multiple Vitamin (MULTIVITAMIN WITH MINERALS) TABS tablet Take 1 tablet by mouth every morning.    Marland Kitchen spironolactone (ALDACTONE) 25 MG tablet TAKE 1 TABLET (25 MG TOTAL) BY MOUTH EVERY MORNING. 30 tablet 5  . travoprost, benzalkonium, (TRAVATAN) 0.004 % ophthalmic solution 1 drop at bedtime.    . verapamil (CALAN-SR) 240 MG CR tablet Take 1 tablet (240 mg total) by mouth at bedtime. 30 tablet 11   No current facility-administered medications on file prior to visit.    BP 120/64 mmHg  Temp(Src) 98.3 F (36.8 C) (Oral)  Ht 5\' 5"  (1.651 m)  Wt 159 lb 14.4 oz (72.53 kg)  BMI 26.61 kg/m2       Objective:   Physical Exam  Constitutional: She is oriented to person, place, and time. She appears well-developed and well-nourished. No distress.  HENT:  Head: Normocephalic and atraumatic.  Right Ear: External ear normal.  Left Ear: External ear normal.  Nose: Nose normal.  Mouth/Throat: Oropharynx is clear and moist. No oropharyngeal exudate.  Eyes: Conjunctivae and EOM are  normal. Pupils are equal, round, and reactive to light. Right eye exhibits discharge. Left eye exhibits discharge (clear discharge). No scleral icterus.  Recent cataract surgery. Has trouble seeing   Neck: Normal range of motion. Neck supple. No thyromegaly present.  Cardiovascular: Normal rate, normal heart sounds and intact distal pulses.  Exam reveals no gallop and no friction rub.   No murmur heard. Pulmonary/Chest: Effort normal and breath sounds normal. No respiratory distress. She has no wheezes. She has no rales. She exhibits no tenderness.  Abdominal: Soft. Bowel sounds are normal. She exhibits no distension and no mass. There is no tenderness. There is no rebound and no guarding.  Musculoskeletal: Normal range of motion.  She exhibits no edema or tenderness.  Lymphadenopathy:    She has no cervical adenopathy.  Neurological: She is alert and oriented to person, place, and time. She has normal reflexes. No cranial nerve deficit. Coordination normal.  Skin: Skin is warm and dry. No rash noted. She is not diaphoretic. No erythema. No pallor.  Psychiatric: She has a normal mood and affect. Her behavior is normal. Judgment and thought content normal.  Scored a 3 on the PHQ 9 questionnaire which relates to the minimal depression. No SI  Her Mini-Mental status exam had to be tailored due to her recent cataract surgery she was unable to complete the reading writing and copying portion of the test. She was unable to spell the word "world" backwards. All the rest of the question she did perfectly.  I have noticed during exam that she often gets off topic has flight of ideas and mostly concentrates on not be able to eat meat because it "gets stuck in her teeth". He is alert and oriented 3 and is able to answer questions appropriately  Nursing note and vitals reviewed.      Assessment & Plan:  1. Essential hypertension - EKG 12-Lead - Sinus Bradycardia, rate 50 - Basic metabolic panel -  Hepatic function panel - Lipid panel - POCT urinalysis dipstick - TSH - CBC with Differential/Platelet - Appears well controlled on current medication - no change  2. Chronic frontal sinusitis - Ambulatory referral to ENT - ipratropium (ATROVENT) 0.03 % nasal spray; Place 2 sprays into both nostrils every 12 (twelve) hours.  Dispense: 30 mL; Refill: 12 - OTC Zyrtec - Can use saline spray 10 minutes prior to Atrovent to help with dryness  3. Hyperlipidemia - Lipid panel  4. Need for pneumococcal vaccination - Pneumococcal conjugate vaccine 13-valent IM  5. Routine general medical examination at a health care facility - Follow up in three months or sooner if needed - Continue to eat a healthy diet.  - Educated the importance of daily exercise even if it is just walking outside.  - She should not be in bed for 12 hours a day. She needs to get out of bed after 8 hours.  -Time spent with patient was 45 min of which greater than 50% was spent counseling and/or coordinating care on patients declining cognitive status and other above issues.    6. Cognitive impairment - Ambulatory referral to Neurology - Basic metabolic panel - Hepatic function panel - POCT urinalysis dipstick - TSH - CBC with Differential/Platelet

## 2015-01-08 NOTE — Progress Notes (Signed)
Pre visit review using our clinic review tool, if applicable. No additional management support is needed unless otherwise documented below in the visit note. 

## 2015-01-12 ENCOUNTER — Other Ambulatory Visit: Payer: Medicare Other

## 2015-01-12 DIAGNOSIS — I10 Essential (primary) hypertension: Secondary | ICD-10-CM | POA: Diagnosis not present

## 2015-01-12 DIAGNOSIS — E785 Hyperlipidemia, unspecified: Secondary | ICD-10-CM | POA: Diagnosis not present

## 2015-01-12 DIAGNOSIS — R4189 Other symptoms and signs involving cognitive functions and awareness: Secondary | ICD-10-CM | POA: Diagnosis not present

## 2015-01-12 LAB — CBC WITH DIFFERENTIAL/PLATELET
Basophils Absolute: 0 10*3/uL (ref 0.0–0.1)
Basophils Relative: 0.5 % (ref 0.0–3.0)
Eosinophils Absolute: 0.2 10*3/uL (ref 0.0–0.7)
Eosinophils Relative: 3.3 % (ref 0.0–5.0)
HCT: 40.6 % (ref 36.0–46.0)
Hemoglobin: 13.6 g/dL (ref 12.0–15.0)
Lymphocytes Relative: 44.2 % (ref 12.0–46.0)
Lymphs Abs: 2.6 10*3/uL (ref 0.7–4.0)
MCHC: 33.5 g/dL (ref 30.0–36.0)
MCV: 90 fl (ref 78.0–100.0)
MONO ABS: 0.5 10*3/uL (ref 0.1–1.0)
Monocytes Relative: 8 % (ref 3.0–12.0)
NEUTROS ABS: 2.6 10*3/uL (ref 1.4–7.7)
Neutrophils Relative %: 44 % (ref 43.0–77.0)
Platelets: 191 10*3/uL (ref 150.0–400.0)
RBC: 4.51 Mil/uL (ref 3.87–5.11)
RDW: 13 % (ref 11.5–15.5)
WBC: 5.8 10*3/uL (ref 4.0–10.5)

## 2015-01-12 LAB — BASIC METABOLIC PANEL
BUN: 14 mg/dL (ref 6–23)
CALCIUM: 10.3 mg/dL (ref 8.4–10.5)
CO2: 30 mEq/L (ref 19–32)
Chloride: 104 mEq/L (ref 96–112)
Creatinine, Ser: 1.37 mg/dL — ABNORMAL HIGH (ref 0.40–1.20)
GFR: 49.05 mL/min — AB (ref >60.00)
Glucose, Bld: 84 mg/dL (ref 70–99)
POTASSIUM: 3.9 meq/L (ref 3.5–5.1)
SODIUM: 141 meq/L (ref 135–145)

## 2015-01-12 LAB — LIPID PANEL
CHOL/HDL RATIO: 4
Cholesterol: 233 mg/dL — ABNORMAL HIGH (ref 0–200)
HDL: 64.6 mg/dL (ref >39.00)
LDL Cholesterol: 146 mg/dL — ABNORMAL HIGH (ref 0–99)
NONHDL: 168.4
Triglycerides: 114 mg/dL (ref 0.0–149.0)
VLDL: 22.8 mg/dL (ref 0.0–40.0)

## 2015-01-12 LAB — HEPATIC FUNCTION PANEL
ALT: 37 U/L — ABNORMAL HIGH (ref 0–35)
AST: 29 U/L (ref 0–37)
Albumin: 4.5 g/dL (ref 3.5–5.2)
Alkaline Phosphatase: 76 U/L (ref 39–117)
Bilirubin, Direct: 0 mg/dL (ref 0.0–0.3)
Total Bilirubin: 0.6 mg/dL (ref 0.2–1.2)
Total Protein: 7.3 g/dL (ref 6.0–8.3)

## 2015-01-12 LAB — TSH: TSH: 4.26 u[IU]/mL (ref 0.35–4.50)

## 2015-01-13 ENCOUNTER — Telehealth: Payer: Self-pay | Admitting: Adult Health

## 2015-01-13 ENCOUNTER — Other Ambulatory Visit: Payer: Self-pay | Admitting: Adult Health

## 2015-01-13 MED ORDER — ATORVASTATIN CALCIUM 10 MG PO TABS: 10.0000 mg | ORAL_TABLET | Freq: Every day | ORAL | Status: DC

## 2015-01-13 NOTE — Telephone Encounter (Signed)
Spoke to [employer] (patient's daughter) about labs. Notified her that the cholesterol was still high. Will start on 10 mg of Lipitor. Advised of adverse reactions and to stop medication if any are present.

## 2015-01-20 ENCOUNTER — Ambulatory Visit
Admission: RE | Admit: 2015-01-20 | Discharge: 2015-01-20 | Disposition: A | Discharge status: Home / Self Care | Payer: Medicare Other | Source: Ambulatory Visit | Attending: Internal Medicine | Admitting: Internal Medicine

## 2015-01-20 ENCOUNTER — Ambulatory Visit
Admission: RE | Admit: 2015-01-20 | Discharge: 2015-01-20 | Disposition: A | Discharge status: Home / Self Care | Payer: Medicare Other | Source: Ambulatory Visit | Attending: Adult Health | Admitting: Adult Health

## 2015-01-20 DIAGNOSIS — E2839 Other primary ovarian failure: Secondary | ICD-10-CM

## 2015-01-20 DIAGNOSIS — Z1231 Encounter for screening mammogram for malignant neoplasm of breast: Secondary | ICD-10-CM | POA: Diagnosis not present

## 2015-01-20 DIAGNOSIS — M810 Age-related osteoporosis without current pathological fracture: Secondary | ICD-10-CM | POA: Diagnosis not present

## 2015-01-21 ENCOUNTER — Encounter: Payer: Self-pay | Admitting: Adult Health

## 2015-01-21 ENCOUNTER — Other Ambulatory Visit: Payer: Self-pay | Admitting: Adult Health

## 2015-01-21 DIAGNOSIS — M810 Age-related osteoporosis without current pathological fracture: Secondary | ICD-10-CM | POA: Insufficient documentation

## 2015-01-21 MED ORDER — ALENDRONATE SODIUM 70 MG PO TABS: 70.0000 mg | ORAL_TABLET | ORAL | Status: AC

## 2015-01-30 ENCOUNTER — Other Ambulatory Visit: Payer: Self-pay | Admitting: Internal Medicine

## 2015-02-04 ENCOUNTER — Telehealth: Payer: Self-pay | Admitting: Adult Health

## 2015-02-04 NOTE — Telephone Encounter (Signed)
Called and spoke with pt's daughter and she is aware of Stacy Brennan's recommendations.  Advised that if any of the symptoms of Calcium toxicity are seen to seek medical attention immediately.

## 2015-02-04 NOTE — Telephone Encounter (Signed)
Pls advise.  

## 2015-02-04 NOTE — Telephone Encounter (Signed)
Thank you for letting me know. She should be ok, common symptoms of Calcium toxicity are fatigue, depression,  muscle weakness and pain. I doubt she will have any of these though.

## 2015-02-04 NOTE — Telephone Encounter (Signed)
[facility]  Patient Name: Stacy Brennan  DOB: 29-Aug-1945    Initial Comment Caller states, mother was supposed to take an Rx once a week, she has taken them once daily, she has already spoken with poison control. She wants to let her dr's office know what is going on. Dr Dorothyann Peng   Nurse Assessment      Guidelines    Guideline Title Affirmed Question Affirmed Notes       Final Disposition User   Clinical Call Harlow Mares, RN, Rhonda    Comments  Caller states, mother was supposed to take an Rx (Fosamax once a week, she has taken them once daily for the last 4 days, she has already spoken with poison control. She wants to let her dr's office know what is going on. Dr Dorothyann Peng. Reports that patient is currently without symptoms and caller is wanting to inform MD of this in case he wants to check her Calcium level. Called office backline number and spoke with Elmo Putt, nurse and advised of the above. She advised nurse to document and said that she would bring this to MD's attention for follow up. Return phone number and caller's name also given.

## 2015-02-08 ENCOUNTER — Other Ambulatory Visit: Payer: Self-pay | Admitting: Adult Health

## 2015-02-09 MED ORDER — ALPRAZOLAM 0.5 MG PO TABS: 0.5000 mg | ORAL_TABLET | Freq: Two times a day (BID) | ORAL | Status: DC | PRN

## 2015-02-09 NOTE — Addendum Note (Signed)
Addended by: Colleen Can on: 02/09/2015 08:01 AM   Modules accepted: Orders

## 2015-02-10 ENCOUNTER — Telehealth: Payer: Self-pay | Admitting: Adult Health

## 2015-02-10 NOTE — Telephone Encounter (Signed)
Spoke with pharmacy and verified prescription.

## 2015-02-10 NOTE — Telephone Encounter (Signed)
Xanax rx was faxed and pharm needs verification

## 2015-02-26 ENCOUNTER — Other Ambulatory Visit: Payer: Self-pay | Admitting: Internal Medicine

## 2015-03-07 ENCOUNTER — Other Ambulatory Visit: Payer: Self-pay | Admitting: Adult Health

## 2015-03-16 DIAGNOSIS — H4011X3 Primary open-angle glaucoma, severe stage: Secondary | ICD-10-CM | POA: Diagnosis not present

## 2015-03-24 ENCOUNTER — Other Ambulatory Visit: Payer: Self-pay | Admitting: Adult Health

## 2015-04-14 ENCOUNTER — Other Ambulatory Visit: Payer: Self-pay | Admitting: Adult Health

## 2015-04-14 ENCOUNTER — Encounter: Payer: Self-pay | Admitting: Adult Health

## 2015-04-14 ENCOUNTER — Ambulatory Visit (INDEPENDENT_AMBULATORY_CARE_PROVIDER_SITE_OTHER): Payer: Medicare Other | Admitting: Adult Health

## 2015-04-14 VITALS — BP 130/82 | Temp 98.0°F | Ht 65.0 in | Wt 158.2 lb

## 2015-04-14 DIAGNOSIS — R7989 Other specified abnormal findings of blood chemistry: Secondary | ICD-10-CM

## 2015-04-14 DIAGNOSIS — F411 Generalized anxiety disorder: Secondary | ICD-10-CM | POA: Diagnosis not present

## 2015-04-14 DIAGNOSIS — R946 Abnormal results of thyroid function studies: Secondary | ICD-10-CM | POA: Diagnosis not present

## 2015-04-14 DIAGNOSIS — E785 Hyperlipidemia, unspecified: Secondary | ICD-10-CM

## 2015-04-14 DIAGNOSIS — R4189 Other symptoms and signs involving cognitive functions and awareness: Secondary | ICD-10-CM | POA: Diagnosis not present

## 2015-04-14 LAB — BASIC METABOLIC PANEL
BUN: 14 mg/dL (ref 6–23)
CALCIUM: 9.9 mg/dL (ref 8.4–10.5)
CO2: 30 mEq/L (ref 19–32)
Chloride: 106 mEq/L (ref 96–112)
Creatinine, Ser: 1.15 mg/dL (ref 0.40–1.20)
GFR: 59.99 mL/min — AB (ref >60.00)
GLUCOSE: 73 mg/dL (ref 70–99)
POTASSIUM: 4.1 meq/L (ref 3.5–5.1)
Sodium: 143 mEq/L (ref 135–145)

## 2015-04-14 LAB — TSH: TSH: 1.22 u[IU]/mL (ref 0.35–4.50)

## 2015-04-14 MED ORDER — ALPRAZOLAM 0.25 MG PO TABS: ORAL_TABLET | ORAL | Status: DC

## 2015-04-14 MED ORDER — ALPRAZOLAM 0.25 MG PO TABS: 0.2500 mg | ORAL_TABLET | Freq: Two times a day (BID) | ORAL | Status: DC

## 2015-04-14 NOTE — Patient Instructions (Signed)
It was great seeing you again!  Someone will call you to schedule the appointment for the MRI  I will follow up with you about your thyroid test  Follow up in 3 months for a lab visit to check your cholesterol.

## 2015-04-14 NOTE — Progress Notes (Signed)
Subjective:    Patient ID: Stacy Brennan, female    DOB: April 03, 1946, 69 y.o.   MRN: [id number]  HPI  Pleasant 69 year old female who presents to the office today for follow up regarding multiple issues. Her youngest daughter accompanied her today.   1) Anxiety - She endorses doing well with her anxiety. We continue to taper her Xanax dosage down. Currently she is taking 0.5 mg BID. She feels as though she is controlled on this medication.   2) Her TSH level during the last visit was 4.26, although this is still WNL it has increased from 1.46 a year prior. Thyroid issues run in her family history. I would like to recheck her thyroid at this visit.   3) Mild Cognitive Impairment - During the last visit it was suggested that she see Neurology due to issues with confusion as family was concerned about this. She has seemed slightly confused at previous office visits. She never followed up with Neurology. She was also supposed to have MRI done last year, which was never completed either.   Review of Systems  Constitutional: Negative.   HENT: Negative.   Eyes: Negative.   Respiratory: Negative.   Cardiovascular: Negative.   Gastrointestinal: Negative.   Endocrine: Negative.   Genitourinary: Negative.   Musculoskeletal: Negative.   Skin: Negative.   Neurological: Negative.   Hematological: Negative.   Psychiatric/Behavioral: The patient is nervous/anxious.   All other systems reviewed and are negative.  Past Medical History  Diagnosis Date  . 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 Hypertension   . Glaucoma   . Anxiety state, unspecified 01/01/2014  . Other and unspecified hyperlipidemia 01/01/2014  . Renal insufficiency 01/01/2014  . Allergic rhinitis, cause unspecified 01/01/2014  . Unspecified asthma(493.90) 01/01/2014  . Depression 01/01/2014  . Vision decreased   . Cataract     Social History   Social History  . Marital Status: Single    Spouse Name: N/A  . Number of Children: 4  .  Years of Education: 12   Occupational History  . [place] History Main Topics  . Smoking status: Former Smoker    Quit date: 03/20/2002  . Smokeless tobacco: Current User    Types: Snuff  . Alcohol Use: No  . Drug Use: No  . Sexual Activity: Not on file   Other Topics Concern  . Not on file   Social History Narrative   Patient is single with 4 children.   Patient is right handed.   Patient has a high school education.   Patient drinks 1 cup a week.       Past Surgical History  Procedure Laterality Date  . Dilation and curettage of uterus    . Tubalization      Family History  Problem Relation Age of Onset  . Cancer Other     prostate cancer  . Hypertension Other   . Diabetes Other   . Thyroid disease    . Hyperlipidemia    . Parkinsonism Neg Hx   . Dementia Neg Hx   . Hypertension    . Glaucoma      No Known Allergies  Current Outpatient Prescriptions on File Prior to Visit  Medication Sig Dispense Refill  . alendronate (FOSAMAX) 70 MG tablet Take 1 tablet (70 mg total) by mouth once a week. Take with a full glass of water on an empty stomach. 4 tablet 11  .  atorvastatin (LIPITOR) 10 MG tablet Take 1 tablet (10 mg total) by mouth daily. 30 tablet 6  . cholecalciferol (VITAMIN D) 1000 UNITS tablet Take 1,000 Units by mouth every morning.    . clopidogrel (PLAVIX) 75 MG tablet Take 1 tablet (75 mg total) by mouth every morning. 90 tablet 1  . ipratropium (ATROVENT) 0.03 % nasal spray Place 2 sprays into both nostrils every 12 (twelve) hours. 30 mL 12  . labetalol (NORMODYNE) 200 MG tablet TAKE 1 TABLET BY MOUTH TWICE DAILY 60 tablet 1  . Multiple Vitamin (MULTIVITAMIN WITH MINERALS) TABS tablet Take 1 tablet by mouth every morning.    Marland Kitchen spironolactone (ALDACTONE) 25 MG tablet TAKE 1 TABLET (25 MG TOTAL) BY MOUTH EVERY MORNING. 30 tablet 5  . travoprost, benzalkonium, (TRAVATAN) 0.004 % ophthalmic solution 1 drop at bedtime.    . verapamil  (CALAN-SR) 240 MG CR tablet TAKE 1 TABLET BY MOUTH AT BEDTIME 30 tablet 0   No current facility-administered medications on file prior to visit.    BP 130/82 mmHg  Temp(Src) 98 F (36.7 C) (Oral)  Ht 5\' 5"  (1.651 m)  Wt 158 lb 3.2 oz (71.759 kg)  BMI 26.33 kg/m2       Objective:   Physical Exam  Constitutional: She is oriented to person, place, and time. She appears well-developed and well-nourished. No distress.  Neck: No thyromegaly present.  Cardiovascular: Normal rate, regular rhythm, normal heart sounds and intact distal pulses.  Exam reveals no gallop and no friction rub.   No murmur heard. Pulmonary/Chest: Effort normal and breath sounds normal. No respiratory distress. She has no wheezes. She has no rales. She exhibits no tenderness.  Musculoskeletal: Normal range of motion. She exhibits no edema or tenderness.  Neurological: She is alert and oriented to person, place, and time.  Skin: Skin is warm and dry. No rash noted. She is not diaphoretic. No erythema. No pallor.  Psychiatric: She has a normal mood and affect. Her behavior is normal. Judgment and thought content normal.  Nursing note and vitals reviewed.     Assessment & Plan:  1. Hyperlipidemia - Lipid panel; Future - Continue with Statin medication 2. Cognitive impairment - MR Brain W Wo Contrast; Future - Basic metabolic panel - Will refer to neurology after MRI  3. Anxiety state - ALPRAZolam (XANAX) 0.25 MG tablet; Take 1 tablet (0.25 mg total) by mouth 2 (two) times daily. TAKE 1 TABLET (0.5 MG TOTAL) BY MOUTH TWO TIMES DAILY AS NEEDED FOR ANXIETY  Dispense: 60 tablet; Refill: 0 - Continue to taper off medication. Only take one pill per day if able to tolerate.  - Follow up with any withdrawal symptoms.  4. Elevated TSH - TSH

## 2015-04-14 NOTE — Progress Notes (Signed)
Pre visit review using our clinic review tool, if applicable. No additional management support is needed unless otherwise documented below in the visit note. 

## 2015-04-15 NOTE — Progress Notes (Signed)
Pt notified of lab work.

## 2015-04-26 ENCOUNTER — Other Ambulatory Visit: Payer: Self-pay | Admitting: Internal Medicine

## 2015-04-27 NOTE — Telephone Encounter (Signed)
Dorothyann Peng, NP has not refilled these medications in the past. Routing back to your office.

## 2015-04-27 NOTE — Telephone Encounter (Signed)
Pt has transferred care to Dorothyann Peng, NP as of 12/21/14!! All medications refills are to be rx by new provider, Dorothyann Peng, NP.../lmb

## 2015-05-11 ENCOUNTER — Other Ambulatory Visit: Payer: Self-pay | Admitting: Adult Health

## 2015-05-11 NOTE — Telephone Encounter (Signed)
Ok to refill 

## 2015-05-18 ENCOUNTER — Telehealth: Payer: Self-pay | Admitting: Adult Health

## 2015-05-18 NOTE — Telephone Encounter (Signed)
Pt's daughter,  Roxan Hockey  called to advise that pt is having trouble sleeping at night and they think she needs a higher dose of xanax. [facility]/ [facility] [place] on [address]

## 2015-05-18 NOTE — Telephone Encounter (Signed)
Please advise 

## 2015-05-21 ENCOUNTER — Other Ambulatory Visit: Payer: Self-pay | Admitting: Adult Health

## 2015-05-21 ENCOUNTER — Telehealth: Payer: Self-pay | Admitting: Adult Health

## 2015-05-21 MED ORDER — TRAZODONE HCL 50 MG PO TABS: 25.0000 mg | ORAL_TABLET | Freq: Every evening | ORAL | Status: DC | PRN

## 2015-05-21 NOTE — Telephone Encounter (Signed)
Spoke to daughter(Kimmie) on the phone. Her mother has not been sleeping lately. She has been giving her mother three pills(xanax) to help her sleep at night.   I advised Kimmie that I would prefer she not taken any additional xanax. We will try Tramadol 25-50mg  QHS for sleep.

## 2015-06-07 ENCOUNTER — Other Ambulatory Visit: Payer: Self-pay | Admitting: Adult Health

## 2015-06-07 ENCOUNTER — Telehealth: Payer: Self-pay | Admitting: Adult Health

## 2015-06-07 MED ORDER — ALPRAZOLAM 0.5 MG PO TABS: 0.5000 mg | ORAL_TABLET | Freq: Two times a day (BID) | ORAL | Status: DC | PRN

## 2015-06-07 NOTE — Telephone Encounter (Signed)
Spoke to patient's daughter Casimer Lanius) on the phone. She was wondering if I would send in a prescription for Xanax for her mother. Per Kimmie " she hasn't been sleeping so I have had to give her three pills in the morning and three pills in the evening".  The last time I spoke to them I advised them on the dangers of having her mother take additional xanax and I had written a prescription for Trazadone. This has not been started yet for reasons that are unknown to this Probation officer. Per Kimmie " we are going to start the trazodone tonight."   I again educated Kimmie on the dangers of having her mother take Xanax in a way that was not prescribed. She was also warned that if her mother continued this that I was not going to write for Xanax anymore. I will write for [place] to have 0.5 mg Xanax in the morning and 0.5 in the afternoon. She is to NOT take xanax and trazodone together. I will be seeing them in December at which time I will take her down to 0.5 in the morning and 0.25 in the afternoon.

## 2015-06-07 NOTE — Telephone Encounter (Signed)
Called and spoke with pt's daugher Kimmie and she states that pt has not started the trazodone yet, tonight will be the first night.  Pt's daughter states that at last visit she told Tommi Rumps that her mother was taking 3 alprazolam .25 mg tablets at night to help her sleep.  Per daughter now pt is taking 3 tablets in the am and 3 tablets at night.  Pt has run out of medication.

## 2015-06-07 NOTE — Telephone Encounter (Signed)
Daughter call to say that she don't think the medicine is working traZODone (DESYREL) 50 MG tablet  Daughter request a call back

## 2015-06-09 ENCOUNTER — Other Ambulatory Visit: Payer: Self-pay | Admitting: Adult Health

## 2015-07-11 DIAGNOSIS — R251 Tremor, unspecified: Secondary | ICD-10-CM | POA: Diagnosis not present

## 2015-07-11 DIAGNOSIS — T887XXA Unspecified adverse effect of drug or medicament, initial encounter: Secondary | ICD-10-CM | POA: Diagnosis not present

## 2015-07-11 DIAGNOSIS — I6789 Other cerebrovascular disease: Secondary | ICD-10-CM | POA: Diagnosis not present

## 2015-07-11 DIAGNOSIS — T43211A Poisoning by selective serotonin and norepinephrine reuptake inhibitors, accidental (unintentional), initial encounter: Secondary | ICD-10-CM | POA: Diagnosis not present

## 2015-07-11 DIAGNOSIS — Z888 Allergy status to other drugs, medicaments and biological substances status: Secondary | ICD-10-CM | POA: Diagnosis not present

## 2015-07-12 ENCOUNTER — Other Ambulatory Visit: Payer: Self-pay | Admitting: Adult Health

## 2015-07-12 ENCOUNTER — Telehealth: Payer: Self-pay | Admitting: Adult Health

## 2015-07-12 MED ORDER — ALPRAZOLAM 0.5 MG PO TABS: 0.5000 mg | ORAL_TABLET | Freq: Every day | ORAL | Status: DC

## 2015-07-12 NOTE — Telephone Encounter (Signed)
Patient's daughter wanted doctor to know that the patient was in [facility] over the weekend because she took to much of her Travatan medication. She is completely out of her Xanax and she started taking the Travatan instead.  She's not resting because of being out of the Xanax.  They need at least 5 Xanax .55ml until the patient sees him on Dec 14th, because she can't go cold [place] on this medication. Daughter said to call ([phone]) if you need any clarification on anything.

## 2015-07-12 NOTE — Telephone Encounter (Signed)
Called and spoke to Ms. [name] daughter who explained that her mother over dosed on Trazadone over the weekend. She had run out of Xanax over the last week and was unable to sleep so she took Trazadone instead, a total of 7. She was seen in the ER and kept for 8 hours.   They are coming in on 12/14 for a follow up appointment.   I advised them that I would prescribe one month of Xanax for Stacy Brennan so that she would not go through withdrawals but after that, I would not be writing for anymore benzos. Advised to start weaning her off Xanax from this time forward

## 2015-07-13 ENCOUNTER — Telehealth: Payer: Self-pay | Admitting: *Deleted

## 2015-07-13 NOTE — Telephone Encounter (Signed)
Please address if refill approved and reach out to family about status   ----------------------------------------------------------------------------------------------------------------------- PLEASE NOTE: All timestamps contained within this report are represented as [place] Standard Time. CONFIDENTIALTY NOTICE: This fax transmission is intended only for the addressee. It contains information that is legally privileged, confidential or otherwise protected from use or disclosure. If you are not the intended recipient, you are strictly prohibited from reviewing, disclosing, copying using or disseminating any of this information or taking any action in reliance on or regarding this information. If you have received this fax in error, please notify us immediately by telephone so that we can arrange for its return to [place]. Phone: [phone], Toll-Free: [phone], Fax: [phone] Page: 1 of 2 Call Id: [id number] [facility] - Client [place] Patient Name: Stacy Brennan Gender: Female DOB: 01/20/1946 Age: 69 Y 3 M 14 D Return Phone Number: [phone] (Primary) Address: City/State/Zip:  [facility]  [facility] Client Client Site [facility] Contact Type Call Call Type Triage / [place] Name Patric Dykes Relationship To Patient Daughter Return Phone Number [phone] (Primary) Chief Complaint Prescription Refill or Medication Request (non symptomatic) Initial Comment Caller states Stacy Brennan cb# [phone], she is calling about a Prescription for her mother that was suppose to be sent to [facility] Stacy May [birth year] Dr Tommi Rumps PA called at 12:55 and called in a Prescription, but it is not there. Zanex No Triage Reason Patient declined Nurse Assessment Nurse: Jimmye Norman, RN, Museum/gallery conservator Date/Time (Eastern Time): 07/12/2015 6:33:28  PM Please select the assessment type ---RX called in but not at pharm Additional Documentation ---Caller states that her mother was supposed to have xanax called in but it is not at the pharmacy. She was told earlier today that the doctor was calling it in but it is not there. She has been out of the medication. Document the name of the medication. ---xanax Pharmacy name and phone number. ---Thomasena Edis Has the office closed within the last 30 minutes? ---No Does the client directives allow for assistance with medications after hours? ---No Nurse: Jimmye Norman, RN, Museum/gallery conservator Date/Time (Eastern Time): 07/12/2015 6:36:27 PM Confirm and document reason for call. If symptomatic, describe symptoms. ---Caller states that her mother is having symptoms because she is without her xanax. The patient was called and placed on the line as well to address the symptoms. Both the caller and patient state that it is not necessary because they already talked to the doctor today and they are upset because the medication was not called in. The patient does not want her symptoms addressed. Has the patient traveled out of the country within the last 30 days? ---Not Applicable Does the patient have any new or worsening symptoms? ---Yes Will a triage be completed? ---No Select reason for no triage. ---Patient declined PLEASE NOTE: All timestamps contained within this report are represented as [place] Standard Time. CONFIDENTIALTY NOTICE: This fax transmission is intended only for the addressee. It contains information that is legally privileged, confidential or otherwise protected from use or disclosure. If you are not the intended recipient, you are strictly prohibited from reviewing, disclosing, copying using or disseminating any of this information or taking any action in reliance on or regarding this information. If you have received this fax in error, please notify us immediately by telephone so that we can  arrange for its return to [place]. Phone: [phone], Toll-Free: [phone], Fax: [phone] Page: 2  of 2 Call Id: [id number] Nurse Assessment Please document clinical information provided and list any resource used. ---Caller advised to call the office back first thing in the morning because there is no physician on call to address the medication after hours. Caller advised too call back for new or worsening symptoms. Caller verbalized understanding and states she is upset the medication was not called in for her mother. Guidelines Guideline Title Affirmed Question Affirmed Notes Nurse Date/Time (Eastern Time) Disp. Time Eilene Ghazi Time) Disposition Final User 07/12/2015 6:18:44 PM Attempt made - message left Jimmye Norman RN, Amber 07/12/2015 6:40:20 PM Clinical Call Yes Jimmye Norman, RN, Amber After Care Instructions Given Call Event Type User Date / Time Description Comments User: Emeline Gins Date/Time Eilene Ghazi Time): 07/12/2015 6:30:03 PM Caller states she missed call from nurse. She will answer the next time.

## 2015-07-13 NOTE — Telephone Encounter (Signed)
Rx was faxed yesterday. Pt's daughter came into the office this morning and I told her.  She states the pharmacy did not receive it.  Pt's daughter was given the hard copy of the prescription and we will discuss this further at pt's appt on 12.14.2016.

## 2015-07-14 ENCOUNTER — Ambulatory Visit (INDEPENDENT_AMBULATORY_CARE_PROVIDER_SITE_OTHER): Payer: Medicare Other | Admitting: Adult Health

## 2015-07-14 ENCOUNTER — Encounter: Payer: Self-pay | Admitting: Adult Health

## 2015-07-14 ENCOUNTER — Other Ambulatory Visit: Payer: Medicare Other

## 2015-07-14 VITALS — Temp 97.8°F

## 2015-07-14 DIAGNOSIS — F411 Generalized anxiety disorder: Secondary | ICD-10-CM

## 2015-07-14 DIAGNOSIS — Z760 Encounter for issue of repeat prescription: Secondary | ICD-10-CM | POA: Diagnosis not present

## 2015-07-14 DIAGNOSIS — Z09 Encounter for follow-up examination after completed treatment for conditions other than malignant neoplasm: Secondary | ICD-10-CM

## 2015-07-14 MED ORDER — ATORVASTATIN CALCIUM 10 MG PO TABS: 10.0000 mg | ORAL_TABLET | Freq: Every day | ORAL | Status: DC

## 2015-07-14 MED ORDER — MIRTAZAPINE 7.5 MG PO TABS: 7.5000 mg | ORAL_TABLET | Freq: Every day | ORAL | Status: DC

## 2015-07-14 MED ORDER — ALPRAZOLAM 0.5 MG PO TABS: 0.5000 mg | ORAL_TABLET | Freq: Two times a day (BID) | ORAL | Status: DC

## 2015-07-14 MED ORDER — LABETALOL HCL 200 MG PO TABS: 200.0000 mg | ORAL_TABLET | Freq: Two times a day (BID) | ORAL | Status: DC

## 2015-07-14 NOTE — Patient Instructions (Signed)
It was great seeing you again today!  I am sorry you ended up in the ER, but I am glad you are feeling better.   Please work with a psychiatrist for the management of your anxiety.   I will follow up with you regarding something to help you sleep.   If you need anything in the meantime, please let me know.

## 2015-07-14 NOTE — Progress Notes (Addendum)
Subjective:    Patient ID: Stacy Brennan, female    DOB: [birth year], 69 y.o.   MRN: [id number]  HPI  69 year old female who presents to the office today with her two daughters for ER follow up. She was recently seen at [facility] in [place] to be evaluated for her mouth locking up, this episode lasted approx 10 minutes and had resolved by the time she arrived to the ER. Her daughters noted that Ms. Tye had been accidentally taking her Trazadone instead of of Xanax and that she had taken 4-5 pills.   Labs in the ER were normals as was head CT.   I have been trying to work with the family as well as Ms. Villatoro over the last 6-7 months in weaning her off Xanax. Attempts in the past have been met with resistance by Ms. Freestone and her daughters.    Currently, Ms. Cleckley feels " fine" since being discharged and has not had any dystonic events since.   Ms. Hawes does endorse that the Tramadol makes her vision blurry and would like to take something else to help her sleep. Since she has not been taking the Trazadone her vision has returned to Baseline.    Review of Systems  Constitutional: Negative.   Eyes: Negative.   Respiratory: Negative.   Cardiovascular: Negative.   Gastrointestinal: Negative.   Genitourinary: Negative.   Neurological: Negative.   Hematological: Negative.   Psychiatric/Behavioral: Negative.   All other systems reviewed and are negative.  Past Medical History  Diagnosis Date  . 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 Hypertension   . Glaucoma   . Anxiety state, unspecified 01/01/2014  . Other and unspecified hyperlipidemia 01/01/2014  . Renal insufficiency 01/01/2014  . Allergic rhinitis, cause unspecified 01/01/2014  . Unspecified asthma(493.90) 01/01/2014  . Depression 01/01/2014  . Vision decreased   . Cataract     Social History   Social History  . Marital Status: Single    Spouse Name: N/A  . Number of Children: 4  . Years of Education: 12   Occupational  History  . [place] History Main Topics  . Smoking status: Former Smoker    Quit date: 03/20/2002  . Smokeless tobacco: Current User    Types: Snuff  . Alcohol Use: No  . Drug Use: No  . Sexual Activity: Not on file   Other Topics Concern  . Not on file   Social History Narrative   Patient is single with 4 children.   Patient is right handed.   Patient has a high school education.   Patient drinks 1 cup a week.       Past Surgical History  Procedure Laterality Date  . Dilation and curettage of uterus    . Tubalization      Family History  Problem Relation Age of Onset  . Cancer Other     prostate cancer  . Hypertension Other   . Diabetes Other   . Thyroid disease    . Hyperlipidemia    . Parkinsonism Neg Hx   . Dementia Neg Hx   . Hypertension    . Glaucoma      No Known Allergies  Current Outpatient Prescriptions on File Prior to Visit  Medication Sig Dispense Refill  . alendronate (FOSAMAX) 70 MG tablet Take 1 tablet (70 mg total) by mouth once a week. Take with a full glass of water  on an empty stomach. 4 tablet 11  . ALPRAZolam (XANAX) 0.5 MG tablet Take 1 tablet (0.5 mg total) by mouth daily. 30 tablet 0  . atorvastatin (LIPITOR) 10 MG tablet Take 1 tablet (10 mg total) by mouth daily. 30 tablet 6  . cholecalciferol (VITAMIN D) 1000 UNITS tablet Take 1,000 Units by mouth every morning.    . clopidogrel (PLAVIX) 75 MG tablet Take 1 tablet (75 mg total) by mouth every morning. 90 tablet 1  . ipratropium (ATROVENT) 0.03 % nasal spray Place 2 sprays into both nostrils every 12 (twelve) hours. 30 mL 12  . labetalol (NORMODYNE) 200 MG tablet TAKE 1 TABLET BY MOUTH TWICE DAILY 60 tablet 3  . Multiple Vitamin (MULTIVITAMIN WITH MINERALS) TABS tablet Take 1 tablet by mouth every morning.    Marland Kitchen spironolactone (ALDACTONE) 25 MG tablet TAKE 1 TABLET (25 MG TOTAL) BY MOUTH EVERY MORNING. 30 tablet 3  . travoprost, benzalkonium, (TRAVATAN) 0.004 %  ophthalmic solution 1 drop at bedtime.    . traZODone (DESYREL) 50 MG tablet Take 0.5-1 tablets (25-50 mg total) by mouth at bedtime as needed for sleep. 30 tablet 3  . verapamil (CALAN-SR) 240 MG CR tablet TAKE 1 TABLET BY MOUTH AT BEDTIME 30 tablet 0   No current facility-administered medications on file prior to visit.    Temp(Src) 97.8 F (36.6 C) (Oral)       Objective:   Physical Exam  Constitutional: She is oriented to person, place, and time. She appears well-developed and well-nourished. No distress.  Cardiovascular: Normal rate, regular rhythm, normal heart sounds and intact distal pulses.  Exam reveals no gallop and no friction rub.   No murmur heard. Pulmonary/Chest: Effort normal and breath sounds normal. No respiratory distress. She has no wheezes. She has no rales. She exhibits no tenderness.  Neurological: She is alert and oriented to person, place, and time.  Skin: Skin is warm and dry. No rash noted. She is not diaphoretic. No erythema. No pallor.  Psychiatric: She has a normal mood and affect. Her behavior is normal. Judgment and thought content normal.  Nursing note and vitals reviewed.     Assessment & Plan:  1. Follow up - Ms. Leib and her daughters were advised that I no longer feel comfortable prescribing her Xanax due to possible adverse reactions like somnolence, falls, and broken bones.  I will not let her go through withdraw so I will write her for for xanax during the next two months. They are going to follow up with psychiatry so that they can manage Ms. Dantuono anxiety. Family is ok with this plan and they seem to understand. Advised that she is not to take any additional pills.   2. Generalized anxiety disorder - Will help with sleep, and increase appetite as well.  - mirtazapine (REMERON) 7.5 MG tablet; Take 1 tablet (7.5 mg total) by mouth at bedtime.  Dispense: 30 tablet; Refill: 3 - D/C Trazadone 3. Medication refill - atorvastatin (LIPITOR) 10 MG  tablet; Take 1 tablet (10 mg total) by mouth daily.  Dispense: 90 tablet; Refill: 3 - labetalol (NORMODYNE) 200 MG tablet; Take 1 tablet (200 mg total) by mouth 2 (two) times daily.  Dispense: 180 tablet; Refill: 3

## 2015-07-14 NOTE — Addendum Note (Signed)
Addended by: Apolinar Junes on: 07/14/2015 03:52 PM   Modules accepted: Orders, Medications

## 2015-07-14 NOTE — Addendum Note (Signed)
Addended by: Colleen Can on: 07/14/2015 04:51 PM   Modules accepted: Orders

## 2015-08-16 DIAGNOSIS — H401133 Primary open-angle glaucoma, bilateral, severe stage: Secondary | ICD-10-CM | POA: Diagnosis not present

## 2015-09-03 ENCOUNTER — Telehealth: Payer: Self-pay | Admitting: Adult Health

## 2015-09-03 MED ORDER — ALPRAZOLAM 0.5 MG PO TABS: 0.5000 mg | ORAL_TABLET | Freq: Two times a day (BID) | ORAL | Status: DC

## 2015-09-03 NOTE — Telephone Encounter (Signed)
Daughter calling to see if Stacy Brennan will fill pt's  ALPRAZolam Stacy Brennan) 0.5 MG tablet  Daughter realizes this will be last refill of this med. [facility] [employer]

## 2015-09-03 NOTE — Telephone Encounter (Signed)
Rx called in to pharmacy. Spoke with pt's daughter Casimer Lanius and she is aware.

## 2015-09-03 NOTE — Telephone Encounter (Signed)
Ok to refill for one month  

## 2015-09-20 DIAGNOSIS — Z961 Presence of intraocular lens: Secondary | ICD-10-CM | POA: Diagnosis not present

## 2015-09-20 DIAGNOSIS — H401133 Primary open-angle glaucoma, bilateral, severe stage: Secondary | ICD-10-CM | POA: Diagnosis not present

## 2015-09-20 DIAGNOSIS — H18413 Arcus senilis, bilateral: Secondary | ICD-10-CM | POA: Diagnosis not present

## 2015-09-20 DIAGNOSIS — I10 Essential (primary) hypertension: Secondary | ICD-10-CM | POA: Diagnosis not present

## 2015-10-05 DIAGNOSIS — I10 Essential (primary) hypertension: Secondary | ICD-10-CM | POA: Diagnosis not present

## 2015-10-05 DIAGNOSIS — F419 Anxiety disorder, unspecified: Secondary | ICD-10-CM | POA: Diagnosis not present

## 2015-12-14 DIAGNOSIS — H401133 Primary open-angle glaucoma, bilateral, severe stage: Secondary | ICD-10-CM | POA: Diagnosis not present

## 2016-02-21 ENCOUNTER — Other Ambulatory Visit: Payer: Self-pay | Admitting: Internal Medicine

## 2016-02-21 ENCOUNTER — Other Ambulatory Visit: Payer: Self-pay | Admitting: Adult Health

## 2016-02-21 DIAGNOSIS — Z760 Encounter for issue of repeat prescription: Secondary | ICD-10-CM

## 2016-02-22 ENCOUNTER — Other Ambulatory Visit: Payer: Self-pay | Admitting: Emergency Medicine

## 2016-02-22 NOTE — Telephone Encounter (Signed)
Patient last seen 07/14/15 - last refilled 07/14/15 for #180 with 3 refills.

## 2016-03-27 DIAGNOSIS — F419 Anxiety disorder, unspecified: Secondary | ICD-10-CM | POA: Diagnosis not present

## 2016-03-27 DIAGNOSIS — M810 Age-related osteoporosis without current pathological fracture: Secondary | ICD-10-CM | POA: Diagnosis not present

## 2016-03-27 DIAGNOSIS — J3089 Other allergic rhinitis: Secondary | ICD-10-CM | POA: Diagnosis not present

## 2016-03-27 DIAGNOSIS — I10 Essential (primary) hypertension: Secondary | ICD-10-CM | POA: Diagnosis not present

## 2016-04-04 ENCOUNTER — Other Ambulatory Visit: Payer: Self-pay

## 2016-04-21 DIAGNOSIS — L603 Nail dystrophy: Secondary | ICD-10-CM | POA: Diagnosis not present

## 2016-04-21 DIAGNOSIS — I739 Peripheral vascular disease, unspecified: Secondary | ICD-10-CM | POA: Diagnosis not present

## 2016-05-17 ENCOUNTER — Telehealth: Payer: Self-pay | Admitting: Adult Health

## 2016-05-17 NOTE — Telephone Encounter (Signed)
Pt has appt with CN 05/18/16 @ 2:30pm.

## 2016-05-17 NOTE — Telephone Encounter (Signed)
[facility] - Client [place] Patient Name: Stacy Brennan DOB: November 18, 1945 Initial Comment Caller states mother ran into bed, thinks she broke middle toe. Nurse Assessment Nurse: Martyn Ehrich, RN, Felicia Date/Time (Eastern Time): 05/17/2016 3:01:12 PM Confirm and document reason for call. If symptomatic, describe symptoms. You must click the next button to save text entered. ---PT ran into bed this am early and hurt her L middle toe. PT is at another location - dtr added her to call Has the patient traveled out of the country within the last 30 days? ---No Does the patient have any new or worsening symptoms? ---Yes Will a triage be completed? ---Yes Related visit to physician within the last 2 weeks? ---No Does the PT have any chronic conditions? (i.e. diabetes, asthma, etc.) ---NoIs this a behavioral health or substance abuse call? ---No Guidelines Guideline Title Affirmed Question Affirmed Notes Toe Injury Looks like a broken bone (e.g., crooked or deformed) Final Disposition User See Physician within 4 Hours (or PCP triage) Martyn Ehrich, RN, Felicia Comments caller wants to say they will go to [facility] tonight and DO want to KEEP a previously made appt for tomorrow Referrals [facility] Disagree/Comply: Comply Call Id: [id number]

## 2016-05-18 ENCOUNTER — Ambulatory Visit (INDEPENDENT_AMBULATORY_CARE_PROVIDER_SITE_OTHER): Payer: Medicare Other | Admitting: Adult Health

## 2016-05-18 ENCOUNTER — Encounter: Payer: Self-pay | Admitting: Adult Health

## 2016-05-18 VITALS — BP 130/62 | Ht 65.0 in | Wt 160.8 lb

## 2016-05-18 DIAGNOSIS — Z23 Encounter for immunization: Secondary | ICD-10-CM | POA: Diagnosis not present

## 2016-05-18 DIAGNOSIS — M79675 Pain in left toe(s): Secondary | ICD-10-CM

## 2016-05-18 NOTE — Addendum Note (Signed)
Addended by: Sandria Bales B on: 05/18/2016 03:55 PM   Modules accepted: Orders

## 2016-05-18 NOTE — Progress Notes (Signed)
Subjective:    Patient ID: Stacy Brennan, female    DOB: 01/08/1946, 70 y.o.   MRN: [id number]  HPI  70 year old female who presents to the office today for left sided toe pain. She reports " someone stepped on them and I think I broke them." She is referring to toe 2 and 3 on her left foot. She has been using motrin to help control the pain. She is able to ambulate but endorses pain   Review of Systems  Constitutional: Negative.   Musculoskeletal: Positive for joint swelling.  Skin: Negative.   All other systems reviewed and are negative.  Past Medical History:  Diagnosis Date  . Allergic rhinitis, cause unspecified 01/01/2014  . Anxiety state, unspecified 01/01/2014  . Cataract   . Depression 01/01/2014  . Glaucoma   . Hypertension   . Other and unspecified hyperlipidemia 01/01/2014  . Renal insufficiency 01/01/2014  . Stroke [facility]) 2002   CVA x 2  . Unspecified asthma(493.90) 01/01/2014  . Vision decreased     Social History   Social History  . Marital status: Single    Spouse name: N/A  . Number of children: 4  . Years of education: 12   Occupational History  . [place] History Main Topics  . Smoking status: Former Smoker    Quit date: 03/20/2002  . Smokeless tobacco: Current User    Types: Snuff  . Alcohol use No  . Drug use: No  . Sexual activity: Not on file   Other Topics Concern  . Not on file   Social History Narrative   Patient is single with 4 children.   Patient is right handed.   Patient has a high school education.   Patient drinks 1 cup a week.       Past Surgical History:  Procedure Laterality Date  . DILATION AND CURETTAGE OF UTERUS    . [place]      Family History  Problem Relation Age of Onset  . Cancer Other     prostate cancer  . Hypertension Other   . Diabetes Other   . Thyroid disease    . Hyperlipidemia    . Parkinsonism Neg Hx   . Dementia Neg Hx   . Hypertension    . Glaucoma      No Known  Allergies  Current Outpatient Prescriptions on File Prior to Visit  Medication Sig Dispense Refill  . alendronate (FOSAMAX) 70 MG tablet Take 1 tablet (70 mg total) by mouth once a week. Take with a full glass of water on an empty stomach. 4 tablet 11  . ALPRAZolam (XANAX) 0.5 MG tablet Take 1 tablet (0.5 mg total) by mouth 2 (two) times daily. 60 tablet 0  . atorvastatin (LIPITOR) 10 MG tablet Take 1 tablet (10 mg total) by mouth daily. 90 tablet 3  . cholecalciferol (VITAMIN D) 1000 UNITS tablet Take 1,000 Units by mouth every morning.    . clopidogrel (PLAVIX) 75 MG tablet Take 1 tablet (75 mg total) by mouth every morning. 90 tablet 1  . ipratropium (ATROVENT) 0.03 % nasal spray Place 2 sprays into both nostrils every 12 (twelve) hours. 30 mL 12  . labetalol (NORMODYNE) 200 MG tablet TAKE 1 TABLET BY MOUTH TWICE DAILY 60 tablet 2  . Multiple Vitamin (MULTIVITAMIN WITH MINERALS) TABS tablet Take 1 tablet by mouth every morning.    Marland Kitchen spironolactone (ALDACTONE) 25 MG tablet TAKE 1 TABLET (25 MG  TOTAL) BY MOUTH EVERY MORNING. 30 tablet 3  . travoprost, benzalkonium, (TRAVATAN) 0.004 % ophthalmic solution 1 drop at bedtime.    . verapamil (CALAN-SR) 240 MG CR tablet TAKE 1 TABLET BY MOUTH AT BEDTIME 30 tablet 9   No current facility-administered medications on file prior to visit.     BP 130/62   Ht 5\' 5"  (1.651 m)   Wt 160 lb 12.8 oz (72.9 kg)   BMI 26.76 kg/m       Objective:   Physical Exam  Constitutional: She is oriented to person, place, and time. She appears well-developed and well-nourished. No distress.  Musculoskeletal: Normal range of motion. She exhibits tenderness. She exhibits no deformity.  Tenderness to PIP joint in toe 2 and 3 on left foot. Trace swelling. No bruising noted. She has no loss of ROM. Good cap refill   Neurological: She is alert and oriented to person, place, and time.  Skin: Skin is warm and dry. No rash noted. She is not diaphoretic. No erythema. No  pallor.  Psychiatric: She has a normal mood and affect. Her behavior is normal. Judgment and thought content normal.  Nursing note and vitals reviewed.     Assessment & Plan:  1. Toe pain, left - Possible fracture but more likely bone bruise.  - Buddy wrapped toes and placed in surgical shoe. She reports improvement  - Motrin 600mg  Q8H PRN and ice  - Will forgo x ray at this time as it will not change treatment  - Follow up if no improvement  Dorothyann Peng, NP

## 2016-06-07 ENCOUNTER — Telehealth: Payer: Self-pay

## 2016-06-07 NOTE — Telephone Encounter (Signed)
NUMBER IS DISCONNECTED  SHAY 06-07-16

## 2016-06-13 ENCOUNTER — Ambulatory Visit (INDEPENDENT_AMBULATORY_CARE_PROVIDER_SITE_OTHER): Payer: Medicare Other | Admitting: Adult Health

## 2016-06-13 ENCOUNTER — Encounter: Payer: Self-pay | Admitting: Adult Health

## 2016-06-13 VITALS — BP 120/64 | Temp 98.6°F | Ht 65.0 in | Wt 161.6 lb

## 2016-06-13 DIAGNOSIS — R4189 Other symptoms and signs involving cognitive functions and awareness: Secondary | ICD-10-CM

## 2016-06-13 DIAGNOSIS — J069 Acute upper respiratory infection, unspecified: Secondary | ICD-10-CM | POA: Diagnosis not present

## 2016-06-13 DIAGNOSIS — H401133 Primary open-angle glaucoma, bilateral, severe stage: Secondary | ICD-10-CM | POA: Diagnosis not present

## 2016-06-13 MED ORDER — DONEPEZIL HCL 5 MG PO TABS: 5.0000 mg | ORAL_TABLET | Freq: Every day | ORAL | 3 refills | Status: DC

## 2016-06-13 NOTE — Progress Notes (Signed)
Subjective:    Patient ID: Stacy Brennan, female    DOB: 11-18-1945, 70 y.o.   MRN: [id number]  HPI  70 year old female who  has a past medical history of Allergic rhinitis, cause unspecified (01/01/2014); Anxiety state, unspecified (01/01/2014); Cataract; Cognitive impairment; Depression (01/01/2014); Glaucoma; Hypertension; Other and unspecified hyperlipidemia (01/01/2014); Renal insufficiency (01/01/2014); Stroke [facility]) (2002); Unspecified asthma(493.90) (01/01/2014); and Vision decreased. She presents to the office today with her daughter for one week generalized fatigue, sinus pain and pressure, low-grade fever up to 99 and productive cough. Daughter reports that Stacy Mae has been laying in bed for the last 7 days and will only get out to eat and use the restroom. Her symptoms have been waxing and waning and today they seem to be improved. She has not had a fever for the last 3 days. Denies any nausea, vomiting, or diarrhea.  Stacy reports that she's feeling much better today but during conversation she became confused and does not remember laying in bed for the last week nor feeling ill. She does not know why she is at the office today.  She was seen by Dr. Jaynee Eagles with Neurology in 05/2014. Per note:  Decreased visual acuity made it difficult to perform MOCA. MMSE 15/30 in August and 18/30 today.Differential could be dementia vs pseudodementia possibly due to depression. Poor sleep.  Given history of stroke in 2002 will order MRI of the brain to evaluate for possibly continuing cerebrovascular events or other pathology. Will change to open MRI and give patient Diazepam for anxiety.   Review of Systems  Constitutional: Positive for activity change, fatigue and fever. Negative for appetite change, chills and diaphoresis.  HENT: Positive for congestion, postnasal drip, rhinorrhea, sinus pain and sinus pressure. Negative for ear discharge, ear pain, nosebleeds and sore throat.   Respiratory:  Positive for cough. Negative for chest tightness, shortness of breath and wheezing.   Cardiovascular: Negative.   Gastrointestinal: Negative.   Genitourinary: Negative.   Skin: Negative.   Neurological: Negative.   Psychiatric/Behavioral: Positive for confusion. Negative for agitation, behavioral problems, decreased concentration and sleep disturbance.  All other systems reviewed and are negative.  Past Medical History:  Diagnosis Date  . Allergic rhinitis, cause unspecified 01/01/2014  . Anxiety state, unspecified 01/01/2014  . Cataract   . Cognitive impairment   . Depression 01/01/2014  . Glaucoma   . Hypertension   . Other and unspecified hyperlipidemia 01/01/2014  . Renal insufficiency 01/01/2014  . Stroke [facility]) 2002   CVA x 2  . Unspecified asthma(493.90) 01/01/2014  . Vision decreased     Social History   Social History  . Marital status: Single    Spouse name: N/A  . Number of children: 4  . Years of education: 12   Occupational History  . [place] History Main Topics  . Smoking status: Former Smoker    Quit date: 03/20/2002  . Smokeless tobacco: Current User    Types: Snuff  . Alcohol use No  . Drug use: No  . Sexual activity: Not on file   Other Topics Concern  . Not on file   Social History Narrative   Patient is single with 4 children.   Patient is right handed.   Patient has a high school education.   Patient drinks 1 cup a week.       Past Surgical History:  Procedure Laterality Date  . DILATION AND CURETTAGE OF UTERUS    .  [place]      Family History  Problem Relation Age of Onset  . Cancer Other     prostate cancer  . Hypertension Other   . Diabetes Other   . Thyroid disease    . Hyperlipidemia    . Hypertension    . Glaucoma    . Parkinsonism Neg Hx   . Dementia Neg Hx     No Known Allergies  Current Outpatient Prescriptions on File Prior to Visit  Medication Sig Dispense Refill  . alendronate (FOSAMAX) 70 MG  tablet Take 1 tablet (70 mg total) by mouth once a week. Take with a full glass of water on an empty stomach. 4 tablet 11  . ALPRAZolam (XANAX) 0.5 MG tablet Take 1 tablet (0.5 mg total) by mouth 2 (two) times daily. 60 tablet 0  . atorvastatin (LIPITOR) 10 MG tablet Take 1 tablet (10 mg total) by mouth daily. 90 tablet 3  . cholecalciferol (VITAMIN D) 1000 UNITS tablet Take 1,000 Units by mouth every morning.    . clopidogrel (PLAVIX) 75 MG tablet Take 1 tablet (75 mg total) by mouth every morning. 90 tablet 1  . ipratropium (ATROVENT) 0.03 % nasal spray Place 2 sprays into both nostrils every 12 (twelve) hours. 30 mL 12  . labetalol (NORMODYNE) 200 MG tablet TAKE 1 TABLET BY MOUTH TWICE DAILY 60 tablet 2  . Multiple Vitamin (MULTIVITAMIN WITH MINERALS) TABS tablet Take 1 tablet by mouth every morning.    Marland Kitchen spironolactone (ALDACTONE) 25 MG tablet TAKE 1 TABLET (25 MG TOTAL) BY MOUTH EVERY MORNING. 30 tablet 3  . travoprost, benzalkonium, (TRAVATAN) 0.004 % ophthalmic solution 1 drop at bedtime.    . verapamil (CALAN-SR) 240 MG CR tablet TAKE 1 TABLET BY MOUTH AT BEDTIME 30 tablet 9   No current facility-administered medications on file prior to visit.     BP 120/64   Temp 98.6 F (37 C) (Oral)   Ht 5\' 5"  (1.651 m)   Wt 161 lb 9.6 oz (73.3 kg)   BMI 26.89 kg/m       Objective:   Physical Exam  Constitutional: She is oriented to person, place, and time. She appears well-developed and well-nourished. No distress.  HENT:  Head: Normocephalic and atraumatic.  Right Ear: Hearing, tympanic membrane, external ear and ear canal normal. Tympanic membrane is not erythematous and not bulging.  Left Ear: Hearing, tympanic membrane, external ear and ear canal normal. Tympanic membrane is not erythematous and not bulging.  Nose: Mucosal edema present. No rhinorrhea. Right sinus exhibits no maxillary sinus tenderness and no frontal sinus tenderness. Left sinus exhibits no maxillary sinus tenderness  and no frontal sinus tenderness.  Mouth/Throat: Uvula is midline, oropharynx is clear and moist and mucous membranes are normal. Mucous membranes are not pale, not dry and not cyanotic. No oropharyngeal exudate, posterior oropharyngeal edema, posterior oropharyngeal erythema or tonsillar abscesses.  Eyes: Conjunctivae and EOM are normal. Pupils are equal, round, and reactive to light. Right eye exhibits no discharge. Left eye exhibits no discharge. No scleral icterus.  Decreased visional acuity to glaucoma   Neck: Normal range of motion. Neck supple. No tracheal deviation present. No thyromegaly present.  Cardiovascular: Normal rate, regular rhythm, normal heart sounds and intact distal pulses.  Exam reveals no gallop and no friction rub.   No murmur heard. Pulmonary/Chest: Effort normal and breath sounds normal. No respiratory distress. She has no wheezes. She has no rales. She exhibits no tenderness.  Lymphadenopathy:  She has no cervical adenopathy.  Neurological: She is alert and oriented to person, place, and time. She has normal strength. No cranial nerve deficit or sensory deficit.  Skin: Skin is warm and dry. No rash noted. She is not diaphoretic. No erythema. No pallor.  Psychiatric: She has a normal mood and affect. Her behavior is normal. Judgment and thought content normal. Cognition and memory are not impaired. She exhibits abnormal recent memory and abnormal remote memory.  Nursing note and vitals reviewed.     Assessment & Plan:  1. Upper respiratory tract infection, unspecified type - No signs of infection. Advise Flonase and Mucinex. - Follow-up if no improvement in next 2 or 3 days 2. Cognitive impairment - Again decreased visual acuity made this exam difficult for patient she was unable to complete the reading and writing and copying aspect of the exam. She was -2 on orientation to time, was unable to spell the word WORLD backwards, and failed delayed verbal recall -  donepezil (ARICEPT) 5 MG tablet; Take 1 tablet (5 mg total) by mouth at bedtime.  Dispense: 30 tablet; Refill: 3 - Follow-up in 2 months  Dorothyann Peng, NP

## 2016-06-15 DIAGNOSIS — H401133 Primary open-angle glaucoma, bilateral, severe stage: Secondary | ICD-10-CM | POA: Diagnosis not present

## 2016-06-15 DIAGNOSIS — H02839 Dermatochalasis of unspecified eye, unspecified eyelid: Secondary | ICD-10-CM | POA: Diagnosis not present

## 2016-06-15 DIAGNOSIS — Z961 Presence of intraocular lens: Secondary | ICD-10-CM | POA: Diagnosis not present

## 2016-06-15 DIAGNOSIS — I10 Essential (primary) hypertension: Secondary | ICD-10-CM | POA: Diagnosis not present

## 2016-06-28 ENCOUNTER — Telehealth: Payer: Self-pay | Admitting: Adult Health

## 2016-06-28 NOTE — Telephone Encounter (Signed)
[facility] [place] [facility] - Client [place] [facility] Patient Name: Stacy Brennan DOB: [birth year] Initial Comment Caller states her Mother has chills, body aches, she had a fever of 103 last night. She's very weak. Has been going on for a month. Nurse Assessment Nurse: Markus Daft, RN, Sherre Poot Date/Time Eilene Ghazi Time): 06/28/2016 10:42:56 AM Confirm and document reason for call. If symptomatic, describe symptoms. You must click the next button to save text entered. ---Caller states her mother has chills, body aches, she had a fever of 103 by mouth last night. She's very weak. Has been going on off/on for a month. Improved the day to see the doctor then 2 days later started feeling bad again. Has been in bed for days. Worse last night. Vomited once last night. No diarrhea. Cough/ cold for weeks now. Does the patient have any new or worsening symptoms? ---Yes Will a triage be completed? ---Yes Related visit to physician within the last 2 weeks? ---Yes Does the PT have any chronic conditions? (i.e. diabetes, asthma, etc.) ---Yes List chronic conditions. ---HTN Is this a behavioral health or substance abuse call? ---No Guidelines Guideline Title Affirmed Question Affirmed Notes Vomiting [1] Fever > 101 F (38.3 C) AND [2] age > 46 Final Disposition User See Physician within 4 Hours (or PCP triage) Markus Daft, RN, Sherre Poot Comments She would like her to have something for infection like an antibiotic? Refusing appt. at this time. Referrals REFERRED TO PCP OFFICE Disagree/Comply: Comply

## 2016-06-28 NOTE — Telephone Encounter (Signed)
Duplicate message. See TE 06/28/16.

## 2016-06-28 NOTE — Telephone Encounter (Signed)
Patient's daughter notified of [name]'s comments & verbalized understanding.  She states that she would like an appt tomorrow - appt scheduled. I advised that is patient get's worse, and becomes more weak or fever continues, then she needs to be seen in ER.  Patient's daughter verbalized understanding.

## 2016-06-28 NOTE — Telephone Encounter (Signed)
Please advise 

## 2016-06-28 NOTE — Telephone Encounter (Signed)
[facility] [place] [facility] - Client [place] [facility] Patient Name: Stacy Brennan DOB: 06/30/1946 Initial Comment Caller states her Mother has chills, body aches, she had a fever of 103 last night. She's very weak. Has been going on for a month. Nurse Assessment Nurse: Markus Daft, RN, Sherre Poot Date/Time Eilene Ghazi Time): 06/28/2016 10:42:56 AM Confirm and document reason for call. If symptomatic, describe symptoms. You must click the next button to save text entered. ---Caller states her mother has chills, body aches, she had a fever of 103 by mouth last night. She's very weak. Has been going on off/on for a month. Improved the day to see the doctor then 2 days later started feeling bad again. Has been in bed for days. Worse last night. Vomited once last night. No diarrhea. Cough/ cold for weeks now. Does the patient have any new or worsening symptoms? ---Yes Will a triage be completed? ---Yes Related visit to physician within the last 2 weeks? ---Yes Does the PT have any chronic conditions? (i.e. diabetes, asthma, etc.) ---Yes List chronic conditions. ---HTN Is this a behavioral health or substance abuse call? ---No Guidelines Guideline Title Affirmed Question Affirmed Notes Vomiting [1] Fever > 101 F (38.3 C) AND [2] age > 61 Final Disposition User See Physician within 4 Hours (or PCP triage) Markus Daft, RN, Sherre Poot Comments She would like her to have something for infection like an antibiotic? Refusing appt. at this time. Referrals REFERRED TO PCP OFFICE Disagree/Comply: Comply

## 2016-06-28 NOTE — Telephone Encounter (Signed)
I am sorry, but I am unable to see prescribe an antibiotic without seeing her. She may have a viral condition and an antibiotic will not help with that.   If she is extremely weak there is a good chance she is dehydrated and may need to be seen at the ER for fluid replacement

## 2016-06-29 ENCOUNTER — Encounter: Payer: Self-pay | Admitting: Adult Health

## 2016-06-29 ENCOUNTER — Ambulatory Visit (INDEPENDENT_AMBULATORY_CARE_PROVIDER_SITE_OTHER): Payer: Medicare Other | Admitting: Adult Health

## 2016-06-29 VITALS — BP 178/90 | Temp 98.6°F | Ht 65.0 in | Wt 162.8 lb

## 2016-06-29 DIAGNOSIS — R112 Nausea with vomiting, unspecified: Secondary | ICD-10-CM | POA: Diagnosis not present

## 2016-06-29 DIAGNOSIS — J0140 Acute pansinusitis, unspecified: Secondary | ICD-10-CM | POA: Diagnosis not present

## 2016-06-29 MED ORDER — DOXYCYCLINE HYCLATE 100 MG PO CAPS: 100.0000 mg | ORAL_CAPSULE | Freq: Two times a day (BID) | ORAL | 0 refills | Status: DC

## 2016-06-29 MED ORDER — ONDANSETRON HCL 4 MG PO TABS: 4.0000 mg | ORAL_TABLET | Freq: Three times a day (TID) | ORAL | 0 refills | Status: DC | PRN

## 2016-06-29 NOTE — Progress Notes (Signed)
Subjective:    Patient ID: Stacy Brennan, female    DOB: [birth year], 70 y.o.   MRN: [id number]  HPI  70 year old female who  has a past medical history of Allergic rhinitis, cause unspecified (01/01/2014); Anxiety state, unspecified (01/01/2014); Cataract; Cognitive impairment; Depression (01/01/2014); Glaucoma; Hypertension; Other and unspecified hyperlipidemia (01/01/2014); Renal insufficiency (01/01/2014); Stroke [facility]) (2002); Unspecified asthma(493.90) (01/01/2014); and Vision decreased. She presents with her daughter to this visit For sinusitis-like symptoms. Daughter reports that over the last month the patient has been feeling ill she has been experiencing fatigue and weakness, intermittent vomiting, intermittent fevers up to 103F as well as sinus pain and pressure with a running nose with a semi-productive cough. I last saw patient in the office for this approximately 15 days ago at which time her symptoms seem to resolve. Daughter states that the patient really has not been out of bed since I had last seen her. She is staying hydrated and eating but is not eating as much as he wants did due to nausea.    Review of Systems  Constitutional: Positive for activity change, appetite change, chills, fatigue and fever.  HENT: Positive for congestion, postnasal drip, rhinorrhea, sinus pain and sinus pressure. Negative for ear discharge, ear pain, sore throat and trouble swallowing.   Eyes: Negative.   Respiratory: Positive for cough and chest tightness. Negative for shortness of breath and wheezing.   Cardiovascular: Negative.   Gastrointestinal: Positive for nausea and vomiting. Negative for blood in stool, constipation and diarrhea.  Genitourinary: Negative.   Neurological: Positive for weakness. Negative for dizziness, light-headedness and headaches.  Psychiatric/Behavioral: Positive for sleep disturbance.  All other systems reviewed and are negative.  Past Medical History:  Diagnosis Date  .  Allergic rhinitis, cause unspecified 01/01/2014  . Anxiety state, unspecified 01/01/2014  . Cataract   . Cognitive impairment   . Depression 01/01/2014  . Glaucoma   . Hypertension   . Other and unspecified hyperlipidemia 01/01/2014  . Renal insufficiency 01/01/2014  . Stroke 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 Unspecified asthma(493.90) 01/01/2014  . Vision decreased     Social History   Social History  . Marital status: Single    Spouse name: N/A  . Number of children: 4  . Years of education: 12   Occupational History  . [place] History Main Topics  . Smoking status: Former Smoker    Quit date: 03/20/2002  . Smokeless tobacco: Current User    Types: Snuff  . Alcohol use No  . Drug use: No  . Sexual activity: Not on file   Other Topics Concern  . Not on file   Social History Narrative   Patient is single with 4 children.   Patient is right handed.   Patient has a high school education.   Patient drinks 1 cup a week.       Past Surgical History:  Procedure Laterality Date  . DILATION AND CURETTAGE OF UTERUS    . [place]      Family History  Problem Relation Age of Onset  . Cancer Other     prostate cancer  . Hypertension Other   . Diabetes Other   . Thyroid disease    . Hyperlipidemia    . Hypertension    . Glaucoma    . Parkinsonism Neg Hx   . Dementia Neg Hx     No Known Allergies  Current Outpatient Prescriptions on  File Prior to Visit  Medication Sig Dispense Refill  . alendronate (FOSAMAX) 70 MG tablet Take 1 tablet (70 mg total) by mouth once a week. Take with a full glass of water on an empty stomach. 4 tablet 11  . ALPRAZolam (XANAX) 0.5 MG tablet Take 1 tablet (0.5 mg total) by mouth 2 (two) times daily. 60 tablet 0  . atorvastatin (LIPITOR) 10 MG tablet Take 1 tablet (10 mg total) by mouth daily. 90 tablet 3  . cholecalciferol (VITAMIN D) 1000 UNITS tablet Take 1,000 Units by mouth every morning.    . clopidogrel (PLAVIX) 75 MG  tablet Take 1 tablet (75 mg total) by mouth every morning. 90 tablet 1  . donepezil (ARICEPT) 5 MG tablet Take 1 tablet (5 mg total) by mouth at bedtime. 30 tablet 3  . ipratropium (ATROVENT) 0.03 % nasal spray Place 2 sprays into both nostrils every 12 (twelve) hours. 30 mL 12  . labetalol (NORMODYNE) 200 MG tablet TAKE 1 TABLET BY MOUTH TWICE DAILY 60 tablet 2  . Multiple Vitamin (MULTIVITAMIN WITH MINERALS) TABS tablet Take 1 tablet by mouth every morning.    Marland Kitchen spironolactone (ALDACTONE) 25 MG tablet TAKE 1 TABLET (25 MG TOTAL) BY MOUTH EVERY MORNING. 30 tablet 3  . travoprost, benzalkonium, (TRAVATAN) 0.004 % ophthalmic solution 1 drop at bedtime.    . verapamil (CALAN-SR) 240 MG CR tablet TAKE 1 TABLET BY MOUTH AT BEDTIME 30 tablet 9   No current facility-administered medications on file prior to visit.     BP (!) 178/90   Temp 98.6 F (37 C) (Oral)   Ht 5\' 5"  (1.651 m)   Wt 162 lb 12.8 oz (73.8 kg)   BMI 27.09 kg/m       Objective:   Physical Exam  Constitutional: She is oriented to person, place, and time. She appears well-developed and well-nourished. She appears ill. No distress.  HENT:  Head: Normocephalic and atraumatic.  Right Ear: Hearing, tympanic membrane, external ear and ear canal normal.  Left Ear: Hearing, tympanic membrane, external ear and ear canal normal.  Nose: Mucosal edema and rhinorrhea present. Right sinus exhibits maxillary sinus tenderness and frontal sinus tenderness. Left sinus exhibits maxillary sinus tenderness and frontal sinus tenderness.  Mouth/Throat: Uvula is midline and mucous membranes are normal. Oropharyngeal exudate present. No posterior oropharyngeal edema, posterior oropharyngeal erythema or tonsillar abscesses.  Eyes: Conjunctivae and EOM are normal. Pupils are equal, round, and reactive to light. Right eye exhibits no discharge. Left eye exhibits no discharge. No scleral icterus.  Neck: Normal range of motion. Neck supple. No thyromegaly  present.  Cardiovascular: Normal rate, regular rhythm, normal heart sounds and intact distal pulses.  Exam reveals no gallop and no friction rub.   No murmur heard. Pulmonary/Chest: Effort normal and breath sounds normal. No respiratory distress. She has no wheezes. She has no rales. She exhibits no tenderness.  Lymphadenopathy:    She has no cervical adenopathy.  Neurological: She is alert and oriented to person, place, and time.  Skin: Skin is warm and dry. No rash noted. She is not diaphoretic. No erythema. No pallor.  Psychiatric: She has a normal mood and affect. Her behavior is normal. Judgment and thought content normal.  Nursing note and vitals reviewed.     Assessment & Plan:  1. Acute non-recurrent pansinusitis - doxycycline (VIBRAMYCIN) 100 MG capsule; Take 1 capsule (100 mg total) by mouth 2 (two) times daily.  Dispense: 20 capsule; Refill: 0 - Follow-up if  no improvement in the next 2 or 3 days 2. Nausea and vomiting, intractability of vomiting not specified, unspecified vomiting type - ondansetron (ZOFRAN) 4 MG tablet; Take 1 tablet (4 mg total) by mouth every 8 (eight) hours as needed for nausea or vomiting.  Dispense: 20 tablet; Refill: 0 - Stay well-hydrated and rest - Follow up if no improvement in the next 2 or 3 days  Dorothyann Peng, NP

## 2016-06-30 ENCOUNTER — Telehealth: Payer: Self-pay

## 2016-06-30 MED ORDER — AZITHROMYCIN 250 MG PO TABS: ORAL_TABLET | ORAL | 0 refills | Status: AC

## 2016-06-30 NOTE — Telephone Encounter (Signed)
Ok to send in Zpack

## 2016-06-30 NOTE — Telephone Encounter (Signed)
Spoke with pt's daughter, [name], and she states that pt developed rash after first dose of doxycyline. She gave pt Benadryl for itching. She states that rash is still present. Pt has not taken any more doxycyline. She is asking for Zpak instead. Advised her that pt can continue Benadryl prn until rash resolves. If symptoms worsen contact office or go to UC. She voiced understanding.  [facility] [address] - Please advise as to [place]. Thanks!

## 2016-06-30 NOTE — Telephone Encounter (Signed)
Spoke with pt's daughter and advised of approval for Zpak. Rx sent. Nothing further needed.

## 2016-06-30 NOTE — Telephone Encounter (Signed)
Message from [employer]: Stacy Brennan Gender: Female DOB: 01-20-1946 Age: 70 Y 3 M 3 D Return Phone Number: [phone] (Primary), [phone] (Secondary) Address: City/State/Zip:  Client [facility] Night - Client Client Site [facility] - Night Physician Dorothyann Peng - NP Contact Type Call Who Is Calling Patient / Member / Family / Caregiver Call Type Triage / Clinical Caller Name Rosine Beat Relationship To Patient Daughter Return Phone Number [phone] (Primary) Chief Complaint Medication reaction Reason for Call Symptomatic / Request for [place] states her mother is having an allergic reaction to her antibiotic. She is itchingand anxiety..patient has started doxycycline (for a sinus infection) tonight at 730pm and she started saying she was cold and anxious and started itching on her arms and back and head. Rash is pink with small pink bumps all over. Took benedryl 30 minutes ago. No difficulty breathing now.   LMTCB for pt's daughter [name]

## 2016-07-03 DIAGNOSIS — Z1231 Encounter for screening mammogram for malignant neoplasm of breast: Secondary | ICD-10-CM | POA: Diagnosis not present

## 2016-07-10 DIAGNOSIS — I10 Essential (primary) hypertension: Secondary | ICD-10-CM | POA: Diagnosis not present

## 2016-07-10 DIAGNOSIS — J0110 Acute frontal sinusitis, unspecified: Secondary | ICD-10-CM | POA: Diagnosis not present

## 2016-07-10 DIAGNOSIS — F419 Anxiety disorder, unspecified: Secondary | ICD-10-CM | POA: Diagnosis not present

## 2016-07-21 DIAGNOSIS — I739 Peripheral vascular disease, unspecified: Secondary | ICD-10-CM | POA: Diagnosis not present

## 2016-07-21 DIAGNOSIS — L603 Nail dystrophy: Secondary | ICD-10-CM | POA: Diagnosis not present

## 2016-07-21 DIAGNOSIS — L84 Corns and callosities: Secondary | ICD-10-CM | POA: Diagnosis not present

## 2016-08-03 ENCOUNTER — Telehealth: Payer: Self-pay | Admitting: Adult Health

## 2016-08-03 NOTE — Telephone Encounter (Signed)
Called patient to schedule awv. Left msg for patient to call office to schedule appt.  °

## 2016-09-19 ENCOUNTER — Ambulatory Visit (INDEPENDENT_AMBULATORY_CARE_PROVIDER_SITE_OTHER): Payer: Medicare Other | Admitting: Adult Health

## 2016-09-19 ENCOUNTER — Encounter: Payer: Self-pay | Admitting: Adult Health

## 2016-09-19 VITALS — BP 132/70 | Temp 98.1°F | Ht 65.0 in | Wt 158.6 lb

## 2016-09-19 DIAGNOSIS — R4189 Other symptoms and signs involving cognitive functions and awareness: Secondary | ICD-10-CM | POA: Diagnosis not present

## 2016-09-19 LAB — CBC WITH DIFFERENTIAL/PLATELET
Basophils Absolute: 0.1 10*3/uL (ref 0.0–0.1)
Basophils Relative: 0.8 % (ref 0.0–3.0)
EOS PCT: 2.7 % (ref 0.0–5.0)
Eosinophils Absolute: 0.2 10*3/uL (ref 0.0–0.7)
HEMATOCRIT: 37.3 % (ref 36.0–46.0)
Hemoglobin: 12.5 g/dL (ref 12.0–15.0)
LYMPHS PCT: 27.2 % (ref 12.0–46.0)
Lymphs Abs: 1.8 10*3/uL (ref 0.7–4.0)
MCHC: 33.5 g/dL (ref 30.0–36.0)
MCV: 92.6 fl (ref 78.0–100.0)
MONOS PCT: 13.5 % — AB (ref 3.0–12.0)
Monocytes Absolute: 0.9 10*3/uL (ref 0.1–1.0)
NEUTROS ABS: 3.6 10*3/uL (ref 1.4–7.7)
Neutrophils Relative %: 55.8 % (ref 43.0–77.0)
PLATELETS: 213 10*3/uL (ref 150.0–400.0)
RBC: 4.03 Mil/uL (ref 3.87–5.11)
RDW: 13.2 % (ref 11.5–15.5)
WBC: 6.5 10*3/uL (ref 4.0–10.5)

## 2016-09-19 LAB — BASIC METABOLIC PANEL
BUN: 15 mg/dL (ref 6–23)
CHLORIDE: 103 meq/L (ref 96–112)
CO2: 29 mEq/L (ref 19–32)
CREATININE: 1.44 mg/dL — AB (ref 0.40–1.20)
Calcium: 10 mg/dL (ref 8.4–10.5)
GFR: 46.08 mL/min — AB (ref >60.00)
GLUCOSE: 50 mg/dL — AB (ref 70–99)
POTASSIUM: 4.6 meq/L (ref 3.5–5.1)
Sodium: 139 mEq/L (ref 135–145)

## 2016-09-19 LAB — VITAMIN B12: Vitamin B-12: 565 pg/mL (ref 211–911)

## 2016-09-19 MED ORDER — MEMANTINE HCL 5 MG PO TABS: 5.0000 mg | ORAL_TABLET | Freq: Two times a day (BID) | ORAL | 3 refills | Status: DC

## 2016-09-19 NOTE — Progress Notes (Signed)
Subjective:    Patient ID: Stacy Brennan, female    DOB: [birth year], 71 y.o.   MRN: [id number]  HPI  71 year old female who  has a past medical history of Allergic rhinitis, cause unspecified (01/01/2014); Anxiety state, unspecified (01/01/2014); Cataract; Cognitive impairment; Depression (01/01/2014); Glaucoma; Hypertension; Other and unspecified hyperlipidemia (01/01/2014); Renal insufficiency (01/01/2014); Stroke [facility]) (2002); Unspecified asthma(493.90) (01/01/2014); and Vision decreased. She presents with her daughter to this visit who helps supplement. Back in November 2017 Stacy Mae was started on Aricept 5 mg at bedtime for cognitive impairment. Since starting her on this medication, her family reports that they found that she is not sleeping as well and that she has lack of appetite. Proximally 10-14 days ago they stopped the Aricept, and since that time Stacy Mae has been sleeping better and her appetite is starting to come back. Fortunately, her daughter reports further cognitive decline as possible hallucinations. Stacy Mae endorses that she has had some "weird feelings in my mouth". When she is asked to further explain what she means by this she states "it feels like I have rods poking out of my mouth". She also states "feels like my mouth as an assembly line". She cannot further explain what she means by these feelings. Family reports that she recently had 7 teeth extracted but that these "feelings" or happening before she had been seen by the dentist.   Review of Systems  Constitutional: Positive for activity change and appetite change.  Respiratory: Negative.   Cardiovascular: Negative.   Genitourinary: Negative.   Psychiatric/Behavioral: Positive for confusion, hallucinations and sleep disturbance.  All other systems reviewed and are negative.  Past Medical History:  Diagnosis Date  . Allergic rhinitis, cause unspecified 01/01/2014  . Anxiety state, unspecified 01/01/2014  . Cataract    . Cognitive impairment   . Depression 01/01/2014  . Glaucoma   . Hypertension   . Other and unspecified hyperlipidemia 01/01/2014  . Renal insufficiency 01/01/2014  . Stroke [facility]) 2002   CVA x 2  . Unspecified asthma(493.90) 01/01/2014  . Vision decreased     Social History   Social History  . Marital status: Single    Spouse name: N/A  . Number of children: 4  . Years of education: 12   Occupational History  . [place] History Main Topics  . Smoking status: Former Smoker    Quit date: 03/20/2002  . Smokeless tobacco: Current User    Types: Snuff  . Alcohol use No  . Drug use: No  . Sexual activity: Not on file   Other Topics Concern  . Not on file   Social History Narrative   Patient is single with 4 children.   Patient is right handed.   Patient has a high school education.   Patient drinks 1 cup a week.       Past Surgical History:  Procedure Laterality Date  . DILATION AND CURETTAGE OF UTERUS    . [place]      Family History  Problem Relation Age of Onset  . Cancer Other     prostate cancer  . Hypertension Other   . Diabetes Other   . Thyroid disease    . Hyperlipidemia    . Hypertension    . Glaucoma    . Parkinsonism Neg Hx   . Dementia Neg Hx     Allergies  Allergen Reactions  . Doxycycline Rash  . Aricept [Donepezil Hcl] Other (See  Comments)    Insomnia and not eating     Current Outpatient Prescriptions on File Prior to Visit  Medication Sig Dispense Refill  . alendronate (FOSAMAX) 70 MG tablet Take 1 tablet (70 mg total) by mouth once a week. Take with a full glass of water on an empty stomach. 4 tablet 11  . ALPRAZolam (XANAX) 0.5 MG tablet Take 1 tablet (0.5 mg total) by mouth 2 (two) times daily. 60 tablet 0  . atorvastatin (LIPITOR) 10 MG tablet Take 1 tablet (10 mg total) by mouth daily. 90 tablet 3  . cholecalciferol (VITAMIN D) 1000 UNITS tablet Take 1,000 Units by mouth every morning.    . clopidogrel  (PLAVIX) 75 MG tablet Take 1 tablet (75 mg total) by mouth every morning. 90 tablet 1  . ipratropium (ATROVENT) 0.03 % nasal spray Place 2 sprays into both nostrils every 12 (twelve) hours. 30 mL 12  . labetalol (NORMODYNE) 200 MG tablet TAKE 1 TABLET BY MOUTH TWICE DAILY 60 tablet 2  . Multiple Vitamin (MULTIVITAMIN WITH MINERALS) TABS tablet Take 1 tablet by mouth every morning.    . ondansetron (ZOFRAN) 4 MG tablet Take 1 tablet (4 mg total) by mouth every 8 (eight) hours as needed for nausea or vomiting. 20 tablet 0  . spironolactone (ALDACTONE) 25 MG tablet TAKE 1 TABLET (25 MG TOTAL) BY MOUTH EVERY MORNING. 30 tablet 3  . travoprost, benzalkonium, (TRAVATAN) 0.004 % ophthalmic solution 1 drop at bedtime.    . verapamil (CALAN-SR) 240 MG CR tablet TAKE 1 TABLET BY MOUTH AT BEDTIME 30 tablet 9   No current facility-administered medications on file prior to visit.     BP 132/70   Temp 98.1 F (36.7 C) (Oral)   Ht 5\' 5"  (1.651 m)   Wt 158 lb 9.6 oz (71.9 kg)   BMI 26.39 kg/m       Objective:   Physical Exam  Constitutional: She is oriented to person, place, and time. She appears well-developed and well-nourished. No distress.  Cardiovascular: Normal rate, regular rhythm, normal heart sounds and intact distal pulses.  Exam reveals no gallop and no friction rub.   No murmur heard. Pulmonary/Chest: Effort normal and breath sounds normal. No respiratory distress. She has no wheezes. She has no rales. She exhibits no tenderness.  Neurological: She is alert and oriented to person, place, and time. She has normal strength. No cranial nerve deficit or sensory deficit. She displays a negative Romberg sign.  Skin: Skin is warm and dry. No rash noted. She is not diaphoretic. No erythema. No pallor.  Psychiatric: She has a normal mood and affect. Her behavior is normal. Judgment and thought content normal. Cognition and memory are impaired.  Nursing note and vitals reviewed.     Assessment &  Plan:  1. Cognitive impairment - In the office today she is alert and oriented but becomes pleasantly confused at times. I do not see any further decline in her cognitive function. Will get labs, MRI of the brain, and refer to neuropsych for further evaluation of Alzheimer's. Also try starting Namenda 5 mg tablets twice a day - Basic metabolic panel - CBC with Differential/Platelet - memantine (NAMENDA) 5 MG tablet; Take 1 tablet (5 mg total) by mouth 2 (two) times daily.  Dispense: 60 tablet; Refill: 3 - POC Urinalysis Dipstick - B12; Future - Ambulatory referral to Neurology - MR Brain W Wo Contrast; Future - B12 - Follow-up in the office in 2 months  [facility]  Thinh Cuccaro, NP

## 2016-09-27 ENCOUNTER — Other Ambulatory Visit: Payer: Medicare Other

## 2016-10-25 ENCOUNTER — Ambulatory Visit
Admission: RE | Admit: 2016-10-25 | Discharge: 2016-10-25 | Disposition: A | Discharge status: Home / Self Care | Payer: Medicare Other | Source: Ambulatory Visit | Attending: Adult Health | Admitting: Adult Health

## 2016-10-25 DIAGNOSIS — R4189 Other symptoms and signs involving cognitive functions and awareness: Secondary | ICD-10-CM

## 2016-10-25 DIAGNOSIS — R413 Other amnesia: Secondary | ICD-10-CM | POA: Diagnosis not present

## 2016-10-25 MED ORDER — GADOBENATE DIMEGLUMINE 529 MG/ML IV SOLN
7.0000 mL | Freq: Once | INTRAVENOUS | Status: AC | PRN
  Administered 2016-10-25: 7 mL via INTRAVENOUS

## 2016-10-26 DIAGNOSIS — M79609 Pain in unspecified limb: Secondary | ICD-10-CM | POA: Diagnosis not present

## 2016-10-26 DIAGNOSIS — B351 Tinea unguium: Secondary | ICD-10-CM | POA: Diagnosis not present

## 2016-10-31 DIAGNOSIS — I10 Essential (primary) hypertension: Secondary | ICD-10-CM | POA: Diagnosis not present

## 2016-10-31 DIAGNOSIS — F419 Anxiety disorder, unspecified: Secondary | ICD-10-CM | POA: Diagnosis not present

## 2016-11-02 DIAGNOSIS — I10 Essential (primary) hypertension: Secondary | ICD-10-CM | POA: Diagnosis not present

## 2016-11-02 DIAGNOSIS — H16223 Keratoconjunctivitis sicca, not specified as Sjogren's, bilateral: Secondary | ICD-10-CM | POA: Diagnosis not present

## 2016-11-02 DIAGNOSIS — H02839 Dermatochalasis of unspecified eye, unspecified eyelid: Secondary | ICD-10-CM | POA: Diagnosis not present

## 2016-11-02 DIAGNOSIS — H401133 Primary open-angle glaucoma, bilateral, severe stage: Secondary | ICD-10-CM | POA: Diagnosis not present

## 2016-11-09 ENCOUNTER — Encounter: Payer: Medicare Other | Admitting: Psychology

## 2016-11-10 ENCOUNTER — Telehealth: Payer: Self-pay | Admitting: Adult Health

## 2016-11-10 NOTE — Telephone Encounter (Signed)
Rx was refilled 09/19/16 for #60 with 3 refills.  Patient should not be due for this medication until 01/17/17. I verified with pharmacy that this Rx is there at pharmacy. I left message for patient to return phone call.

## 2016-11-10 NOTE — Telephone Encounter (Signed)
PT NEED memantine 5 mg #90 w/refills

## 2016-11-17 NOTE — Telephone Encounter (Signed)
I left message notifying patient via voicemail that this Rx should not need refills, as she should have it at pharmacy until 01/17/17.

## 2016-11-20 ENCOUNTER — Encounter: Payer: Medicare Other | Admitting: Psychology

## 2016-12-11 DIAGNOSIS — H401133 Primary open-angle glaucoma, bilateral, severe stage: Secondary | ICD-10-CM | POA: Diagnosis not present

## 2016-12-21 ENCOUNTER — Encounter: Payer: Medicare Other | Admitting: Psychology

## 2016-12-21 DIAGNOSIS — H16223 Keratoconjunctivitis sicca, not specified as Sjogren's, bilateral: Secondary | ICD-10-CM | POA: Diagnosis not present

## 2016-12-21 DIAGNOSIS — H02839 Dermatochalasis of unspecified eye, unspecified eyelid: Secondary | ICD-10-CM | POA: Diagnosis not present

## 2016-12-21 DIAGNOSIS — I10 Essential (primary) hypertension: Secondary | ICD-10-CM | POA: Diagnosis not present

## 2016-12-21 DIAGNOSIS — H401133 Primary open-angle glaucoma, bilateral, severe stage: Secondary | ICD-10-CM | POA: Diagnosis not present

## 2017-01-11 ENCOUNTER — Encounter: Payer: Medicare Other | Admitting: Psychology

## 2017-01-25 DIAGNOSIS — I739 Peripheral vascular disease, unspecified: Secondary | ICD-10-CM | POA: Diagnosis not present

## 2017-01-25 DIAGNOSIS — L603 Nail dystrophy: Secondary | ICD-10-CM | POA: Diagnosis not present

## 2017-02-07 DIAGNOSIS — H2513 Age-related nuclear cataract, bilateral: Secondary | ICD-10-CM | POA: Diagnosis not present

## 2017-02-22 DIAGNOSIS — H16223 Keratoconjunctivitis sicca, not specified as Sjogren's, bilateral: Secondary | ICD-10-CM | POA: Diagnosis not present

## 2017-02-22 DIAGNOSIS — H02839 Dermatochalasis of unspecified eye, unspecified eyelid: Secondary | ICD-10-CM | POA: Diagnosis not present

## 2017-02-22 DIAGNOSIS — I10 Essential (primary) hypertension: Secondary | ICD-10-CM | POA: Diagnosis not present

## 2017-02-22 DIAGNOSIS — H401133 Primary open-angle glaucoma, bilateral, severe stage: Secondary | ICD-10-CM | POA: Diagnosis not present

## 2017-03-06 DIAGNOSIS — F419 Anxiety disorder, unspecified: Secondary | ICD-10-CM | POA: Diagnosis not present

## 2017-03-06 DIAGNOSIS — I10 Essential (primary) hypertension: Secondary | ICD-10-CM | POA: Diagnosis not present

## 2017-03-15 DIAGNOSIS — H401133 Primary open-angle glaucoma, bilateral, severe stage: Secondary | ICD-10-CM | POA: Diagnosis not present

## 2017-04-06 DIAGNOSIS — H401133 Primary open-angle glaucoma, bilateral, severe stage: Secondary | ICD-10-CM | POA: Diagnosis not present

## 2017-04-19 ENCOUNTER — Encounter: Payer: Self-pay | Admitting: Adult Health

## 2017-04-19 ENCOUNTER — Ambulatory Visit (INDEPENDENT_AMBULATORY_CARE_PROVIDER_SITE_OTHER): Payer: Medicare Other | Admitting: Adult Health

## 2017-04-19 VITALS — BP 180/70 | Temp 97.9°F | Wt 173.0 lb

## 2017-04-19 DIAGNOSIS — J0141 Acute recurrent pansinusitis: Secondary | ICD-10-CM

## 2017-04-19 MED ORDER — AZITHROMYCIN 250 MG PO TABS: ORAL_TABLET | ORAL | 0 refills | Status: DC

## 2017-04-19 NOTE — Progress Notes (Signed)
Subjective:    Patient ID: Stacy Brennan, female    DOB: [birth year], 71 y.o.   MRN: [id number]  Sinusitis  This is a recurrent problem. The current episode started in the past 7 days. The problem has been waxing and waning since onset. The maximum temperature recorded prior to her arrival was 101 - 101.9 F. The fever has been present for 1 to 2 days. Associated symptoms include congestion and sinus pressure. Pertinent negatives include no sore throat. Past treatments include oral decongestants (Robatussin ). The treatment provided mild relief.      Review of Systems  Constitutional: Negative.   HENT: Positive for congestion, rhinorrhea, sinus pain and sinus pressure. Negative for nosebleeds, sore throat and trouble swallowing.   Respiratory: Negative.   Cardiovascular: Negative.    See HPI   Past Medical History:  Diagnosis Date  . Allergic rhinitis, cause unspecified 01/01/2014  . Anxiety state, unspecified 01/01/2014  . Cataract   . Cognitive impairment   . Depression 01/01/2014  . Glaucoma   . Hypertension   . Other and unspecified hyperlipidemia 01/01/2014  . Renal insufficiency 01/01/2014  . Stroke [facility]) 2002   CVA x 2  . Unspecified asthma(493.90) 01/01/2014  . Vision decreased     Social History   Social History  . Marital status: Single    Spouse name: N/A  . Number of children: 4  . Years of education: 12   Occupational History  . [place] History Main Topics  . Smoking status: Former Smoker    Quit date: 03/20/2002  . Smokeless tobacco: Current User    Types: Snuff  . Alcohol use No  . Drug use: No  . Sexual activity: Not on file   Other Topics Concern  . Not on file   Social History Narrative   Patient is single with 4 children.   Patient is right handed.   Patient has a high school education.   Patient drinks 1 cup a week.       Past Surgical History:  Procedure Laterality Date  . DILATION AND CURETTAGE OF UTERUS    .  [place]      Family History  Problem Relation Age of Onset  . Cancer Other        prostate cancer  . Hypertension Other   . Diabetes Other   . Thyroid disease Unknown   . Hyperlipidemia Unknown   . Hypertension Unknown   . Glaucoma Unknown   . Parkinsonism Neg Hx   . Dementia Neg Hx     Allergies  Allergen Reactions  . Warfarin Palpitations  . Doxycycline Rash  . Aricept [Donepezil Hcl] Other (See Comments)    Insomnia and not eating     Current Outpatient Prescriptions on File Prior to Visit  Medication Sig Dispense Refill  . alendronate (FOSAMAX) 70 MG tablet Take 1 tablet (70 mg total) by mouth once a week. Take with a full glass of water on an empty stomach. 4 tablet 11  . ALPRAZolam (XANAX) 0.5 MG tablet Take 1 tablet (0.5 mg total) by mouth 2 (two) times daily. 60 tablet 0  . atorvastatin (LIPITOR) 10 MG tablet Take 1 tablet (10 mg total) by mouth daily. 90 tablet 3  . cholecalciferol (VITAMIN D) 1000 UNITS tablet Take 1,000 Units by mouth every morning.    . labetalol (NORMODYNE) 200 MG tablet TAKE 1 TABLET BY MOUTH TWICE DAILY 60 tablet 2  . Multiple Vitamin (MULTIVITAMIN  WITH MINERALS) TABS tablet Take 1 tablet by mouth every morning.    Marland Kitchen spironolactone (ALDACTONE) 25 MG tablet TAKE 1 TABLET (25 MG TOTAL) BY MOUTH EVERY MORNING. 30 tablet 3  . travoprost, benzalkonium, (TRAVATAN) 0.004 % ophthalmic solution 1 drop at bedtime.    . verapamil (CALAN-SR) 240 MG CR tablet TAKE 1 TABLET BY MOUTH AT BEDTIME 30 tablet 9   No current facility-administered medications on file prior to visit.     BP (!) 180/70 (BP Location: Right Arm)   Temp 97.9 F (36.6 C) (Oral)   Wt 173 lb (78.5 kg)   BMI 28.79 kg/m       Objective:   Physical Exam  Constitutional: She is oriented to person, place, and time. She appears well-developed and well-nourished. No distress.  HENT:  Right Ear: Hearing, tympanic membrane, external ear and ear canal normal. No decreased hearing is  noted.  Left Ear: Hearing, tympanic membrane, external ear and ear canal normal.  Nose: Mucosal edema and rhinorrhea present. Right sinus exhibits maxillary sinus tenderness and frontal sinus tenderness. Left sinus exhibits maxillary sinus tenderness and frontal sinus tenderness.  Mouth/Throat: Uvula is midline and oropharynx is clear and moist.  Cardiovascular: Normal rate, regular rhythm, normal heart sounds and intact distal pulses.  Exam reveals no gallop and no friction rub.   No murmur heard. Pulmonary/Chest: Effort normal and breath sounds normal. No respiratory distress. She has no wheezes. She has no rales. She exhibits no tenderness.  Neurological: She is alert and oriented to person, place, and time.  Skin: Skin is warm and dry. No rash noted. She is not diaphoretic. No erythema. No pallor.  Psychiatric: She has a normal mood and affect. Her behavior is normal. Judgment and thought content normal.  Nursing note and vitals reviewed.      Assessment & Plan:  1. Acute recurrent pansinusitis - Will treat due to symptoms.  - azithromycin (ZITHROMAX Z-PAK) 250 MG tablet; Take 2 tablets on Day 1.  Then take 1 tablet daily.  Dispense: 6 tablet; Refill: 0 - Stay hydrated and rest  - Follow up if no improvement in 2-3 days   Dorothyann Peng, NP

## 2017-04-27 DIAGNOSIS — L84 Corns and callosities: Secondary | ICD-10-CM | POA: Diagnosis not present

## 2017-04-27 DIAGNOSIS — L603 Nail dystrophy: Secondary | ICD-10-CM | POA: Diagnosis not present

## 2017-04-27 DIAGNOSIS — I739 Peripheral vascular disease, unspecified: Secondary | ICD-10-CM | POA: Diagnosis not present

## 2017-06-01 ENCOUNTER — Ambulatory Visit (INDEPENDENT_AMBULATORY_CARE_PROVIDER_SITE_OTHER): Payer: Medicare Other | Admitting: Adult Health

## 2017-06-01 ENCOUNTER — Encounter: Payer: Self-pay | Admitting: Adult Health

## 2017-06-01 VITALS — BP 146/88 | HR 63 | Temp 97.5°F | Wt 176.1 lb

## 2017-06-01 DIAGNOSIS — Z23 Encounter for immunization: Secondary | ICD-10-CM | POA: Diagnosis not present

## 2017-06-01 DIAGNOSIS — Z760 Encounter for issue of repeat prescription: Secondary | ICD-10-CM

## 2017-06-01 DIAGNOSIS — J302 Other seasonal allergic rhinitis: Secondary | ICD-10-CM

## 2017-06-01 MED ORDER — ATORVASTATIN CALCIUM 10 MG PO TABS: 10.0000 mg | ORAL_TABLET | Freq: Every day | ORAL | 3 refills | Status: DC

## 2017-06-01 NOTE — Progress Notes (Signed)
Subjective:    Patient ID: Stacy Brennan, female    DOB: February 16, 1946, 71 y.o.   MRN: [id number]  HPI  71 year old female who  has a past medical history of Allergic rhinitis, cause unspecified (01/01/2014); Anxiety state, unspecified (01/01/2014); Cataract; Cognitive impairment; Depression (01/01/2014); Glaucoma; Hypertension; Other and unspecified hyperlipidemia (01/01/2014); Renal insufficiency (01/01/2014); Stroke [facility]) (2002); Unspecified asthma(493.90) (01/01/2014); and Vision decreased. She presents to the office today for possible seasonal allergy symptoms. She reports that over the last 2 weeks she has been experiencing itchy, watery eyes, sinus pain and pressure, and rhinorrhea. She denies any fevers or feeling acuty bad.   Her daughter has been giving her Robitussin and Benadryl which she reports " has not been doing much to help."   She would also like to have her flu shot   Review of Systems See HPI   Past Medical History:  Diagnosis Date  . Allergic rhinitis, cause unspecified 01/01/2014  . Anxiety state, unspecified 01/01/2014  . Cataract   . Cognitive impairment   . Depression 01/01/2014  . Glaucoma   . Hypertension   . Other and unspecified hyperlipidemia 01/01/2014  . Renal insufficiency 01/01/2014  . Stroke [facility]) 2002   CVA x 2  . Unspecified asthma(493.90) 01/01/2014  . Vision decreased     Social History   Social History  . Marital status: Single    Spouse name: N/A  . Number of children: 4  . Years of education: 12   Occupational History  . [place] History Main Topics  . Smoking status: Former Smoker    Quit date: 03/20/2002  . Smokeless tobacco: Current User    Types: Snuff  . Alcohol use No  . Drug use: No  . Sexual activity: Not on file   Other Topics Concern  . Not on file   Social History Narrative   Patient is single with 4 children.   Patient is right handed.   Patient has a high school education.   Patient drinks 1 cup a week.      Past Surgical History:  Procedure Laterality Date  . DILATION AND CURETTAGE OF UTERUS    . [place]      Family History  Problem Relation Age of Onset  . Cancer Other        prostate cancer  . Hypertension Other   . Diabetes Other   . Thyroid disease Unknown   . Hyperlipidemia Unknown   . Hypertension Unknown   . Glaucoma Unknown   . Parkinsonism Neg Hx   . Dementia Neg Hx     Allergies  Allergen Reactions  . Warfarin Palpitations  . Doxycycline Rash  . Aricept [Donepezil Hcl] Other (See Comments)    Insomnia and not eating     Current Outpatient Prescriptions on File Prior to Visit  Medication Sig Dispense Refill  . alendronate (FOSAMAX) 70 MG tablet Take 1 tablet (70 mg total) by mouth once a week. Take with a full glass of water on an empty stomach. 4 tablet 11  . ALPRAZolam (XANAX) 0.5 MG tablet Take 1 tablet (0.5 mg total) by mouth 2 (two) times daily. 60 tablet 0  . cholecalciferol (VITAMIN D) 1000 UNITS tablet Take 1,000 Units by mouth every morning.    . labetalol (NORMODYNE) 200 MG tablet TAKE 1 TABLET BY MOUTH TWICE DAILY 60 tablet 2  . Multiple Vitamin (MULTIVITAMIN WITH MINERALS) TABS tablet Take 1 tablet by mouth every morning.    Marland Kitchen  spironolactone (ALDACTONE) 25 MG tablet TAKE 1 TABLET (25 MG TOTAL) BY MOUTH EVERY MORNING. 30 tablet 3  . travoprost, benzalkonium, (TRAVATAN) 0.004 % ophthalmic solution 1 drop at bedtime.    . verapamil (CALAN-SR) 240 MG CR tablet TAKE 1 TABLET BY MOUTH AT BEDTIME 30 tablet 9   No current facility-administered medications on file prior to visit.     BP (!) 146/88 (BP Location: Right Arm, Patient Position: Sitting, Cuff Size: Normal)   Pulse 63   Temp (!) 97.5 F (36.4 C) (Oral)   Wt 176 lb 1.6 oz (79.9 kg)   SpO2 98%   BMI 29.30 kg/m       Objective:   Physical Exam  Constitutional: She appears well-developed and well-nourished. No distress.  HENT:  Right Ear: Hearing, external ear and ear canal normal.  Tympanic membrane is bulging. Tympanic membrane is not erythematous.  Left Ear: Hearing, external ear and ear canal normal. Tympanic membrane is bulging. Tympanic membrane is not erythematous.  Nose: Mucosal edema and rhinorrhea present. Right sinus exhibits frontal sinus tenderness. Right sinus exhibits no maxillary sinus tenderness. Left sinus exhibits frontal sinus tenderness. Left sinus exhibits no maxillary sinus tenderness.  Mouth/Throat: Uvula is midline and mucous membranes are normal.  Eyes: Right eye exhibits discharge (watery ). Left eye exhibits discharge (watery ).  Cardiovascular: Normal rate, regular rhythm, normal heart sounds and intact distal pulses.  Exam reveals no gallop and no friction rub.   No murmur heard. Pulmonary/Chest: Effort normal and breath sounds normal. No respiratory distress. She has no wheezes. She has no rales. She exhibits no tenderness.  Skin: She is not diaphoretic.  Nursing note and vitals reviewed.     Assessment & Plan:   1. Seasonal allergies - Appears as seasonal allergies. She does not look ill and is not having a fever. I am going to have her try claritin, advised that it may take up to 2 weeks to work completely. Follow up if symptoms worsen, fever develops, or Claritin is not working   2. Medication refill  - atorvastatin (LIPITOR) 10 MG tablet; Take 1 tablet (10 mg total) by mouth daily.  Dispense: 90 tablet; Refill: 3  3. Needs flu shot  - Flu vaccine HIGH DOSE PF (Fluzone High dose)   Dorothyann Peng, NP

## 2017-07-05 ENCOUNTER — Other Ambulatory Visit: Payer: Self-pay

## 2017-07-12 DIAGNOSIS — Z1231 Encounter for screening mammogram for malignant neoplasm of breast: Secondary | ICD-10-CM | POA: Diagnosis not present

## 2017-07-17 DIAGNOSIS — F419 Anxiety disorder, unspecified: Secondary | ICD-10-CM | POA: Diagnosis not present

## 2017-07-17 DIAGNOSIS — I10 Essential (primary) hypertension: Secondary | ICD-10-CM | POA: Diagnosis not present

## 2017-08-17 DIAGNOSIS — L603 Nail dystrophy: Secondary | ICD-10-CM | POA: Diagnosis not present

## 2017-08-17 DIAGNOSIS — I739 Peripheral vascular disease, unspecified: Secondary | ICD-10-CM | POA: Diagnosis not present

## 2017-09-03 DIAGNOSIS — H401133 Primary open-angle glaucoma, bilateral, severe stage: Secondary | ICD-10-CM | POA: Diagnosis not present

## 2017-11-13 DIAGNOSIS — F419 Anxiety disorder, unspecified: Secondary | ICD-10-CM | POA: Diagnosis not present

## 2017-11-13 DIAGNOSIS — I10 Essential (primary) hypertension: Secondary | ICD-10-CM | POA: Diagnosis not present

## 2017-11-13 DIAGNOSIS — R21 Rash and other nonspecific skin eruption: Secondary | ICD-10-CM | POA: Diagnosis not present

## 2017-11-30 DIAGNOSIS — D485 Neoplasm of uncertain behavior of skin: Secondary | ICD-10-CM | POA: Diagnosis not present

## 2017-11-30 DIAGNOSIS — I739 Peripheral vascular disease, unspecified: Secondary | ICD-10-CM | POA: Diagnosis not present

## 2017-11-30 DIAGNOSIS — L820 Inflamed seborrheic keratosis: Secondary | ICD-10-CM | POA: Diagnosis not present

## 2017-11-30 DIAGNOSIS — L303 Infective dermatitis: Secondary | ICD-10-CM | POA: Diagnosis not present

## 2017-11-30 DIAGNOSIS — L603 Nail dystrophy: Secondary | ICD-10-CM | POA: Diagnosis not present

## 2017-12-20 DIAGNOSIS — D2371 Other benign neoplasm of skin of right lower limb, including hip: Secondary | ICD-10-CM | POA: Diagnosis not present

## 2018-02-13 DIAGNOSIS — H401133 Primary open-angle glaucoma, bilateral, severe stage: Secondary | ICD-10-CM | POA: Diagnosis not present

## 2018-02-15 DIAGNOSIS — I739 Peripheral vascular disease, unspecified: Secondary | ICD-10-CM | POA: Diagnosis not present

## 2018-02-15 DIAGNOSIS — B351 Tinea unguium: Secondary | ICD-10-CM | POA: Diagnosis not present

## 2018-03-05 DIAGNOSIS — F411 Generalized anxiety disorder: Secondary | ICD-10-CM | POA: Diagnosis not present

## 2018-03-05 DIAGNOSIS — F419 Anxiety disorder, unspecified: Secondary | ICD-10-CM | POA: Diagnosis not present

## 2018-04-08 DIAGNOSIS — Z23 Encounter for immunization: Secondary | ICD-10-CM | POA: Diagnosis not present

## 2018-04-26 DIAGNOSIS — H401133 Primary open-angle glaucoma, bilateral, severe stage: Secondary | ICD-10-CM | POA: Diagnosis not present

## 2018-05-03 DIAGNOSIS — L84 Corns and callosities: Secondary | ICD-10-CM | POA: Diagnosis not present

## 2018-05-03 DIAGNOSIS — I739 Peripheral vascular disease, unspecified: Secondary | ICD-10-CM | POA: Diagnosis not present

## 2018-05-03 DIAGNOSIS — B351 Tinea unguium: Secondary | ICD-10-CM | POA: Diagnosis not present

## 2018-05-21 ENCOUNTER — Encounter: Payer: Medicare Other | Admitting: Adult Health

## 2018-05-21 DIAGNOSIS — Z0289 Encounter for other administrative examinations: Secondary | ICD-10-CM

## 2018-05-21 NOTE — Progress Notes (Deleted)
   Subjective:    Patient ID: Stacy Brennan, female    DOB: September 20, 1945, 72 y.o.   MRN: [id number]  HPI  Patient presents for yearly follow up exam. She is a pleasant 72 year old female who  has a past medical history of Allergic rhinitis, cause unspecified (01/01/2014), Anxiety state, unspecified (01/01/2014), Cataract, Cognitive impairment, Depression (01/01/2014), Glaucoma, Hypertension, Other and unspecified hyperlipidemia (01/01/2014), Renal insufficiency (01/01/2014), Stroke ([place]) (2002), Unspecified asthma(493.90) (01/01/2014), and Vision decreased. Her last CPE was in 2016 an she was last seen about a year ago.   Hypertension - Has taken Verapamil, spironolactone, clobetasol in the past.   Hyperlipidemia - Takes lipitor in the past   Osteoporosis - Has taken Fosamax in the past.   All immunizations and health maintenance protocols were reviewed with the patient and needed orders were placed.  Appropriate screening laboratory values were ordered for the patient including screening of hyperlipidemia, renal function and hepatic function. If indicated by BPH, a PSA was ordered.  Medication reconciliation,  past medical history, social history, problem list and allergies were reviewed in detail with the patient  Goals were established with regard to weight loss, exercise, and  diet in compliance with medications  End of life planning was discussed.   Review of Systems     Objective:   Physical Exam        Assessment & Plan:

## 2018-05-30 ENCOUNTER — Ambulatory Visit (INDEPENDENT_AMBULATORY_CARE_PROVIDER_SITE_OTHER): Payer: Medicare Other | Admitting: Adult Health

## 2018-05-30 ENCOUNTER — Encounter: Payer: Self-pay | Admitting: Adult Health

## 2018-05-30 VITALS — BP 150/76 | Temp 98.2°F | Ht 64.5 in | Wt 171.0 lb

## 2018-05-30 DIAGNOSIS — Z1211 Encounter for screening for malignant neoplasm of colon: Secondary | ICD-10-CM

## 2018-05-30 DIAGNOSIS — E782 Mixed hyperlipidemia: Secondary | ICD-10-CM

## 2018-05-30 DIAGNOSIS — E559 Vitamin D deficiency, unspecified: Secondary | ICD-10-CM

## 2018-05-30 DIAGNOSIS — R4189 Other symptoms and signs involving cognitive functions and awareness: Secondary | ICD-10-CM

## 2018-05-30 DIAGNOSIS — I10 Essential (primary) hypertension: Secondary | ICD-10-CM | POA: Diagnosis not present

## 2018-05-30 DIAGNOSIS — Z0000 Encounter for general adult medical examination without abnormal findings: Secondary | ICD-10-CM

## 2018-05-30 DIAGNOSIS — Z23 Encounter for immunization: Secondary | ICD-10-CM

## 2018-05-30 LAB — LIPID PANEL
CHOLESTEROL: 242 mg/dL — AB (ref 0–200)
HDL: 55 mg/dL (ref >39.00)
LDL Cholesterol: 165 mg/dL — ABNORMAL HIGH (ref 0–99)
NONHDL: 187.12
TRIGLYCERIDES: 110 mg/dL (ref 0.0–149.0)
Total CHOL/HDL Ratio: 4
VLDL: 22 mg/dL (ref 0.0–40.0)

## 2018-05-30 LAB — BASIC METABOLIC PANEL
BUN: 27 mg/dL — ABNORMAL HIGH (ref 6–23)
CO2: 29 meq/L (ref 19–32)
Calcium: 10.3 mg/dL (ref 8.4–10.5)
Chloride: 104 mEq/L (ref 96–112)
Creatinine, Ser: 1.44 mg/dL — ABNORMAL HIGH (ref 0.40–1.20)
GFR: 45.87 mL/min — ABNORMAL LOW (ref >60.00)
Glucose, Bld: 88 mg/dL (ref 70–99)
Potassium: 4.9 mEq/L (ref 3.5–5.1)
SODIUM: 139 meq/L (ref 135–145)

## 2018-05-30 LAB — HEPATIC FUNCTION PANEL
ALBUMIN: 4.9 g/dL (ref 3.5–5.2)
ALT: 25 U/L (ref 0–35)
AST: 18 U/L (ref 0–37)
Alkaline Phosphatase: 92 U/L (ref 39–117)
Bilirubin, Direct: 0.1 mg/dL (ref 0.0–0.3)
TOTAL PROTEIN: 7.6 g/dL (ref 6.0–8.3)
Total Bilirubin: 0.6 mg/dL (ref 0.2–1.2)

## 2018-05-30 LAB — CBC WITH DIFFERENTIAL/PLATELET
Basophils Absolute: 0 10*3/uL (ref 0.0–0.1)
Basophils Relative: 0.4 % (ref 0.0–3.0)
Eosinophils Absolute: 0.2 10*3/uL (ref 0.0–0.7)
Eosinophils Relative: 4.2 % (ref 0.0–5.0)
HEMATOCRIT: 40.2 % (ref 36.0–46.0)
Hemoglobin: 13.4 g/dL (ref 12.0–15.0)
LYMPHS PCT: 26.9 % (ref 12.0–46.0)
Lymphs Abs: 1.5 10*3/uL (ref 0.7–4.0)
MCHC: 33.2 g/dL (ref 30.0–36.0)
MCV: 92.3 fl (ref 78.0–100.0)
MONOS PCT: 9.3 % (ref 3.0–12.0)
Monocytes Absolute: 0.5 10*3/uL (ref 0.1–1.0)
NEUTROS ABS: 3.4 10*3/uL (ref 1.4–7.7)
Neutrophils Relative %: 59.2 % (ref 43.0–77.0)
PLATELETS: 176 10*3/uL (ref 150.0–400.0)
RBC: 4.35 Mil/uL (ref 3.87–5.11)
RDW: 13.3 % (ref 11.5–15.5)
WBC: 5.7 10*3/uL (ref 4.0–10.5)

## 2018-05-30 LAB — TSH: TSH: 1.32 u[IU]/mL (ref 0.35–4.50)

## 2018-05-30 LAB — VITAMIN D 25 HYDROXY (VIT D DEFICIENCY, FRACTURES): VITD: 37.42 ng/mL (ref 30.00–100.00)

## 2018-05-30 NOTE — Progress Notes (Signed)
Subjective:    Patient ID: Stacy Brennan, female    DOB: 1945-10-26, 72 y.o.   MRN: [id number]  HPI Patient presents for yearly preventative medicine examination. She is a pleasant 72 year old female who  has a past medical history of Allergic rhinitis, cause unspecified (01/01/2014), Anxiety state, unspecified (01/01/2014), Cataract, Cognitive impairment, Depression (01/01/2014), Glaucoma, Hypertension, Other and unspecified hyperlipidemia (01/01/2014), Renal insufficiency (01/01/2014), Stroke ([place]) (2002), Unspecified asthma(493.90) (01/01/2014), and Vision decreased.  Daughter is with her at this visit to help provide history  Her last physical exam was in 2016 but has been seen periodically for acute issues.   Per Care Everywhere she is also been receiving primary care at [facility] in [place], [place].  Her daughter reports that she is seen down [place] when she is with her son.  Daughter reports that Dr. Tami Ribas has been filling her chronic care medications.  I reviewed the medication list with patient's daughter and medications were updated in the chart.  Essential Hypertension -currently prescribed clobetasol 200 mg twice daily, spironolactone 25 mg daily, and verapamil 240 mg daily  BP Readings from Last 3 Encounters:  05/30/18 (!) 150/76  06/01/17 (!) 146/88  04/19/17 (!) 180/70   Hyperlipidemia - Takes lipitor 10 mg daily   Anxiety State - Is prescribed Xanax 0.5 mg BID   Osteoporosis - Takes Fosamax 70 mg daily.   Cognitive Impairment - family feels as though there has not been any recent change in memory.She continues to have remote memory issues. She spends a majority of the morning and early afternoon in bed sleeping and watching tv. Her appetite is good. She denies depression.   All immunizations and health maintenance protocols were reviewed with the patient and needed orders were placed. She is due for for pneumovax  Appropriate screening laboratory values were ordered for  the patient including screening of hyperlipidemia, renal function and hepatic function.  Medication reconciliation,  past medical history, social history, problem list and allergies were reviewed in detail with the patient  Goals were established with regard to weight loss, exercise, and  diet in compliance with medications Wt Readings from Last 3 Encounters:  05/30/18 171 lb (77.6 kg)  06/01/17 176 lb 1.6 oz (79.9 kg)  04/19/17 173 lb (78.5 kg)    End of life planning was discussed.  She has never had a colonoscopy but is now willing to have one. She has been seen by her dentist this year.    Review of Systems  Constitutional: Negative.   HENT: Positive for rhinorrhea (chronic ).   Eyes: Negative.   Respiratory: Negative.   Cardiovascular: Negative.   Gastrointestinal: Negative.   Endocrine: Negative.   Genitourinary: Negative.   Musculoskeletal: Negative.   Skin: Negative.   Allergic/Immunologic: Negative.   Neurological: Negative.   Hematological: Negative.   Psychiatric/Behavioral: Positive for confusion and sleep disturbance.      Past Medical History:  Diagnosis Date  . Allergic rhinitis, cause unspecified 01/01/2014  . Anxiety state, unspecified 01/01/2014  . Cataract   . Cognitive impairment   . Depression 01/01/2014  . Glaucoma   . Hypertension   . Other and unspecified hyperlipidemia 01/01/2014  . Renal insufficiency 01/01/2014  . Stroke [facility]) 2002   CVA x 2  . Unspecified asthma(493.90) 01/01/2014  . Vision decreased     Social History   Socioeconomic History  . Marital status: Single    Spouse name: Not on file  . Number of children: 4  .  Years of education: 89  . Highest education level: Not on file  Occupational History  . Occupation: Financial risk analyst  . Financial resource strain: Not on file  . Food insecurity:    Worry: Not on file    Inability: Not on file  . Transportation needs:    Medical: Not on file    Non-medical: Not on  file  Tobacco Use  . Smoking status: Former Smoker    Last attempt to quit: 03/20/2002    Years since quitting: 16.2  . Smokeless tobacco: Current User    Types: Snuff  Substance and Sexual Activity  . Alcohol use: No  . Drug use: No  . Sexual activity: Not on file  Lifestyle  . Physical activity:    Days per week: Not on file    Minutes per session: Not on file  . Stress: Not on file  Relationships  . Social connections:    Talks on phone: Not on file    Gets together: Not on file    Attends religious service: Not on file    Active member of club or organization: Not on file    Attends meetings of clubs or organizations: Not on file    Relationship status: Not on file  . Intimate partner violence:    Fear of current or ex partner: Not on file    Emotionally abused: Not on file    Physically abused: Not on file    Forced sexual activity: Not on file  Other Topics Concern  . Not on file  Social History Narrative   Patient is single with 4 children.   Patient is right handed.   Patient has a high school education.   Patient drinks 1 cup a week.    Past Surgical History:  Procedure Laterality Date  . DILATION AND CURETTAGE OF UTERUS    . [place]      Family History  Problem Relation Age of Onset  . Cancer Other        prostate cancer  . Hypertension Other   . Diabetes Other   . Thyroid disease Unknown   . Hyperlipidemia Unknown   . Hypertension Unknown   . Glaucoma Unknown   . Parkinsonism Neg Hx   . Dementia Neg Hx     Allergies  Allergen Reactions  . Warfarin Palpitations  . Doxycycline Rash  . Aricept [Donepezil Hcl] Other (See Comments)    Insomnia and not eating     Current Outpatient Medications on File Prior to Visit  Medication Sig Dispense Refill  . alendronate (FOSAMAX) 70 MG tablet Take 1 tablet (70 mg total) by mouth once a week. Take with a full glass of water on an empty stomach. 4 tablet 11  . ALPRAZolam (XANAX) 0.5 MG tablet Take 1  tablet (0.5 mg total) by mouth 2 (two) times daily. 60 tablet 0  . atorvastatin (LIPITOR) 10 MG tablet Take 1 tablet (10 mg total) by mouth daily. 90 tablet 3  . brimonidine (ALPHAGAN) 0.2 % ophthalmic solution     . cholecalciferol (VITAMIN D) 1000 UNITS tablet Take 1,000 Units by mouth every morning.    . labetalol (NORMODYNE) 200 MG tablet TAKE 1 TABLET BY MOUTH TWICE DAILY 60 tablet 2  . Multiple Vitamin (MULTIVITAMIN WITH MINERALS) TABS tablet Take 1 tablet by mouth every morning.    Marland Kitchen RHOPRESSA 0.02 % SOLN     . spironolactone (ALDACTONE) 25 MG tablet TAKE 1 TABLET (25 MG  TOTAL) BY MOUTH EVERY MORNING. 30 tablet 3  . travoprost, benzalkonium, (TRAVATAN) 0.004 % ophthalmic solution 1 drop at bedtime.    . verapamil (CALAN-SR) 240 MG CR tablet TAKE 1 TABLET BY MOUTH AT BEDTIME 30 tablet 9   No current facility-administered medications on file prior to visit.     BP (!) 150/76   Temp 98.2 F (36.8 C) (Oral)   Ht 5' 4.5" (1.638 m)   Wt 171 lb (77.6 kg)   BMI 28.90 kg/m       Objective:   Physical Exam  Constitutional: She is oriented to person, place, and time. She appears well-developed and well-nourished. No distress.  HENT:  Head: Normocephalic and atraumatic.  Right Ear: Hearing, tympanic membrane, external ear and ear canal normal.  Left Ear: Hearing, tympanic membrane, external ear and ear canal normal.  Nose: Rhinorrhea (clear) present.  Mouth/Throat: Uvula is midline, oropharynx is clear and moist and mucous membranes are normal. Abnormal dentition. No oropharyngeal exudate.  Eyes: Pupils are equal, round, and reactive to light. Conjunctivae and EOM are normal. Right eye exhibits no discharge. Left eye exhibits no discharge. No scleral icterus.  Neck: Normal range of motion. Neck supple. No JVD present. No tracheal deviation present. No thyromegaly present.  Cardiovascular: Normal rate, regular rhythm, normal heart sounds and intact distal pulses. Exam reveals no gallop  and no friction rub.  No murmur heard. Pulmonary/Chest: Effort normal and breath sounds normal. No stridor. No respiratory distress. She has no wheezes. She has no rales. She exhibits no tenderness.  Abdominal: Soft. Normal appearance and bowel sounds are normal. She exhibits no distension and no mass. There is no tenderness. There is no rebound and no guarding. No hernia.  Musculoskeletal: Normal range of motion. She exhibits no edema, tenderness or deformity.  Lymphadenopathy:    She has no cervical adenopathy.  Neurological: She is alert and oriented to person, place, and time. She displays normal reflexes. No cranial nerve deficit or sensory deficit. She exhibits normal muscle tone. Coordination normal.  Skin: Skin is warm and dry. Capillary refill takes less than 2 seconds. No rash noted. She is not diaphoretic. No erythema. No pallor.  Psychiatric: She has a normal mood and affect. Her speech is normal and behavior is normal. Judgment and thought content normal. Cognition and memory are impaired. She exhibits abnormal remote memory.  Nursing note and vitals reviewed.     Assessment & Plan:   *I advised the daughter that ideally one primary care practitioner will be filling Stacy Mays medications.  It is unknown at this time that will be, appears that she recently had all of her medications filled by Dr. Tami Ribas.  I advised the daughter to double check her medication list against the medications that she has at home and let me know if there is been any changes  1. Essential hypertension - Not at goal  - Basic metabolic panel - CBC with Differential/Platelet - Hepatic function panel - Lipid panel - TSH  2. Mixed hyperlipidemia - Continue with statin  - Basic metabolic panel - CBC with Differential/Platelet - Hepatic function panel - Lipid panel - TSH  3. Vitamin D deficiency - Vitamin D, 25-hydroxy  4. Need for prophylactic vaccination and inoculation against influenza Flu  shot given   5. Cognitive impairment - Appears to be stable   Dorothyann Peng, NP

## 2018-05-30 NOTE — Patient Instructions (Signed)
It was great seeing you today   I will follow up with you regarding your blood work   Someone is going to call about the colonoscopy   Please follow up in one year or sooner if needed

## 2018-06-09 ENCOUNTER — Other Ambulatory Visit: Payer: Self-pay | Admitting: Adult Health

## 2018-06-09 DIAGNOSIS — Z760 Encounter for issue of repeat prescription: Secondary | ICD-10-CM

## 2018-06-11 MED ORDER — ATORVASTATIN CALCIUM 20 MG PO TABS: 20.0000 mg | ORAL_TABLET | Freq: Every day | ORAL | 3 refills | Status: DC

## 2018-06-11 NOTE — Telephone Encounter (Signed)
10 mg request denied.  New rx for 20 mg sent to the pharmacy.  No further action required.

## 2018-06-17 ENCOUNTER — Other Ambulatory Visit: Payer: Self-pay

## 2018-07-02 ENCOUNTER — Telehealth: Payer: Self-pay

## 2018-07-02 NOTE — Telephone Encounter (Signed)
Copied from [facility] [id number]. Topic: General - Inquiry >> Jul 02, 2018 11:21 AM Reyne Dumas L wrote: Reason for CRM:   Pt would like to have a printed copy of all labs done this year printed for her to be picked up. Pt can be reached at [phone]

## 2018-07-04 ENCOUNTER — Encounter: Payer: Self-pay | Admitting: Adult Health

## 2018-07-12 DIAGNOSIS — H401133 Primary open-angle glaucoma, bilateral, severe stage: Secondary | ICD-10-CM | POA: Diagnosis not present

## 2018-07-15 DIAGNOSIS — Z1231 Encounter for screening mammogram for malignant neoplasm of breast: Secondary | ICD-10-CM | POA: Diagnosis not present

## 2018-07-18 DIAGNOSIS — I10 Essential (primary) hypertension: Secondary | ICD-10-CM | POA: Diagnosis not present

## 2018-07-18 DIAGNOSIS — F411 Generalized anxiety disorder: Secondary | ICD-10-CM | POA: Diagnosis not present

## 2018-07-18 DIAGNOSIS — F419 Anxiety disorder, unspecified: Secondary | ICD-10-CM | POA: Diagnosis not present

## 2018-07-30 DIAGNOSIS — I739 Peripheral vascular disease, unspecified: Secondary | ICD-10-CM | POA: Diagnosis not present

## 2018-07-30 DIAGNOSIS — B351 Tinea unguium: Secondary | ICD-10-CM | POA: Diagnosis not present

## 2018-07-30 DIAGNOSIS — L603 Nail dystrophy: Secondary | ICD-10-CM | POA: Diagnosis not present

## 2018-08-29 ENCOUNTER — Telehealth: Payer: Self-pay

## 2018-08-29 ENCOUNTER — Other Ambulatory Visit: Payer: Self-pay

## 2018-08-29 ENCOUNTER — Inpatient Hospital Stay (HOSPITAL_COMMUNITY)
Admission: EM | Admit: 2018-08-29 | Discharge: 2018-09-01 | DRG: 271 | Disposition: A | Discharge status: Home / Self Care | Payer: Medicare Other | Attending: Vascular Surgery | Admitting: Vascular Surgery

## 2018-08-29 ENCOUNTER — Encounter (HOSPITAL_COMMUNITY): Payer: Self-pay | Admitting: Emergency Medicine

## 2018-08-29 ENCOUNTER — Emergency Department (HOSPITAL_COMMUNITY): Payer: Medicare Other

## 2018-08-29 DIAGNOSIS — Z8673 Personal history of transient ischemic attack (TIA), and cerebral infarction without residual deficits: Secondary | ICD-10-CM

## 2018-08-29 DIAGNOSIS — I708 Atherosclerosis of other arteries: Secondary | ICD-10-CM | POA: Diagnosis present

## 2018-08-29 DIAGNOSIS — R202 Paresthesia of skin: Secondary | ICD-10-CM | POA: Diagnosis not present

## 2018-08-29 DIAGNOSIS — M810 Age-related osteoporosis without current pathological fracture: Secondary | ICD-10-CM | POA: Diagnosis present

## 2018-08-29 DIAGNOSIS — Z7983 Long term (current) use of bisphosphonates: Secondary | ICD-10-CM

## 2018-08-29 DIAGNOSIS — I10 Essential (primary) hypertension: Secondary | ICD-10-CM | POA: Diagnosis present

## 2018-08-29 DIAGNOSIS — R339 Retention of urine, unspecified: Secondary | ICD-10-CM | POA: Diagnosis not present

## 2018-08-29 DIAGNOSIS — R52 Pain, unspecified: Secondary | ICD-10-CM | POA: Diagnosis not present

## 2018-08-29 DIAGNOSIS — F329 Major depressive disorder, single episode, unspecified: Secondary | ICD-10-CM | POA: Diagnosis present

## 2018-08-29 DIAGNOSIS — F05 Delirium due to known physiological condition: Secondary | ICD-10-CM | POA: Diagnosis not present

## 2018-08-29 DIAGNOSIS — H409 Unspecified glaucoma: Secondary | ICD-10-CM | POA: Diagnosis present

## 2018-08-29 DIAGNOSIS — I82602 Acute embolism and thrombosis of unspecified veins of left upper extremity: Secondary | ICD-10-CM

## 2018-08-29 DIAGNOSIS — Z87891 Personal history of nicotine dependence: Secondary | ICD-10-CM

## 2018-08-29 DIAGNOSIS — I82622 Acute embolism and thrombosis of deep veins of left upper extremity: Secondary | ICD-10-CM | POA: Diagnosis not present

## 2018-08-29 DIAGNOSIS — E785 Hyperlipidemia, unspecified: Secondary | ICD-10-CM | POA: Diagnosis not present

## 2018-08-29 DIAGNOSIS — I742 Embolism and thrombosis of arteries of the upper extremities: Secondary | ICD-10-CM | POA: Diagnosis not present

## 2018-08-29 DIAGNOSIS — I6522 Occlusion and stenosis of left carotid artery: Secondary | ICD-10-CM | POA: Diagnosis not present

## 2018-08-29 DIAGNOSIS — F419 Anxiety disorder, unspecified: Secondary | ICD-10-CM | POA: Diagnosis present

## 2018-08-29 DIAGNOSIS — Z833 Family history of diabetes mellitus: Secondary | ICD-10-CM

## 2018-08-29 DIAGNOSIS — Z8249 Family history of ischemic heart disease and other diseases of the circulatory system: Secondary | ICD-10-CM

## 2018-08-29 DIAGNOSIS — Z79899 Other long term (current) drug therapy: Secondary | ICD-10-CM

## 2018-08-29 LAB — BASIC METABOLIC PANEL
Anion gap: 9 (ref 5–15)
BUN: 28 mg/dL — ABNORMAL HIGH (ref 8–23)
CO2: 21 mmol/L — ABNORMAL LOW (ref 22–32)
Calcium: 9.7 mg/dL (ref 8.9–10.3)
Chloride: 104 mmol/L (ref 98–111)
Creatinine, Ser: 1.47 mg/dL — ABNORMAL HIGH (ref 0.44–1.00)
GFR calc Af Amer: 41 mL/min — ABNORMAL LOW (ref >60)
GFR, EST NON AFRICAN AMERICAN: 35 mL/min — AB (ref >60)
GLUCOSE: 104 mg/dL — AB (ref 70–99)
Potassium: 4.6 mmol/L (ref 3.5–5.1)
Sodium: 134 mmol/L — ABNORMAL LOW (ref 135–145)

## 2018-08-29 LAB — CBC
HEMATOCRIT: 36.6 % (ref 36.0–46.0)
Hemoglobin: 11.8 g/dL — ABNORMAL LOW (ref 12.0–15.0)
MCH: 30 pg (ref 26.0–34.0)
MCHC: 32.2 g/dL (ref 30.0–36.0)
MCV: 93.1 fL (ref 80.0–100.0)
NRBC: 0 % (ref 0.0–0.2)
Platelets: 159 10*3/uL (ref 150–400)
RBC: 3.93 MIL/uL (ref 3.87–5.11)
RDW: 12.6 % (ref 11.5–15.5)
WBC: 7 10*3/uL (ref 4.0–10.5)

## 2018-08-29 MED ORDER — IOPAMIDOL (ISOVUE-370) INJECTION 76%
INTRAVENOUS | Status: AC
  Filled 2018-08-29: qty 100

## 2018-08-29 MED ORDER — IOPAMIDOL (ISOVUE-370) INJECTION 76%
80.0000 mL | Freq: Once | INTRAVENOUS | Status: AC | PRN
  Administered 2018-08-29: 80 mL via INTRAVENOUS

## 2018-08-29 MED ORDER — HEPARIN BOLUS VIA INFUSION
4000.0000 [IU] | Freq: Once | INTRAVENOUS | Status: AC
  Administered 2018-08-29: 4000 [IU] via INTRAVENOUS
  Filled 2018-08-29: qty 4000

## 2018-08-29 MED ORDER — HEPARIN (PORCINE) 25000 UT/250ML-% IV SOLN
1200.0000 [IU]/h | INTRAVENOUS | Status: DC
  Administered 2018-08-29: 1200 [IU]/h via INTRAVENOUS
  Filled 2018-08-29: qty 250

## 2018-08-29 NOTE — Progress Notes (Signed)
ANTICOAGULATION CONSULT NOTE - Initial Consult  Pharmacy Consult for Heparin Indication: L SCA occlusion  Allergies  Allergen Reactions  . Warfarin Palpitations  . Doxycycline Rash  . Aricept [Donepezil Hcl] Other (See Comments)    Insomnia and not eating     Patient Measurements:   Heparin Dosing Weight: 75 kg  Vital Signs: Temp: 98.4 F (36.9 C) (01/30 1928) Temp Source: Oral (01/30 1928) BP: 150/62 (01/30 2300) Pulse Rate: 77 (01/30 2300)  Labs: Recent Labs    08/29/18 2119  HGB 11.8*  HCT 36.6  PLT 159  CREATININE 1.47*    CrCl cannot be calculated (Unknown ideal weight.).   Medical History: Past Medical History:  Diagnosis Date  . Allergic rhinitis, cause unspecified 01/01/2014  . Anxiety state, unspecified 01/01/2014  . Cataract   . Cognitive impairment   . Depression 01/01/2014  . Glaucoma   . Hypertension   . Other and unspecified hyperlipidemia 01/01/2014  . Renal insufficiency 01/01/2014  . Stroke [facility]) 2002   CVA x 2  . Unspecified asthma(493.90) 01/01/2014  . Vision decreased     Medications:  No current facility-administered medications on file prior to encounter.    Current Outpatient Medications on File Prior to Encounter  Medication Sig Dispense Refill  . alendronate (FOSAMAX) 70 MG tablet Take 1 tablet (70 mg total) by mouth once a week. Take with a full glass of water on an empty stomach. 4 tablet 11  . ALPRAZolam (XANAX) 0.5 MG tablet Take 1 tablet (0.5 mg total) by mouth 2 (two) times daily. (Patient taking differently: Take 0.5 mg by mouth 3 (three) times daily as needed for anxiety (2-3 times daily). ) 60 tablet 0  . atorvastatin (LIPITOR) 20 MG tablet Take 1 tablet (20 mg total) by mouth daily. 90 tablet 3  . brimonidine (ALPHAGAN) 0.2 % ophthalmic solution Place 1 drop into both eyes 2 (two) times daily.     . dorzolamide-timolol (COSOPT) 22.3-6.8 MG/ML ophthalmic solution Place 1 drop into both eyes 2 (two) times daily.    Marland Kitchen labetalol  (NORMODYNE) 200 MG tablet TAKE 1 TABLET BY MOUTH TWICE DAILY (Patient taking differently: Take 200 mg by mouth 2 (two) times daily. ) 60 tablet 2  . RHOPRESSA 0.02 % SOLN Place 1 drop into the left eye daily.     Marland Kitchen spironolactone (ALDACTONE) 25 MG tablet TAKE 1 TABLET (25 MG TOTAL) BY MOUTH EVERY MORNING. (Patient taking differently: Take 25 mg by mouth daily. ) 30 tablet 3  . travoprost, benzalkonium, (TRAVATAN) 0.004 % ophthalmic solution Place 1 drop into both eyes at bedtime.     . verapamil (CALAN-SR) 240 MG CR tablet TAKE 1 TABLET BY MOUTH AT BEDTIME (Patient taking differently: Take 240 mg by mouth daily. ) 30 tablet 9  . [DISCONTINUED] zafirlukast (ACCOLATE) 10 MG tablet Take 10 mg by mouth 2 (two) times daily.    Marland Kitchen atorvastatin (LIPITOR) 10 MG tablet Take 1 tablet (10 mg total) by mouth daily. (Patient not taking: Reported on 08/29/2018) 90 tablet 3  . [DISCONTINUED] cholecalciferol (VITAMIN D) 1000 UNITS tablet Take 1,000 Units by mouth every morning.    . [DISCONTINUED] latanoprost (XALATAN) 0.005 % ophthalmic solution Place 1 drop into both eyes daily.    . [DISCONTINUED] Multiple Vitamin (MULTIVITAMIN WITH MINERALS) TABS tablet Take 1 tablet by mouth every morning.       Assessment: 73 y.o. 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   Caryl Pina 08/29/2018,11:21 PM

## 2018-08-29 NOTE — ED Provider Notes (Signed)
[facility] EMERGENCY DEPARTMENT Provider Note   CSN: [id number] Arrival date & time: 08/29/18  4259     History   Chief Complaint Chief Complaint  Patient presents with  . Tingling    L hand    HPI Stacy Brennan is a 73 y.o. female.  HPI Patient is a 73 year old female who had taken a nap today and awoke around 5 PM with sudden pain in her left hand.  She states it felt cool to the touch.  She had difficulty moving her left hand.  Family noticed that the left hand was cool to the touch as well.  Patient is never had anything like this before.  No headache at this time.  No chest pain or upper back pain.  Denies left lower extremity symptoms.  Patient and family reports that the left hand is warmer now than it was when it was home and the patient reports that she is moving her left upper extremity much better than previously.  No prior history of atrial fibrillation.  She does have a prior history of stroke.  Symptoms at their worst were severe in severity.  Now her symptoms are mild.  Her left arm still has some paresthesias in it.   Past Medical History:  Diagnosis Date  . Allergic rhinitis, cause unspecified 01/01/2014  . Anxiety state, unspecified 01/01/2014  . Cataract   . Cognitive impairment   . Depression 01/01/2014  . Glaucoma   . Hypertension   . Other and unspecified hyperlipidemia 01/01/2014  . Renal insufficiency 01/01/2014  . Stroke [facility]) 2002   CVA x 2  . Unspecified asthma(493.90) 01/01/2014  . Vision decreased     Patient Active Problem List   Diagnosis Date Noted  . Osteoporosis 01/21/2015  . Encounter to establish care 12/21/2014  . Sinusitis, chronic 12/21/2014  . Fatigue 09/16/2014  . Cough 09/16/2014  . Benzodiazepine dependence ([place]) 05/02/2014  . Cataract 03/22/2014  . Cognitive impairment 03/22/2014  . Snoring 03/22/2014  . Excessive daytime sleepiness 03/22/2014  . Insomnia 03/22/2014  . Anxiety state 01/01/2014  .  Hyperlipidemia 01/01/2014  . Renal insufficiency 01/01/2014  . Preventative health care 01/01/2014  . Allergic rhinitis, cause unspecified 01/01/2014  . Asthma 01/01/2014  . Depression 01/01/2014  . Stroke ([place])   . Hypertension   . Glaucoma     Past Surgical History:  Procedure Laterality Date  . DILATION AND CURETTAGE OF UTERUS    . TUBALIZATION       OB History   No obstetric history on file.      Home Medications    Prior to Admission medications   Medication Sig Start Date End Date Taking? Authorizing Provider  alendronate (FOSAMAX) 70 MG tablet Take 1 tablet (70 mg total) by mouth once a week. Take with a full glass of water on an empty stomach. 01/21/15  Yes Nafziger, Tommi Rumps, NP  ALPRAZolam (XANAX) 0.5 MG tablet Take 1 tablet (0.5 mg total) by mouth 2 (two) times daily. Patient taking differently: Take 0.5 mg by mouth 3 (three) times daily as needed for anxiety (2-3 times daily).  09/03/15  Yes Nafziger, Tommi Rumps, NP  atorvastatin (LIPITOR) 20 MG tablet Take 1 tablet (20 mg total) by mouth daily. 06/11/18  Yes Nafziger, Tommi Rumps, NP  brimonidine (ALPHAGAN) 0.2 % ophthalmic solution Place 1 drop into both eyes 2 (two) times daily.  04/17/18  Yes [provider]  dorzolamide-timolol (COSOPT) 22.3-6.8 MG/ML ophthalmic solution Place 1 drop into both  eyes 2 (two) times daily.   Yes [provider]  labetalol (NORMODYNE) 200 MG tablet TAKE 1 TABLET BY MOUTH TWICE DAILY Patient taking differently: Take 200 mg by mouth 2 (two) times daily.  02/22/16  Yes Nafziger, Tommi Rumps, NP  RHOPRESSA 0.02 % SOLN Place 1 drop into the left eye daily.  05/15/18  Yes [provider]  spironolactone (ALDACTONE) 25 MG tablet TAKE 1 TABLET (25 MG TOTAL) BY MOUTH EVERY MORNING. Patient taking differently: Take 25 mg by mouth daily.  04/28/15  Yes Nafziger, Tommi Rumps, NP  travoprost, benzalkonium, (TRAVATAN) 0.004 % ophthalmic solution Place 1 drop into both eyes at bedtime.    Yes [provider]  verapamil (CALAN-SR) 240 MG CR tablet TAKE 1 TABLET BY MOUTH AT BEDTIME Patient taking differently: Take 240 mg by mouth daily.  02/21/16  Yes Biagio Borg, MD  atorvastatin (LIPITOR) 10 MG tablet Take 1 tablet (10 mg total) by mouth daily. Patient not taking: Reported on 08/29/2018 06/01/17   Dorothyann Peng, NP    Family History Family History  Problem Relation Age of Onset  . Cancer Other        prostate cancer  . Hypertension Other   . Diabetes Other   . Thyroid disease Unknown   . Hyperlipidemia Unknown   . Hypertension Unknown   . Glaucoma Unknown   . Parkinsonism Neg Hx   . Dementia Neg Hx     Social History Social History   Tobacco Use  . Smoking status: Former Smoker    Last attempt to quit: 03/20/2002    Years since quitting: 16.4  . Smokeless tobacco: Current User    Types: Snuff  Substance Use Topics  . Alcohol use: No  . Drug use: No     Allergies   Warfarin; Doxycycline; and Aricept [donepezil hcl]   Review of Systems Review of Systems  All other systems reviewed and are negative.    Physical Exam Updated Vital Signs BP (!) 167/72   Pulse 73   Temp 98.4 F (36.9 C) (Oral)   Resp 19   SpO2 96%   Physical Exam Vitals signs and nursing note reviewed.  Constitutional:      General: She is not in acute distress.    Appearance: She is well-developed.  HENT:     Head: Normocephalic and atraumatic.  Neck:     Musculoskeletal: Normal range of motion.  Cardiovascular:     Rate and Rhythm: Normal rate and regular rhythm.     Heart sounds: Normal heart sounds.  Pulmonary:     Effort: Pulmonary effort is normal.     Breath sounds: Normal breath sounds.  Abdominal:     General: There is no distension.     Palpations: Abdomen is soft.     Tenderness: There is no abdominal tenderness.  Musculoskeletal:     Comments: No palpable left radial or left brachial pulse.  Left hand is warm.  She has normal strength in her left hand.  No  swelling of the left upper extremity as compared to the right.  Doppler flow present in the left brachial and left radial arteries however extremely weak as compared to the right upper extremity  Skin:    General: Skin is warm and dry.  Neurological:     Mental Status: She is alert and oriented to person, place, and time.  Psychiatric:        Judgment: Judgment normal.      ED Treatments /  Results  Labs (all labs ordered are listed, but only abnormal results are displayed) Labs Reviewed  CBC - Abnormal; Notable for the following components:      Result Value   Hemoglobin 11.8 (*)    All other components within normal limits  BASIC METABOLIC PANEL - Abnormal; Notable for the following components:   Sodium 134 (*)    CO2 21 (*)    Glucose, Bld 104 (*)    BUN 28 (*)    Creatinine, Ser 1.47 (*)    GFR calc non Af Amer 35 (*)    GFR calc Af Amer 41 (*)    All other components within normal limits    EKG EKG Interpretation  Date/Time:  Thursday August 29 2018 19:27:48 EST Ventricular Rate:  75 PR Interval:    QRS Duration: 82 QT Interval:  417 QTC Calculation: 466 R Axis:   49 Text Interpretation:  Sinus rhythm Low voltage, precordial leads Abnormal R-wave progression, early transition No old tracing to compare Confirmed by Jola Schmidt [phone]) on 08/29/2018 11:19:51 PM   Radiology Ct Angio Up Extrem Left W &/or Wo Contast  Result Date: 08/29/2018 CLINICAL DATA:  Left upper extremity weakness with weak radial pulse on the left. Fever yesterday. Numbness and tingling of the left hand. EXAM: CT ANGIOGRAPHY OF THE left upperEXTREMITY TECHNIQUE: Multidetector CT imaging of the left upperwas performed using the standard protocol during bolus administration of intravenous contrast. Multiplanar CT image reconstructions and MIPs were obtained to evaluate the vascular anatomy. CONTRAST:  70mL ISOVUE-370 IOPAMIDOL (ISOVUE-370) INJECTION 76% COMPARISON:  None. FINDINGS: Vascular: There  is segmental occlusion likely representing thrombosis of the left subclavian artery beginning just distal to the origin of the vertebral artery and extending to the axillary artery. There is reconstitution at the axillary artery, likely from chest wall collaterals. Flow is then demonstrated in the axillary artery, brachial artery, and radial artery although diffusely diminished flow is demonstrated in the ulnar artery. There appears to be high-grade stenosis or occlusion of the origin of the ulnar artery. Soft tissues: No soft tissue mass or hematoma is identified. Muscular fat planes appear intact. Visualized chest and abdomen: Normal caliber patent thoracic aorta without evidence of dissection. Great vessel origins are patent. No visualized mediastinal mass or lymphadenopathy. Atelectasis in the left lung base. Left renal cyst. Atherosclerotic calcifications in the aorta. Narrowing of the origin of the left common iliac artery. Review of the MIP images confirms the above findings. 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 There is reconstitution at the axillary artery, likely from chest wall collaterals. There also appears to be focal high-grade stenosis or occlusion of the origin of the ulnar artery. Electronically Signed   By: Lucienne Capers M.D.   On: 08/29/2018 22:59    Procedures .Critical Care Performed by: Jola Schmidt, MD Authorized by: Jola Schmidt, MD   Critical care provider statement:    Critical care time (minutes):  45   Critical care was time spent personally by me on the following activities:  Discussions with consultants, evaluation of patient's response to treatment, examination of patient, ordering and performing treatments and interventions, ordering and review of laboratory studies, ordering and review of radiographic studies, pulse oximetry, re-evaluation of  patient's condition, obtaining history from patient or surrogate and review of old charts   (including critical care time)  Medications Ordered in ED Medications  iopamidol (  ISOVUE-370) 76 % injection (has no administration in time range)  iopamidol (ISOVUE-370) 76 % injection 80 mL (80 mLs Intravenous Contrast Given 08/29/18 2222)     Initial Impression / Assessment and Plan / ED Course  I have reviewed the triage vital signs and the nursing notes.  Pertinent labs & imaging results that were available during my care of the patient were reviewed by me and considered in my medical decision making (see chart for details).     Left hand is now perfused.  She has much better motor function out of it.  Could be that she has ongoing arterial spasm and/or arterial dissection versus acute arterial occlusion.  Given the improving symptoms with resolution of pain patient will undergo CTA left upper extremity for further evaluation.  No chest or back pain.  No left lower extremity symptoms.  Doubt stroke.  Suspect arterial flow issue given dopplerable but weak flow in the left upper extremity as compared to the right  11:21 PM CT angiogram concerning for acute arterial occlusion of the left subclavian artery.  Patient will be initiated on heparin at this time.  She is in sinus rhythm.  She has no prior history of atrial fibrillation.  She is receiving some collateral flow which is why her left upper extremity is improved as to her symptoms at home.  Case discussed with Dr. Donzetta Matters, Vascular Surgery, who will evaluate in the emergency department.  Patient and family updated.  Final Clinical Impressions(s) / ED Diagnoses   Final diagnoses:  None    ED Discharge Orders    None       Jola Schmidt, MD 08/29/18 2322

## 2018-08-29 NOTE — ED Triage Notes (Addendum)
Per [facility], Pt from home with family. Family called 30 because they thought pt was having a stroke. Pt does not have any neuro deficits noted per EMS, pt alert and able to answer questions at this time. Pt reports fever yesterday, denies N/V, abdominal pain, denies sick contact. Pt reports her L hand has tingling and feels numb or frozen. Pt able to move hand and fingers, equal grip strength noted.

## 2018-08-29 NOTE — ED Notes (Signed)
Patient transported to CT 

## 2018-08-29 NOTE — Telephone Encounter (Signed)
Author phoned pt. To offer initial AWV, and pt agreed to make appointment for 10/04/18.

## 2018-08-30 ENCOUNTER — Inpatient Hospital Stay (HOSPITAL_COMMUNITY): Payer: Medicare Other | Admitting: Anesthesiology

## 2018-08-30 ENCOUNTER — Encounter (HOSPITAL_COMMUNITY)
Admission: EM | Disposition: A | Discharge status: Home / Self Care | Payer: Self-pay | Source: Home / Self Care | Attending: Vascular Surgery

## 2018-08-30 ENCOUNTER — Inpatient Hospital Stay (HOSPITAL_COMMUNITY): Payer: Medicare Other

## 2018-08-30 ENCOUNTER — Encounter (HOSPITAL_COMMUNITY): Payer: Self-pay | Admitting: Anesthesiology

## 2018-08-30 DIAGNOSIS — F329 Major depressive disorder, single episode, unspecified: Secondary | ICD-10-CM | POA: Diagnosis present

## 2018-08-30 DIAGNOSIS — E785 Hyperlipidemia, unspecified: Secondary | ICD-10-CM | POA: Diagnosis not present

## 2018-08-30 DIAGNOSIS — R339 Retention of urine, unspecified: Secondary | ICD-10-CM | POA: Diagnosis not present

## 2018-08-30 DIAGNOSIS — I998 Other disorder of circulatory system: Secondary | ICD-10-CM | POA: Diagnosis not present

## 2018-08-30 DIAGNOSIS — F419 Anxiety disorder, unspecified: Secondary | ICD-10-CM | POA: Diagnosis not present

## 2018-08-30 DIAGNOSIS — Z7983 Long term (current) use of bisphosphonates: Secondary | ICD-10-CM | POA: Diagnosis not present

## 2018-08-30 DIAGNOSIS — I742 Embolism and thrombosis of arteries of the upper extremities: Secondary | ICD-10-CM | POA: Diagnosis not present

## 2018-08-30 DIAGNOSIS — Z8249 Family history of ischemic heart disease and other diseases of the circulatory system: Secondary | ICD-10-CM | POA: Diagnosis not present

## 2018-08-30 DIAGNOSIS — I370 Nonrheumatic pulmonary valve stenosis: Secondary | ICD-10-CM | POA: Diagnosis not present

## 2018-08-30 DIAGNOSIS — Z87891 Personal history of nicotine dependence: Secondary | ICD-10-CM | POA: Diagnosis not present

## 2018-08-30 DIAGNOSIS — Z79899 Other long term (current) drug therapy: Secondary | ICD-10-CM | POA: Diagnosis not present

## 2018-08-30 DIAGNOSIS — I748 Embolism and thrombosis of other arteries: Secondary | ICD-10-CM | POA: Diagnosis not present

## 2018-08-30 DIAGNOSIS — Z833 Family history of diabetes mellitus: Secondary | ICD-10-CM | POA: Diagnosis not present

## 2018-08-30 DIAGNOSIS — I10 Essential (primary) hypertension: Secondary | ICD-10-CM | POA: Diagnosis not present

## 2018-08-30 DIAGNOSIS — I708 Atherosclerosis of other arteries: Secondary | ICD-10-CM | POA: Diagnosis present

## 2018-08-30 DIAGNOSIS — Z8673 Personal history of transient ischemic attack (TIA), and cerebral infarction without residual deficits: Secondary | ICD-10-CM | POA: Diagnosis not present

## 2018-08-30 DIAGNOSIS — M810 Age-related osteoporosis without current pathological fracture: Secondary | ICD-10-CM | POA: Diagnosis present

## 2018-08-30 DIAGNOSIS — F05 Delirium due to known physiological condition: Secondary | ICD-10-CM | POA: Diagnosis not present

## 2018-08-30 DIAGNOSIS — H409 Unspecified glaucoma: Secondary | ICD-10-CM | POA: Diagnosis present

## 2018-08-30 HISTORY — PX: THROMBECTOMY BRACHIAL ARTERY: SHX6649

## 2018-08-30 LAB — CBC
HCT: 35.7 % — ABNORMAL LOW (ref 36.0–46.0)
Hemoglobin: 11.8 g/dL — ABNORMAL LOW (ref 12.0–15.0)
MCH: 30.6 pg (ref 26.0–34.0)
MCHC: 33.1 g/dL (ref 30.0–36.0)
MCV: 92.7 fL (ref 80.0–100.0)
PLATELETS: 156 10*3/uL (ref 150–400)
RBC: 3.85 MIL/uL — ABNORMAL LOW (ref 3.87–5.11)
RDW: 12.6 % (ref 11.5–15.5)
WBC: 7.9 10*3/uL (ref 4.0–10.5)
nRBC: 0 % (ref 0.0–0.2)

## 2018-08-30 LAB — BASIC METABOLIC PANEL
Anion gap: 10 (ref 5–15)
BUN: 23 mg/dL (ref 8–23)
CO2: 23 mmol/L (ref 22–32)
CREATININE: 1.32 mg/dL — AB (ref 0.44–1.00)
Calcium: 9.5 mg/dL (ref 8.9–10.3)
Chloride: 104 mmol/L (ref 98–111)
GFR calc Af Amer: 46 mL/min — ABNORMAL LOW (ref >60)
GFR calc non Af Amer: 40 mL/min — ABNORMAL LOW (ref >60)
Glucose, Bld: 102 mg/dL — ABNORMAL HIGH (ref 70–99)
Potassium: 4.5 mmol/L (ref 3.5–5.1)
SODIUM: 137 mmol/L (ref 135–145)

## 2018-08-30 LAB — MRSA PCR SCREENING: MRSA by PCR: NEGATIVE

## 2018-08-30 LAB — ECHOCARDIOGRAM COMPLETE

## 2018-08-30 LAB — HEPARIN LEVEL (UNFRACTIONATED): Heparin Unfractionated: 0.5 IU/mL (ref 0.30–0.70)

## 2018-08-30 SURGERY — THROMBECTOMY, ARTERY, BRACHIAL
Anesthesia: General | Laterality: Left

## 2018-08-30 MED ORDER — HALOPERIDOL LACTATE 5 MG/ML IJ SOLN
5.0000 mg | Freq: Four times a day (QID) | INTRAMUSCULAR | Status: DC | PRN
  Administered 2018-08-31: 5 mg via INTRAVENOUS
  Filled 2018-08-30: qty 1

## 2018-08-30 MED ORDER — HEPARIN SODIUM (PORCINE) 1000 UNIT/ML IJ SOLN
INTRAMUSCULAR | Status: AC
  Filled 2018-08-30: qty 1

## 2018-08-30 MED ORDER — METOPROLOL TARTRATE 5 MG/5ML IV SOLN: 2.0000 mg | INTRAVENOUS | Status: DC | PRN

## 2018-08-30 MED ORDER — HALOPERIDOL LACTATE 5 MG/ML IJ SOLN
INTRAMUSCULAR | Status: AC
  Filled 2018-08-30: qty 1

## 2018-08-30 MED ORDER — MEPERIDINE HCL 50 MG/ML IJ SOLN: 6.2500 mg | INTRAMUSCULAR | Status: DC | PRN

## 2018-08-30 MED ORDER — SODIUM CHLORIDE 0.9 % IV SOLN
INTRAVENOUS | Status: AC
  Filled 2018-08-30: qty 1.2

## 2018-08-30 MED ORDER — PHENOL 1.4 % MT LIQD: 1.0000 | OROMUCOSAL | Status: DC | PRN

## 2018-08-30 MED ORDER — SPIRONOLACTONE 25 MG PO TABS
25.0000 mg | ORAL_TABLET | Freq: Every day | ORAL | Status: DC
  Administered 2018-08-30 – 2018-08-31 (×2): 25 mg via ORAL
  Filled 2018-08-30 (×3): qty 1

## 2018-08-30 MED ORDER — LACTATED RINGERS IV SOLN
INTRAVENOUS | Status: DC | PRN
  Administered 2018-08-30: 01:00:00 via INTRAVENOUS

## 2018-08-30 MED ORDER — ONDANSETRON HCL 4 MG/2ML IJ SOLN: 4.0000 mg | Freq: Four times a day (QID) | INTRAMUSCULAR | Status: DC | PRN

## 2018-08-30 MED ORDER — HYDROMORPHONE HCL 1 MG/ML IJ SOLN: 0.2500 mg | INTRAMUSCULAR | Status: DC | PRN

## 2018-08-30 MED ORDER — ACETAMINOPHEN 650 MG RE SUPP: 325.0000 mg | RECTAL | Status: DC | PRN

## 2018-08-30 MED ORDER — ATORVASTATIN CALCIUM 10 MG PO TABS
20.0000 mg | ORAL_TABLET | Freq: Every day | ORAL | Status: DC
  Administered 2018-08-30 – 2018-08-31 (×2): 20 mg via ORAL
  Filled 2018-08-30 (×3): qty 2

## 2018-08-30 MED ORDER — LABETALOL HCL 200 MG PO TABS
200.0000 mg | ORAL_TABLET | Freq: Two times a day (BID) | ORAL | Status: DC
  Administered 2018-08-30 – 2018-08-31 (×4): 200 mg via ORAL
  Filled 2018-08-30 (×5): qty 1

## 2018-08-30 MED ORDER — 0.9 % SODIUM CHLORIDE (POUR BTL) OPTIME
TOPICAL | Status: DC | PRN
  Administered 2018-08-30: 1000 mL

## 2018-08-30 MED ORDER — PANTOPRAZOLE SODIUM 40 MG PO TBEC
40.0000 mg | DELAYED_RELEASE_TABLET | Freq: Every day | ORAL | Status: DC
  Administered 2018-08-30 – 2018-08-31 (×2): 40 mg via ORAL
  Filled 2018-08-30 (×3): qty 1

## 2018-08-30 MED ORDER — CEFAZOLIN SODIUM 1 G IJ SOLR
INTRAMUSCULAR | Status: AC
  Filled 2018-08-30: qty 20

## 2018-08-30 MED ORDER — SODIUM CHLORIDE 0.9 % IV SOLN
INTRAVENOUS | Status: DC
  Administered 2018-08-30: 03:00:00 via INTRAVENOUS

## 2018-08-30 MED ORDER — PROPOFOL 10 MG/ML IV BOLUS
INTRAVENOUS | Status: AC
  Filled 2018-08-30: qty 20

## 2018-08-30 MED ORDER — CEFAZOLIN SODIUM-DEXTROSE 2-4 GM/100ML-% IV SOLN
2.0000 g | Freq: Three times a day (TID) | INTRAVENOUS | Status: AC
  Administered 2018-08-30 (×2): 2 g via INTRAVENOUS
  Filled 2018-08-30 (×2): qty 100

## 2018-08-30 MED ORDER — ASPIRIN EC 81 MG PO TBEC
81.0000 mg | DELAYED_RELEASE_TABLET | Freq: Every day | ORAL | Status: DC
  Administered 2018-08-30 – 2018-08-31 (×2): 81 mg via ORAL
  Filled 2018-08-30 (×3): qty 1

## 2018-08-30 MED ORDER — ACETAMINOPHEN 325 MG PO TABS: 325.0000 mg | ORAL_TABLET | ORAL | Status: DC | PRN

## 2018-08-30 MED ORDER — HYDRALAZINE HCL 20 MG/ML IJ SOLN: 5.0000 mg | INTRAMUSCULAR | Status: DC | PRN

## 2018-08-30 MED ORDER — FENTANYL CITRATE (PF) 250 MCG/5ML IJ SOLN
INTRAMUSCULAR | Status: DC | PRN
  Administered 2018-08-30 (×2): 100 ug via INTRAVENOUS

## 2018-08-30 MED ORDER — LIDOCAINE HCL (CARDIAC) PF 100 MG/5ML IV SOSY
PREFILLED_SYRINGE | INTRAVENOUS | Status: DC | PRN
  Administered 2018-08-30: 60 mg via INTRATRACHEAL

## 2018-08-30 MED ORDER — HEPARIN SODIUM (PORCINE) 1000 UNIT/ML IJ SOLN
INTRAMUSCULAR | Status: DC | PRN
  Administered 2018-08-30: 5000 [IU] via INTRAVENOUS

## 2018-08-30 MED ORDER — SODIUM CHLORIDE 0.9 % IV SOLN
INTRAVENOUS | Status: DC | PRN
  Administered 2018-08-30: 500 mL

## 2018-08-30 MED ORDER — LABETALOL HCL 5 MG/ML IV SOLN: 10.0000 mg | INTRAVENOUS | Status: DC | PRN

## 2018-08-30 MED ORDER — GUAIFENESIN-DM 100-10 MG/5ML PO SYRP: 15.0000 mL | ORAL_SOLUTION | ORAL | Status: DC | PRN

## 2018-08-30 MED ORDER — ONDANSETRON HCL 4 MG/2ML IJ SOLN: 4.0000 mg | Freq: Once | INTRAMUSCULAR | Status: DC | PRN

## 2018-08-30 MED ORDER — ALPRAZOLAM 0.5 MG PO TABS
0.5000 mg | ORAL_TABLET | Freq: Three times a day (TID) | ORAL | Status: DC | PRN
  Administered 2018-08-30 – 2018-09-01 (×4): 0.5 mg via ORAL
  Filled 2018-08-30 (×5): qty 1

## 2018-08-30 MED ORDER — SODIUM CHLORIDE 0.9 % IV SOLN: 500.0000 mL | Freq: Once | INTRAVENOUS | Status: DC | PRN

## 2018-08-30 MED ORDER — PROPOFOL 10 MG/ML IV BOLUS
INTRAVENOUS | Status: DC | PRN
  Administered 2018-08-30: 100 mg via INTRAVENOUS

## 2018-08-30 MED ORDER — HEPARIN (PORCINE) 25000 UT/250ML-% IV SOLN
850.0000 [IU]/h | INTRAVENOUS | Status: AC
  Administered 2018-08-30: 500 [IU]/h via INTRAVENOUS
  Administered 2018-08-31: 850 [IU]/h via INTRAVENOUS
  Filled 2018-08-30: qty 250

## 2018-08-30 MED ORDER — FENTANYL CITRATE (PF) 250 MCG/5ML IJ SOLN
INTRAMUSCULAR | Status: AC
  Filled 2018-08-30: qty 5

## 2018-08-30 MED ORDER — CEFAZOLIN SODIUM-DEXTROSE 2-3 GM-%(50ML) IV SOLR
INTRAVENOUS | Status: DC | PRN
  Administered 2018-08-30: 2 g via INTRAVENOUS

## 2018-08-30 MED ORDER — VERAPAMIL HCL ER 240 MG PO TBCR
240.0000 mg | EXTENDED_RELEASE_TABLET | Freq: Every day | ORAL | Status: DC
  Administered 2018-08-30 – 2018-08-31 (×2): 240 mg via ORAL
  Filled 2018-08-30 (×3): qty 1

## 2018-08-30 MED ORDER — MORPHINE SULFATE (PF) 2 MG/ML IV SOLN
2.0000 mg | INTRAVENOUS | Status: DC | PRN
  Administered 2018-08-30: 2 mg via INTRAVENOUS
  Filled 2018-08-30: qty 1

## 2018-08-30 MED ORDER — SUCCINYLCHOLINE CHLORIDE 20 MG/ML IJ SOLN
INTRAMUSCULAR | Status: DC | PRN
  Administered 2018-08-30: 100 mg via INTRAVENOUS

## 2018-08-30 MED ORDER — OXYCODONE-ACETAMINOPHEN 5-325 MG PO TABS
1.0000 | ORAL_TABLET | ORAL | Status: DC | PRN
  Administered 2018-08-30 (×2): 2 via ORAL
  Filled 2018-08-30 (×2): qty 2

## 2018-08-30 SURGICAL SUPPLY — 28 items
ARMBAND PINK RESTRICT EXTREMIT (MISCELLANEOUS) ×3 IMPLANT
CANISTER SUCT 3000ML PPV (MISCELLANEOUS) ×3 IMPLANT
CATH EMB 3FR 80CM (CATHETERS) ×3 IMPLANT
CLIP VESOCCLUDE MED 6/CT (CLIP) ×3 IMPLANT
CLIP VESOCCLUDE SM WIDE 6/CT (CLIP) ×3 IMPLANT
COVER PROBE W GEL 5X96 (DRAPES) ×3 IMPLANT
COVER WAND RF STERILE (DRAPES) ×3 IMPLANT
DERMABOND ADVANCED (GAUZE/BANDAGES/DRESSINGS) ×2
DERMABOND ADVANCED .7 DNX12 (GAUZE/BANDAGES/DRESSINGS) ×1 IMPLANT
ELECT REM PT RETURN 9FT ADLT (ELECTROSURGICAL) ×3
ELECTRODE REM PT RTRN 9FT ADLT (ELECTROSURGICAL) ×1 IMPLANT
GLOVE BIO SURGEON STRL SZ7.5 (GLOVE) ×3 IMPLANT
GOWN STRL REUS W/ TWL LRG LVL3 (GOWN DISPOSABLE) ×2 IMPLANT
GOWN STRL REUS W/ TWL XL LVL3 (GOWN DISPOSABLE) ×1 IMPLANT
GOWN STRL REUS W/TWL LRG LVL3 (GOWN DISPOSABLE) ×4
GOWN STRL REUS W/TWL XL LVL3 (GOWN DISPOSABLE) ×2
KIT BASIN OR (CUSTOM PROCEDURE TRAY) ×3 IMPLANT
KIT TURNOVER KIT B (KITS) ×3 IMPLANT
NS IRRIG 1000ML POUR BTL (IV SOLUTION) ×3 IMPLANT
PACK CV ACCESS (CUSTOM PROCEDURE TRAY) ×3 IMPLANT
PAD ARMBOARD 7.5X6 YLW CONV (MISCELLANEOUS) ×6 IMPLANT
SUT MNCRL AB 4-0 PS2 18 (SUTURE) ×3 IMPLANT
SUT PROLENE 6 0 BV (SUTURE) ×3 IMPLANT
SUT VIC AB 3-0 SH 27 (SUTURE) ×2
SUT VIC AB 3-0 SH 27X BRD (SUTURE) ×1 IMPLANT
TOWEL GREEN STERILE (TOWEL DISPOSABLE) ×3 IMPLANT
UNDERPAD 30X30 (UNDERPADS AND DIAPERS) ×3 IMPLANT
WATER STERILE IRR 1000ML POUR (IV SOLUTION) ×3 IMPLANT

## 2018-08-30 NOTE — H&P (Signed)
HP    Reason for Consult:  Occluded left subclavian artery Referring Physician:  Dr. Venora Maples MRN #:  [id number]  History of Present Illness: This is a 73 y.o. female presented for acute left upper extremity hand pain and was feeling cold to touch.  She also noted some numbness.  She had never had issues like this before.  Currently hand is feeling much better she is on heparin drip.  Has not had any history of palpitations.  Does have a history of remote stroke unknown type of stroke with no residual deficit.  Currently taking no antiplatelet or blood thinners.  Was previously on Plavix but self discontinued this.  Currently she does not have any complaints.  Patient history is assisted with grandson as she is a limited historian.  Past Medical History:  Diagnosis Date  . Allergic rhinitis, cause unspecified 01/01/2014  . Anxiety state, unspecified 01/01/2014  . Cataract   . Cognitive impairment   . Depression 01/01/2014  . Glaucoma   . Hypertension   . Other and unspecified hyperlipidemia 01/01/2014  . Renal insufficiency 01/01/2014  . Stroke [facility]) 2002   CVA x 2  . Unspecified asthma(493.90) 01/01/2014  . Vision decreased     Past Surgical History:  Procedure Laterality Date  . DILATION AND CURETTAGE OF UTERUS    . TUBALIZATION      Allergies  Allergen Reactions  . Warfarin Palpitations  . Doxycycline Rash  . Aricept [Donepezil Hcl] Other (See Comments)    Insomnia and not eating     Prior to Admission medications   Medication Sig Start Date End Date Taking? Authorizing Provider  alendronate (FOSAMAX) 70 MG tablet Take 1 tablet (70 mg total) by mouth once a week. Take with a full glass of water on an empty stomach. 01/21/15  Yes Nafziger, Tommi Rumps, NP  ALPRAZolam (XANAX) 0.5 MG tablet Take 1 tablet (0.5 mg total) by mouth 2 (two) times daily. Patient taking differently: Take 0.5 mg by mouth 3 (three) times daily as needed for anxiety (2-3 times daily).  09/03/15  Yes Nafziger, Tommi Rumps, NP    atorvastatin (LIPITOR) 20 MG tablet Take 1 tablet (20 mg total) by mouth daily. 06/11/18  Yes Nafziger, Tommi Rumps, NP  brimonidine (ALPHAGAN) 0.2 % ophthalmic solution Place 1 drop into both eyes 2 (two) times daily.  04/17/18  Yes [provider]  dorzolamide-timolol (COSOPT) 22.3-6.8 MG/ML ophthalmic solution Place 1 drop into both eyes 2 (two) times daily.   Yes [provider]  labetalol (NORMODYNE) 200 MG tablet TAKE 1 TABLET BY MOUTH TWICE DAILY Patient taking differently: Take 200 mg by mouth 2 (two) times daily.  02/22/16  Yes Nafziger, Tommi Rumps, NP  RHOPRESSA 0.02 % SOLN Place 1 drop into the left eye daily.  05/15/18  Yes [provider]  spironolactone (ALDACTONE) 25 MG tablet TAKE 1 TABLET (25 MG TOTAL) BY MOUTH EVERY MORNING. Patient taking differently: Take 25 mg by mouth daily.  04/28/15  Yes Nafziger, Tommi Rumps, NP  travoprost, benzalkonium, (TRAVATAN) 0.004 % ophthalmic solution Place 1 drop into both eyes at bedtime.    Yes [provider]  verapamil (CALAN-SR) 240 MG CR tablet TAKE 1 TABLET BY MOUTH AT BEDTIME Patient taking differently: Take 240 mg by mouth daily.  02/21/16  Yes Biagio Borg, MD  atorvastatin (LIPITOR) 10 MG tablet Take 1 tablet (10 mg total) by mouth daily. Patient not taking: Reported on 08/29/2018 06/01/17   Dorothyann Peng, NP    Social History  Socioeconomic History  . Marital status: Single    Spouse name: Not on file  . Number of children: 4  . Years of education: 72  . Highest education level: Not on file  Occupational History  . Occupation: Financial risk analyst  . Financial resource strain: Not on file  . Food insecurity:    Worry: Not on file    Inability: Not on file  . Transportation needs:    Medical: Not on file    Non-medical: Not on file  Tobacco Use  . Smoking status: Former Smoker    Last attempt to quit: 03/20/2002    Years since quitting: 16.4  . Smokeless tobacco: Current User    Types: Snuff   Substance and Sexual Activity  . Alcohol use: No  . Drug use: No  . Sexual activity: Not on file  Lifestyle  . Physical activity:    Days per week: Not on file    Minutes per session: Not on file  . Stress: Not on file  Relationships  . Social connections:    Talks on phone: Not on file    Gets together: Not on file    Attends religious service: Not on file    Active member of club or organization: Not on file    Attends meetings of clubs or organizations: Not on file    Relationship status: Not on file  . Intimate partner violence:    Fear of current or ex partner: Not on file    Emotionally abused: Not on file    Physically abused: Not on file    Forced sexual activity: Not on file  Other Topics Concern  . Not on file  Social History Narrative   Patient is single with 4 children.   Patient is right handed.   Patient has a high school education.   Patient drinks 1 cup a week.     Family History  Problem Relation Age of Onset  . Cancer Other        prostate cancer  . Hypertension Other   . Diabetes Other   . Thyroid disease Other   . Hyperlipidemia Other   . Hypertension Other   . Glaucoma Other   . Parkinsonism Neg Hx   . Dementia Neg Hx     ROS:  She was previously having numbness and pain in the left upper extremity but this has resolved  Physical Examination  Vitals:   08/29/18 2300 08/29/18 2345  BP: (!) 150/62 (!) 157/68  Pulse: 77 75  Resp: 18 (!) 21  Temp:    SpO2: 94% 98%   There is no height or weight on file to calculate BMI.  General: She is in no acute distress Gait: Not observed HENT: WNL, normocephalic Pulmonary: normal non-labored breathing Cardiac: There is a multiphasic signal at her supraclavicular subclavian artery, brachial and radial signals distally that are monophasic on the left Right palpable brachial and radial pulses Palpable bilateral common femoral pulses and right popliteal pulse but no palpable left popliteal pulse  with no pedal pulses are palpable Abdomen:  soft, NT/ND, no masses Extremities: All extremities are warm without ulceration tissue loss Neurologic: She is awake alert and oriented time person and place somewhat confused on time.  She is moving all extremities without limitation has grip strength bilaterally 5 out of 5  CBC    Component Value Date/Time   WBC 7.0 08/29/2018 2119   RBC 3.93 08/29/2018 2119   HGB  11.8 (L) 08/29/2018 2119   HCT 36.6 08/29/2018 2119   PLT 159 08/29/2018 2119   MCV 93.1 08/29/2018 2119   MCH 30.0 08/29/2018 2119   MCHC 32.2 08/29/2018 2119   RDW 12.6 08/29/2018 2119   LYMPHSABS 1.5 05/30/2018 1415   MONOABS 0.5 05/30/2018 1415   EOSABS 0.2 05/30/2018 1415   BASOSABS 0.0 05/30/2018 1415    BMET    Component Value Date/Time   NA 134 (L) 08/29/2018 2119   K 4.6 08/29/2018 2119   CL 104 08/29/2018 2119   CO2 21 (L) 08/29/2018 2119   GLUCOSE 104 (H) 08/29/2018 2119   BUN 28 (H) 08/29/2018 2119   CREATININE 1.47 (H) 08/29/2018 2119   CALCIUM 9.7 08/29/2018 2119   GFRNONAA 35 (L) 08/29/2018 2119   GFRAA 41 (L) 08/29/2018 2119    COAGS: No results found for: INR, PROTIME   Non-Invasive Vascular Imaging:   IMPRESSION: 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 There is reconstitution at the axillary artery, likely from chest wall collaterals. There also appears to be focal high-grade stenosis or occlusion of the origin of the ulnar artery.    ASSESSMENT/PLAN: This is a 73 y.o. female presented with left upper extremity acute pain found to have occluded subclavian artery on the left without any evidence of calcification.  This is most likely consistent with acute occlusion given palpable pulses elsewhere and monophasic signals distally in her left upper extremity.  I discussed proceeding with left upper extremity thromboembolectomy which would  possibly require exploration of the subclavian artery itself with either patch angioplasty or bypass.  Her grandson was present her daughter was on the telephone to assist with history and discussion although patient does appear to understand the current situation.  We will proceed urgently to the operating room tonight.  Heparin drip has been started and work-up for embolic disease will be undertaken following surgery.  Mattea Seger C. Donzetta Matters, MD [facility] Office: [phone] Pager: [phone]

## 2018-08-30 NOTE — Progress Notes (Signed)
[facility] for Heparin Indication: arterial occlusion, now post-op  Allergies  Allergen Reactions  . Warfarin Palpitations  . Doxycycline Rash  . Aricept [Donepezil Hcl] Other (See Comments)    Insomnia and not eating     Patient Measurements:   Heparin Dosing Weight: 75 kg  Vital Signs: Temp: 97.6 F (36.4 C) (01/31 0748) Temp Source: Oral (01/31 0748) BP: 182/82 (01/31 0748) Pulse Rate: 75 (01/31 0748)  Labs: Recent Labs    08/29/18 2119 08/30/18 0527  HGB 11.8* 11.8*  HCT 36.6 35.7*  PLT 159 156  CREATININE 1.47* 1.32*    CrCl cannot be calculated (Unknown ideal weight.).   Medical History: Past Medical History:  Diagnosis Date  . Allergic rhinitis, cause unspecified 01/01/2014  . Anxiety state, unspecified 01/01/2014  . Cataract   . Cognitive impairment   . Depression 01/01/2014  . Glaucoma   . Hypertension   . Other and unspecified hyperlipidemia 01/01/2014  . Renal insufficiency 01/01/2014  . Stroke [facility]) 2002   CVA x 2  . Unspecified asthma(493.90) 01/01/2014  . Vision decreased     Medications:  No current facility-administered medications on file prior to encounter.    Current Outpatient Medications on File Prior to Encounter  Medication Sig Dispense Refill  . alendronate (FOSAMAX) 70 MG tablet Take 1 tablet (70 mg total) by mouth once a week. Take with a full glass of water on an empty stomach. 4 tablet 11  . ALPRAZolam (XANAX) 0.5 MG tablet Take 1 tablet (0.5 mg total) by mouth 2 (two) times daily. (Patient taking differently: Take 0.5 mg by mouth 3 (three) times daily as needed for anxiety (2-3 times daily). ) 60 tablet 0  . atorvastatin (LIPITOR) 20 MG tablet Take 1 tablet (20 mg total) by mouth daily. 90 tablet 3  . brimonidine (ALPHAGAN) 0.2 % ophthalmic solution Place 1 drop into both eyes 2 (two) times daily.     . dorzolamide-timolol (COSOPT) 22.3-6.8 MG/ML ophthalmic solution Place 1 drop into both eyes 2  (two) times daily.    Marland Kitchen labetalol (NORMODYNE) 200 MG tablet TAKE 1 TABLET BY MOUTH TWICE DAILY (Patient taking differently: Take 200 mg by mouth 2 (two) times daily. ) 60 tablet 2  . RHOPRESSA 0.02 % SOLN Place 1 drop into the left eye daily.     Marland Kitchen spironolactone (ALDACTONE) 25 MG tablet TAKE 1 TABLET (25 MG TOTAL) BY MOUTH EVERY MORNING. (Patient taking differently: Take 25 mg by mouth daily. ) 30 tablet 3  . travoprost, benzalkonium, (TRAVATAN) 0.004 % ophthalmic solution Place 1 drop into both eyes at bedtime.     . verapamil (CALAN-SR) 240 MG CR tablet TAKE 1 TABLET BY MOUTH AT BEDTIME (Patient taking differently: Take 240 mg by mouth daily. ) 30 tablet 9  . atorvastatin (LIPITOR) 10 MG tablet Take 1 tablet (10 mg total) by mouth daily. 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

## 2018-08-30 NOTE — Transfer of Care (Signed)
Immediate Anesthesia Transfer of Care Note  Patient: Stacy Brennan  Procedure(s) Performed: LEFT SUBCLAVIAN ARTERY THROMBECTOMY (Left )  Patient Location: PACU  Anesthesia Type:General  Level of Consciousness: drowsy  Airway & Oxygen Therapy: Patient Spontanous Breathing and Patient connected to face mask oxygen  Post-op Assessment: Report given to RN and Post -op Vital signs reviewed and stable  Post vital signs: Reviewed and stable  Last Vitals:  Vitals Value Taken Time  BP 156/70 08/30/2018  2:25 AM  Temp    Pulse 77 08/30/2018  2:25 AM  Resp 12 08/30/2018  2:25 AM  SpO2 98 % 08/30/2018  2:25 AM  Vitals shown include unvalidated device data.  Last Pain:  Vitals:   08/29/18 2253  TempSrc:   PainSc: 0-No pain         Complications: No apparent anesthesia complications

## 2018-08-30 NOTE — ED Notes (Signed)
Vascular at bedside

## 2018-08-30 NOTE — Anesthesia Preprocedure Evaluation (Signed)
Anesthesia Evaluation  Patient identified by MRN, date of birth, ID band Patient awake    Reviewed: Allergy & Precautions, NPO status , Patient's Chart, lab work & pertinent test results  Airway Mallampati: II  TM Distance: >3 FB Neck ROM: Full    Dental   Pulmonary former smoker,    Pulmonary exam normal        Cardiovascular hypertension, Pt. on medications Normal cardiovascular exam     Neuro/Psych Anxiety Depression CVA    GI/Hepatic   Endo/Other    Renal/GU Renal InsufficiencyRenal disease     Musculoskeletal   Abdominal   Peds  Hematology   Anesthesia Other Findings   Reproductive/Obstetrics                             Anesthesia Physical Anesthesia Plan  ASA: III and emergent  Anesthesia Plan: General   Post-op Pain Management:    Induction: Intravenous, Rapid sequence and Cricoid pressure planned  PONV Risk Score and Plan: 3 and Ondansetron, Midazolam and Treatment may vary due to age or medical condition  Airway Management Planned: Oral ETT  Additional Equipment:   Intra-op Plan:   Post-operative Plan: Extubation in OR  Informed Consent: I have reviewed the patients History and Physical, chart, labs and discussed the procedure including the risks, benefits and alternatives for the proposed anesthesia with the patient or authorized representative who has indicated his/her understanding and acceptance.       Plan Discussed with: CRNA and Surgeon  Anesthesia Plan Comments:         Anesthesia Quick Evaluation

## 2018-08-30 NOTE — Progress Notes (Signed)
Orthopedic Tech Progress Note Patient Details:  Stacy Brennan [birth year] [id number] Tech said patient does have on the ace wrap RN was in meeting Patient ID: Stacy Brennan, female   DOB: 28-Oct-1945, 73 y.o.   MRN: [id number]   Janit Pagan 08/30/2018, 11:00 AM

## 2018-08-30 NOTE — Progress Notes (Signed)
  Progress Note    08/30/2018 10:16 AM Day of Surgery  Subjective: Swelling overnight and Ace wrap applied Left hand doing well  Vitals:   08/30/18 0748 08/30/18 0800  BP: (!) 182/82 (!) 178/78  Pulse: 75 70  Resp: (!) 24 18  Temp: 97.6 F (36.4 C)   SpO2: 97% 97%    Physical Exam: Awake alert and oriented moving all extremities Nonlabored respirations Palpable left radial pulse Minimal hematoma left above antecubitum incision  CBC    Component Value Date/Time   WBC 7.9 08/30/2018 0527   RBC 3.85 (L) 08/30/2018 0527   HGB 11.8 (L) 08/30/2018 0527   HCT 35.7 (L) 08/30/2018 0527   PLT 156 08/30/2018 0527   MCV 92.7 08/30/2018 0527   MCH 30.6 08/30/2018 0527   MCHC 33.1 08/30/2018 0527   RDW 12.6 08/30/2018 0527   LYMPHSABS 1.5 05/30/2018 1415   MONOABS 0.5 05/30/2018 1415   EOSABS 0.2 05/30/2018 1415   BASOSABS 0.0 05/30/2018 1415    BMET    Component Value Date/Time   NA 137 08/30/2018 0527   K 4.5 08/30/2018 0527   CL 104 08/30/2018 0527   CO2 23 08/30/2018 0527   GLUCOSE 102 (H) 08/30/2018 0527   BUN 23 08/30/2018 0527   CREATININE 1.32 (H) 08/30/2018 0527   CALCIUM 9.5 08/30/2018 0527   GFRNONAA 40 (L) 08/30/2018 0527   GFRAA 46 (L) 08/30/2018 0527    INR No results found for: INR   Intake/Output Summary (Last 24 hours) at 08/30/2018 1016 Last data filed at 08/30/2018 0800 Gross per 24 hour  Intake 1093.92 ml  Output 850 ml  Net 243.92 ml     Assessment/plan:  73 y.o. 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  Will transition to full dose heparin.  Ace wrap to the upper arm.  Foley catheter out tomorrow morning as it was placed overnight for urinary retention.  She will need aspirin and p.o. anticoagulation for home.  We will check an echo today for source.    Rossie Scarfone C. Donzetta Matters, MD [facility] Office: [phone] Pager: [phone]  08/30/2018 10:16 AM

## 2018-08-30 NOTE — Progress Notes (Signed)
[place] for Heparin Indication: arterial occlusion, now post-op  Allergies  Allergen Reactions  . Warfarin Palpitations  . Doxycycline Rash  . Aricept [Donepezil Hcl] Other (See Comments)    Insomnia and not eating     Patient Measurements:   Heparin Dosing Weight: 75 kg  Vital Signs: Temp: 98.1 F (36.7 C) (01/31 1926) Temp Source: Oral (01/31 1926) BP: 178/70 (01/31 1926) Pulse Rate: 67 (01/31 1926)  Labs: Recent Labs    08/29/18 2119 08/30/18 0527 08/30/18 2038  HGB 11.8* 11.8*  --   HCT 36.6 35.7*  --   PLT 159 156  --   HEPARINUNFRC  --   --  0.50  CREATININE 1.47* 1.32*  --     CrCl cannot be calculated (Unknown ideal weight.).   Medical History: Past Medical History:  Diagnosis Date  . Allergic rhinitis, cause unspecified 01/01/2014  . Anxiety state, unspecified 01/01/2014  . Cataract   . Cognitive impairment   . Depression 01/01/2014  . Glaucoma   . Hypertension   . Other and unspecified hyperlipidemia 01/01/2014  . Renal insufficiency 01/01/2014  . Stroke [facility]) 2002   CVA x 2  . Unspecified asthma(493.90) 01/01/2014  . Vision decreased     Medications:  No current facility-administered medications on file prior to encounter.    Current Outpatient Medications on File Prior to Encounter  Medication Sig Dispense Refill  . alendronate (FOSAMAX) 70 MG tablet Take 1 tablet (70 mg total) by mouth once a week. Take with a full glass of water on an empty stomach. 4 tablet 11  . ALPRAZolam (XANAX) 0.5 MG tablet Take 1 tablet (0.5 mg total) by mouth 2 (two) times daily. (Patient taking differently: Take 0.5 mg by mouth 3 (three) times daily as needed for anxiety (2-3 times daily). ) 60 tablet 0  . atorvastatin (LIPITOR) 20 MG tablet Take 1 tablet (20 mg total) by mouth daily. 90 tablet 3  . brimonidine (ALPHAGAN) 0.2 % ophthalmic solution Place 1 drop into both eyes 2 (two) times daily.     . dorzolamide-timolol (COSOPT)  22.3-6.8 MG/ML ophthalmic solution Place 1 drop into both eyes 2 (two) times daily.    Marland Kitchen labetalol (NORMODYNE) 200 MG tablet TAKE 1 TABLET BY MOUTH TWICE DAILY (Patient taking differently: Take 200 mg by mouth 2 (two) times daily. ) 60 tablet 2  . RHOPRESSA 0.02 % SOLN Place 1 drop into the left eye daily.     Marland Kitchen spironolactone (ALDACTONE) 25 MG tablet TAKE 1 TABLET (25 MG TOTAL) BY MOUTH EVERY MORNING. (Patient taking differently: Take 25 mg by mouth daily. ) 30 tablet 3  . travoprost, benzalkonium, (TRAVATAN) 0.004 % ophthalmic solution Place 1 drop into both eyes at bedtime.     . verapamil (CALAN-SR) 240 MG CR tablet TAKE 1 TABLET BY MOUTH AT BEDTIME (Patient taking differently: Take 240 mg by mouth daily. ) 30 tablet 9  . atorvastatin (LIPITOR) 10 MG tablet Take 1 tablet (10 mg total) by mouth daily. (Patient not taking: Reported on 08/29/2018) 90 tablet 3     Assessment: 73 y.o. female with L SCA occlusion for heparin. No anticoag PTA.  Underwent LUE thromboembolectomy on 1/31. Heparin level this evening came back at 0.5 - heparin infusion running into R hand, level drawn from R foot (due to limited access). Slight bleeding noticed at IV sites - will monitor closely. Hgb 11.8, plt 156. No infusion issues.   Goal of Therapy:  Heparin level 0.3-0.7 units/ml Monitor platelets by anticoagulation protocol: Yes   Plan:  Continue heparin at 850 units/hr Monitor daily HL, CBC, and for s/sx of bleeding F/u on anticoagulation plan at discharge  Antonietta Jewel, PharmD, [place] Clinical Pharmacist  Pager: [phone] Phone: [phone]

## 2018-08-30 NOTE — Progress Notes (Signed)
Call to Dr Donzetta Matters that wound has gotten a little larger with some bleeding from site.  Order to place Ace wrap around wound.  Informed did place foley cath due to urinary retention .

## 2018-08-30 NOTE — Op Note (Signed)
    Patient name: Stacy Brennan MRN: [id number] DOB: [birth year] Sex: female  08/30/2018 Pre-operative Diagnosis: Acute left upper extremity ischemia Post-operative diagnosis:  Same Surgeon:  Erlene Quan C. Donzetta Matters, MD Assistant: Arlee Muslim, PA Procedure Performed: 1.  Exposure left brachial artery 2.  Left upper extremity thromboembolectomy  Indications: 73 year old female presented with acute left upper extremity numbness.  CT scan demonstrated what appeared to be acute thrombus in her left subclavian artery at the level of the first rib.  She was therefore indicated for left upper extremity thromboembolectomy and other indicated procedures.  Findings: Acute thrombus was returned via the brachial artery approach.  There was very strong antegrade bleeding at completion.  No thrombus was retrieved from distal.  There is a palpable radial pulse at completion.   Procedure:  The patient was identified in the holding area and taken to the operating room where she is placed upon the operative table and general anesthesia induced.  She was sterilely prepped and draped in the left neck chest and upper extremity, antibiotics were administered and a timeout was called.  I used ultrasound to evaluate the brachial arteries and below the antecubital and they were quite diminutive.  I therefore made a longitudinal incision above the antecubitum dissected down identified the median nerve and protected this.  Vesseloops were placed around the brachial artery and 5000 units of heparin was administered.  After circulating for 2 minutes I made a transverse arteriotomy.  I first passed the Fogarty catheter distal return no clot.  I then spastic proximal and did return what appeared to be acute appearing thrombus.  Catheter did pass easily for 3 times until we had no return of thrombus had very strong antegrade bleeding and what appeared to be the plug.  I irrigated the artery with heparinized saline and closed it with 6-0  Prolene suture.  There is a palpable radial pulse the wrist confirmed with Doppler good signal in the brachial artery.  Satisfied with this we obtained hemostasis irrigated closed in layers with Vicryl Monocryl.  Dermabond placed to level skin.  She was awake from anesthesia having tolerated procedure without immediate complication.  All counts were correct at completion.  EBL: 100 cc    Mauriana Dann C. Donzetta Matters, MD [facility] [place] Office: [phone] Pager: [phone]

## 2018-08-30 NOTE — Anesthesia Postprocedure Evaluation (Signed)
Anesthesia Post Note  Patient: Stacy Brennan  Procedure(s) Performed: LEFT SUBCLAVIAN ARTERY THROMBECTOMY (Left )     Patient location during evaluation: PACU Anesthesia Type: General Level of consciousness: awake and alert Pain management: pain level controlled Vital Signs Assessment: post-procedure vital signs reviewed and stable Respiratory status: spontaneous breathing, nonlabored ventilation, respiratory function stable and patient connected to nasal cannula oxygen Cardiovascular status: blood pressure returned to baseline and stable Postop Assessment: no apparent nausea or vomiting Anesthetic complications: no    Last Vitals:  Vitals:   08/30/18 0311 08/30/18 0445  BP: (!) 143/67 (!) 173/78  Pulse: 67 68  Resp: 14 14  Temp: 36.6 C 36.7 C  SpO2: 95% 96%    Last Pain:  Vitals:   08/30/18 0445  TempSrc: Oral  PainSc: 0-No pain                 Weslee Prestage DAVID

## 2018-08-30 NOTE — Progress Notes (Signed)
Assisted with admission to room 4N06. Attempted to use bedpan. Unsuccessful. Noted bladder distention and unable to void. Foley inserted. Immediate return of 564ml clear yellow urine.  [place] Faster

## 2018-08-31 LAB — CBC
HEMATOCRIT: 39.5 % (ref 36.0–46.0)
Hemoglobin: 12.7 g/dL (ref 12.0–15.0)
MCH: 29.6 pg (ref 26.0–34.0)
MCHC: 32.2 g/dL (ref 30.0–36.0)
MCV: 92.1 fL (ref 80.0–100.0)
Platelets: 200 10*3/uL (ref 150–400)
RBC: 4.29 MIL/uL (ref 3.87–5.11)
RDW: 12.8 % (ref 11.5–15.5)
WBC: 10.6 10*3/uL — AB (ref 4.0–10.5)
nRBC: 0 % (ref 0.0–0.2)

## 2018-08-31 LAB — HEPARIN LEVEL (UNFRACTIONATED): Heparin Unfractionated: 0.38 IU/mL (ref 0.30–0.70)

## 2018-08-31 MED ORDER — RIVAROXABAN 15 MG PO TABS
15.0000 mg | ORAL_TABLET | Freq: Two times a day (BID) | ORAL | Status: DC
  Administered 2018-08-31 – 2018-09-01 (×2): 15 mg via ORAL
  Filled 2018-08-31 (×2): qty 1

## 2018-08-31 MED ORDER — RIVAROXABAN 20 MG PO TABS: 20.0000 mg | ORAL_TABLET | Freq: Every day | ORAL | Status: DC

## 2018-08-31 NOTE — Progress Notes (Addendum)
On call paged to obtain order for lab to stick feet; pt LUE restricted and RUE infusing heparin gtt. 1st page [phone], 2nd page [phone], 3rd page [phone].

## 2018-08-31 NOTE — Progress Notes (Signed)
Call to Dr Early that patient will not get back in the bed without a lot of , follow instructions, extremely confused, and hitting at staff. Orders received.

## 2018-08-31 NOTE — Progress Notes (Signed)
ANTICOAGULATION CONSULT NOTE - Initial Consult  Pharmacy Consult for Xarelto Indication: Embolus to left arm  Allergies  Allergen Reactions  . Warfarin Palpitations  . Doxycycline Rash  . Aricept [Donepezil Hcl] Other (See Comments)    Insomnia and not eating     Patient Measurements: Height: 5\' 6"  (167.6 cm) Weight: 172 lb 9.9 oz (78.3 kg) IBW/kg (Calculated) : 59.3  Vital Signs: Temp: 99.3 F (37.4 C) (02/01 0800) Temp Source: Axillary (02/01 0800) BP: 142/66 (02/01 1130) Pulse Rate: 72 (02/01 1130)  Labs: Recent Labs    08/29/18 2119 08/30/18 0527 08/30/18 2038 08/31/18 0458 08/31/18 1014  HGB 11.8* 11.8*  --   --  12.7  HCT 36.6 35.7*  --   --  39.5  PLT 159 156  --   --  200  HEPARINUNFRC  --   --  0.50 0.38  --   CREATININE 1.47* 1.32*  --   --   --     Estimated Creatinine Clearance: 40.1 mL/min (A) (by C-G formula based on SCr of 1.32 mg/dL (H)).   Medical History: Past Medical History:  Diagnosis Date  . Allergic rhinitis, cause unspecified 01/01/2014  . Anxiety state, unspecified 01/01/2014  . Cataract   . Cognitive impairment   . Depression 01/01/2014  . Glaucoma   . Hypertension   . Other and unspecified hyperlipidemia 01/01/2014  . Renal insufficiency 01/01/2014  . Stroke [facility]) 2002   CVA x 2  . Unspecified asthma(493.90) 01/01/2014  . Vision decreased     Assessment: 73 y.o. female with L SCA occlusion for heparin. No anticoag PTA. Underwent LUE thromboembolectomy on 1/31. Pharmacy has been consulted to transition patient from heparin to Xarelto. Confirmed with vascular surgery that patient is to be treated with the venous thromboembolism dosing of xarelto. Patient's CBC is stable and there have been no s/sx of bleeding per RN today.   Plan:  Stop heparin infusion on 08/31/18 at 1800 Start Xarelto 15 mg PO BID with food for 21 days on 08/31/18 at 1800, followed by 20 mg once daily with food Monitor for signs/symptoms of bleeding  Thank you for  allowing pharmacy to be a part of this patient's care.  Leron Croak, PharmD PGY1 Pharmacy Resident Phone: [phone]  Please check AMION for all [place] phone numbers 08/31/2018,1:50 PM

## 2018-08-31 NOTE — Progress Notes (Signed)
[place] for Heparin Indication: arterial occlusion, now post-op  Allergies  Allergen Reactions  . Warfarin Palpitations  . Doxycycline Rash  . Aricept [Donepezil Hcl] Other (See Comments)    Insomnia and not eating     Patient Measurements:   Heparin Dosing Weight: 75 kg  Vital Signs: Temp: 99.3 F (37.4 C) (02/01 0800) Temp Source: Axillary (02/01 0800) BP: 175/64 (02/01 0800) Pulse Rate: 78 (02/01 0800)  Labs: Recent Labs    08/29/18 2119 08/30/18 0527 08/30/18 2038 08/31/18 0458 08/31/18 1014  HGB 11.8* 11.8*  --   --  12.7  HCT 36.6 35.7*  --   --  39.5  PLT 159 156  --   --  200  HEPARINUNFRC  --   --  0.50 0.38  --   CREATININE 1.47* 1.32*  --   --   --     CrCl cannot be calculated (Unknown ideal weight.).   Medical History: Past Medical History:  Diagnosis Date  . Allergic rhinitis, cause unspecified 01/01/2014  . Anxiety state, unspecified 01/01/2014  . Cataract   . Cognitive impairment   . Depression 01/01/2014  . Glaucoma   . Hypertension   . Other and unspecified hyperlipidemia 01/01/2014  . Renal insufficiency 01/01/2014  . Stroke [facility]) 2002   CVA x 2  . Unspecified asthma(493.90) 01/01/2014  . Vision decreased     Medications:  No current facility-administered medications on file prior to encounter.    Current Outpatient Medications on File Prior to Encounter  Medication Sig Dispense Refill  . alendronate (FOSAMAX) 70 MG tablet Take 1 tablet (70 mg total) by mouth once a week. Take with a full glass of water on an empty stomach. 4 tablet 11  . ALPRAZolam (XANAX) 0.5 MG tablet Take 1 tablet (0.5 mg total) by mouth 2 (two) times daily. (Patient taking differently: Take 0.5 mg by mouth 3 (three) times daily as needed for anxiety (2-3 times daily). ) 60 tablet 0  . atorvastatin (LIPITOR) 20 MG tablet Take 1 tablet (20 mg total) by mouth daily. 90 tablet 3  . brimonidine (ALPHAGAN) 0.2 % ophthalmic solution Place  1 drop into both eyes 2 (two) times daily.     . dorzolamide-timolol (COSOPT) 22.3-6.8 MG/ML ophthalmic solution Place 1 drop into both eyes 2 (two) times daily.    Marland Kitchen labetalol (NORMODYNE) 200 MG tablet TAKE 1 TABLET BY MOUTH TWICE DAILY (Patient taking differently: Take 200 mg by mouth 2 (two) times daily. ) 60 tablet 2  . RHOPRESSA 0.02 % SOLN Place 1 drop into the left eye daily.     Marland Kitchen spironolactone (ALDACTONE) 25 MG tablet TAKE 1 TABLET (25 MG TOTAL) BY MOUTH EVERY MORNING. (Patient taking differently: Take 25 mg by mouth daily. ) 30 tablet 3  . travoprost, benzalkonium, (TRAVATAN) 0.004 % ophthalmic solution Place 1 drop into both eyes at bedtime.     . verapamil (CALAN-SR) 240 MG CR tablet TAKE 1 TABLET BY MOUTH AT BEDTIME (Patient taking differently: Take 240 mg by mouth daily. ) 30 tablet 9  . atorvastatin (LIPITOR) 10 MG tablet Take 1 tablet (10 mg total) by mouth daily. (Patient not taking: Reported on 08/29/2018) 90 tablet 3     Assessment: 73 y.o. female with L SCA occlusion for heparin. No anticoag PTA. Underwent LUE thromboembolectomy on 1/31.   Heparin level this AM is therapeutic at 0.38  - confirmed that heparin infusion is still running into R hand,  level drawn from R foot (due to limited access). CBC stable. No issues with infusions or s/sx of bleeding per RN.    Goal of Therapy:  Heparin level 0.3-0.7 units/ml Monitor platelets by anticoagulation protocol: Yes   Plan:  Continue heparin at 850 units/hr Monitor daily HL, CBC, and for s/sx of bleeding F/u on anticoagulation plan at discharge  Thank you for allowing pharmacy to be a part of this patient's care.  Leron Croak, PharmD PGY1 Pharmacy Resident Phone: [phone]  Please check AMION for all [place] phone numbers

## 2018-08-31 NOTE — Discharge Instructions (Signed)
Information on my medicine - XARELTO (rivaroxaban)  This medication education was reviewed with me or my healthcare representative as part of my discharge preparation.  WHY WAS XARELTO PRESCRIBED FOR YOU? Xarelto was prescribed to treat blood clots that may have been found in the veins of your legs (deep vein thrombosis) or in your lungs (pulmonary embolism) and to reduce the risk of them occurring again.  What do you need to know about Xarelto? The starting dose is one 15 mg tablet taken TWICE daily with food for the FIRST 21 DAYS then on (09/21/18)  the dose is changed to one 20 mg tablet taken ONCE A DAY with your evening meal.  DO NOT stop taking Xarelto without talking to the health care provider who prescribed the medication.  Refill your prescription for 20 mg tablets before you run out.  After discharge, you should have regular check-up appointments with your healthcare provider that is prescribing your Xarelto.  In the future your dose may need to be changed if your kidney function changes by a significant amount.  What do you do if you miss a dose? If you are taking Xarelto TWICE DAILY and you miss a dose, take it as soon as you remember. You may take two 15 mg tablets (total 30 mg) at the same time then resume your regularly scheduled 15 mg twice daily the next day.  If you are taking Xarelto ONCE DAILY and you miss a dose, take it as soon as you remember on the same day then continue your regularly scheduled once daily regimen the next day. Do not take two doses of Xarelto at the same time.   Important Safety Information Xarelto is a blood thinner medicine that can cause bleeding. You should call your healthcare provider right away if you experience any of the following: ? Bleeding from an injury or your nose that does not stop. ? Unusual colored urine (red or dark brown) or unusual colored stools (red or black). ? Unusual bruising for unknown reasons. ? A serious fall or  if you hit your head (even if there is no bleeding).  Some medicines may interact with Xarelto and might increase your risk of bleeding while on Xarelto. To help avoid this, consult your healthcare provider or pharmacist prior to using any new prescription or non-prescription medications, including herbals, vitamins, non-steroidal anti-inflammatory drugs (NSAIDs) and supplements.  This website has more information on Xarelto: [email or web address]-benson.[email or web address]

## 2018-08-31 NOTE — Care Management (Signed)
Spoke w patient's daughter at bedside. Given 30 day Xaralto coupon. She understands to present this to pharmacist for first 30 days free. She understands to ask what the price will be next month and to discuss with PCP other options if too expensive.

## 2018-08-31 NOTE — Progress Notes (Signed)
Subjective: Interval History: none.. Oriented currently.  Her daughter is in the room with her  Objective: Vital signs in last 24 hours: Temp:  [98.1 F (36.7 C)-99.3 F (37.4 C)] 99.3 F (37.4 C) (02/01 0800) Pulse Rate:  [66-86] 72 (02/01 1130) Resp:  [9-26] 16 (02/01 1130) BP: (142-178)/(61-81) 142/66 (02/01 1130) SpO2:  [93 %-98 %] 98 % (02/01 1130) Weight:  [78.3 kg] 78.3 kg (02/01 1323)  Intake/Output from previous day: 01/31 0701 - 02/01 0700 In: 1870 [P.O.:1080; I.V.:590; IV Piggyback:200] Out: 1125 [Urine:1125] Intake/Output this shift: Total I/O In: 120 [P.O.:120] Out: 500 [Urine:500]  No evidence of hematoma left antecubital incision.  2-3+ radial pulse  Lab Results: Recent Labs    08/30/18 0527 08/31/18 1014  WBC 7.9 10.6*  HGB 11.8* 12.7  HCT 35.7* 39.5  PLT 156 200   BMET Recent Labs    08/29/18 2119 08/30/18 0527  NA 134* 137  K 4.6 4.5  CL 104 104  CO2 21* 23  GLUCOSE 104* 102*  BUN 28* 23  CREATININE 1.47* 1.32*  CALCIUM 9.7 9.5    Studies/Results: Ct Angio Up Extrem Left W &/or Wo Contast  Result Date: 08/29/2018 CLINICAL DATA:  Left upper extremity weakness with weak radial pulse on the left. Fever yesterday. Numbness and tingling of the left hand. EXAM: CT ANGIOGRAPHY OF THE left upperEXTREMITY TECHNIQUE: Multidetector CT imaging of the left upperwas performed using the standard protocol during bolus administration of intravenous contrast. Multiplanar CT image reconstructions and MIPs were obtained to evaluate the vascular anatomy. CONTRAST:  76mL ISOVUE-370 IOPAMIDOL (ISOVUE-370) INJECTION 76% COMPARISON:  None. FINDINGS: Vascular: There is segmental occlusion likely representing thrombosis of the left subclavian artery beginning just distal to the origin of the vertebral artery and extending to the axillary artery. There is reconstitution at the axillary artery, likely from chest wall collaterals. Flow is then demonstrated in the axillary  artery, brachial artery, and radial artery although diffusely diminished flow is demonstrated in the ulnar artery. There appears to be high-grade stenosis or occlusion of the origin of the ulnar artery. Soft tissues: No soft tissue mass or hematoma is identified. Muscular fat planes appear intact. Visualized chest and abdomen: Normal caliber patent thoracic aorta without evidence of dissection. Great vessel origins are patent. No visualized mediastinal mass or lymphadenopathy. Atelectasis in the left lung base. Left renal cyst. Atherosclerotic calcifications in the aorta. Narrowing of the origin of the left common iliac artery. Review of the MIP images confirms the above findings. 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 There is reconstitution at the axillary artery, likely from chest wall collaterals. There also appears to be focal high-grade stenosis or occlusion of the origin of the ulnar artery. Electronically Signed   By: Lucienne Capers M.D.   On: 08/29/2018 22:59   Anti-infectives: Anti-infectives (From admission, onward)   Start     Dose/Rate Route Frequency Ordered Stop   08/30/18 1000  ceFAZolin (ANCEF) IVPB 2g/100 mL premix     2 g 200 mL/hr over 30 Minutes Intravenous Every 8 hours 08/30/18 0338 08/30/18 1743      Assessment/Plan: s/p Procedure(s): LEFT SUBCLAVIAN ARTERY THROMBECTOMY (Left) Stable overall.  2D echocardiogram showed no evidence of embolic source.  Had confusion last night requiring Haldol and soft restraints.  Daughter reports that she does become confused at times.  Is back at her baseline.  I explained that  this may recur tonight with sundowning.  Will convert to Xarelto.  Possible discharge in a.m.  Will consult case management to assure coverage of Xarelto on discharge   LOS: 1 day   Naela Nodal 08/31/2018, 1:52 PM

## 2018-09-01 LAB — CBC
HCT: 35.6 % — ABNORMAL LOW (ref 36.0–46.0)
Hemoglobin: 11.8 g/dL — ABNORMAL LOW (ref 12.0–15.0)
MCH: 30.2 pg (ref 26.0–34.0)
MCHC: 33.1 g/dL (ref 30.0–36.0)
MCV: 91 fL (ref 80.0–100.0)
Platelets: 182 10*3/uL (ref 150–400)
RBC: 3.91 MIL/uL (ref 3.87–5.11)
RDW: 12.8 % (ref 11.5–15.5)
WBC: 12.4 10*3/uL — ABNORMAL HIGH (ref 4.0–10.5)
nRBC: 0 % (ref 0.0–0.2)

## 2018-09-01 MED ORDER — RIVAROXABAN 15 MG PO TABS: 15.0000 mg | ORAL_TABLET | Freq: Two times a day (BID) | ORAL | 6 refills | Status: DC

## 2018-09-01 MED ORDER — TRAMADOL HCL 50 MG PO TABS: 50.0000 mg | ORAL_TABLET | Freq: Four times a day (QID) | ORAL | 0 refills | Status: DC | PRN

## 2018-09-01 NOTE — Progress Notes (Addendum)
D/c instructions given to patient, daughter and grandson, questions answered; no printed prescriptions to give. IV removed. Patient d/c with all belongings, taken to car via wheelchair.

## 2018-09-01 NOTE — Progress Notes (Signed)
Subjective: Interval History: none.. Less agitated last night.  Daughter in room again this morning.  Reports that she was awake and for routine check and was angry after this.  Objective: Vital signs in last 24 hours: Temp:  [98.3 F (36.8 C)-99.6 F (37.6 C)] 99.5 F (37.5 C) (02/02 0831) Pulse Rate:  [57-81] 81 (02/02 0831) Resp:  [16-24] 18 (02/02 0831) BP: (114-178)/(52-66) 114/52 (02/02 0831) SpO2:  [97 %-100 %] 100 % (02/02 0831) Weight:  [78.3 kg] 78.3 kg (02/01 1323)  Intake/Output from previous day: 02/01 0701 - 02/02 0700 In: 600 [P.O.:600] Out: 900 [Urine:900] Intake/Output this shift: No intake/output data recorded.  2-3+ left radial pulse.  Wound without hematoma  Lab Results: Recent Labs    08/31/18 1014 09/01/18 0458  WBC 10.6* 12.4*  HGB 12.7 11.8*  HCT 39.5 35.6*  PLT 200 182   BMET Recent Labs    08/29/18 2119 08/30/18 0527  NA 134* 137  K 4.6 4.5  CL 104 104  CO2 21* 23  GLUCOSE 104* 102*  BUN 28* 23  CREATININE 1.47* 1.32*  CALCIUM 9.7 9.5    Studies/Results: Ct Angio Up Extrem Left W &/or Wo Contast  Result Date: 08/29/2018 CLINICAL DATA:  Left upper extremity weakness with weak radial pulse on the left. Fever yesterday. Numbness and tingling of the left hand. EXAM: CT ANGIOGRAPHY OF THE left upperEXTREMITY TECHNIQUE: Multidetector CT imaging of the left upperwas performed using the standard protocol during bolus administration of intravenous contrast. Multiplanar CT image reconstructions and MIPs were obtained to evaluate the vascular anatomy. CONTRAST:  24mL ISOVUE-370 IOPAMIDOL (ISOVUE-370) INJECTION 76% COMPARISON:  None. FINDINGS: Vascular: There is segmental occlusion likely representing thrombosis of the left subclavian artery beginning just distal to the origin of the vertebral artery and extending to the axillary artery. There is reconstitution at the axillary artery, likely from chest wall collaterals. Flow is then demonstrated in  the axillary artery, brachial artery, and radial artery although diffusely diminished flow is demonstrated in the ulnar artery. There appears to be high-grade stenosis or occlusion of the origin of the ulnar artery. Soft tissues: No soft tissue mass or hematoma is identified. Muscular fat planes appear intact. Visualized chest and abdomen: Normal caliber patent thoracic aorta without evidence of dissection. Great vessel origins are patent. No visualized mediastinal mass or lymphadenopathy. Atelectasis in the left lung base. Left renal cyst. Atherosclerotic calcifications in the aorta. Narrowing of the origin of the left common iliac artery. Review of the MIP images confirms the above findings. 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 There is reconstitution at the axillary artery, likely from chest wall collaterals. There also appears to be focal high-grade stenosis or occlusion of the origin of the ulnar artery. Electronically Signed   By: Lucienne Capers M.D.   On: 08/29/2018 22:59   Anti-infectives: Anti-infectives (From admission, onward)   Start     Dose/Rate Route Frequency Ordered Stop   08/30/18 1000  ceFAZolin (ANCEF) IVPB 2g/100 mL premix     2 g 200 mL/hr over 30 Minutes Intravenous Every 8 hours 08/30/18 0338 08/30/18 1743      Assessment/Plan: s/p Procedure(s): LEFT SUBCLAVIAN ARTERY THROMBECTOMY (Left) Stable for discharge home today.  Started Xarelto yesterday.  We will follow-up with Dr. Donzetta Matters in 2 to 3 weeks   LOS: 2 days   Stacy Brennan 09/01/2018, 10:05 AM

## 2018-09-02 ENCOUNTER — Encounter (HOSPITAL_COMMUNITY): Payer: Self-pay | Admitting: Vascular Surgery

## 2018-09-03 ENCOUNTER — Telehealth: Payer: Self-pay | Admitting: Vascular Surgery

## 2018-09-03 NOTE — Telephone Encounter (Signed)
-----   Message from Rosetta Posner, MD sent at 09/01/2018 10:06 AM EST -----  Patient was discharged on Sunday.  Needs follow-up office visit with Dr. Donzetta Matters in 2 to 3 weeks.  Does not need noninvasive studies

## 2018-09-03 NOTE — Telephone Encounter (Signed)
sch appt vm full mld ltr 09/20/2018 230pm p/o MD

## 2018-09-04 NOTE — Discharge Summary (Signed)
Physician Discharge Summary   Patient ID: Stacy Brennan [id number] 73 y.o. Jul 13, 1946  Admit date: 08/29/2018  Discharge date and time: 09/01/2018 12:32 PM   Admitting Physician: Waynetta Sandy, MD   Discharge Physician: Dr. Donnetta Hutching  Admission Diagnoses: Acute thrombosis of left upper extremity [I82.602]  Discharge Diagnoses: same  Admission Condition: poor  Discharged Condition: fair  Indication for Admission: acute left upper extremity ischemia  Hospital Course: Stacy Brennan is a 73 year old female who presented with acute left upper extremity ischemia.  She was taken emergently to the operating room by Dr. Donzetta Matters on 08/30/2018 and underwent exposure of left brachial artery with left arm thromboembolectomy.  She tolerated this procedure well and was admitted to the hospital postoperatively with a dose specific IV heparin drip.  POD #1 she was converted to full dose IV heparin.  She underwent echocardiogram which was negative for etiology embolic source.  POD #2 she was transferred converted from IV heparin to p.o. Xarelto.  She was kept an additional day due to confusion likely related to hospital delirium.  It should also be noted that she maintained a palpable left radial pulse to the time of her discharge to home.  She will follow-up in office with Dr. Donzetta Matters in about 2 to 3 weeks.  Discharge instructions were discussed with the patient and her daughter.  She was discharged home in stable condition on Xarelto.  Consults: None  Treatments: surgery: Exposure of left brachial artery with left upper extremity thromboembolectomy by Dr. Donzetta Matters on 08/30/2018  Discharge Exam: See progress note 09/01/2018 Vitals:   09/01/18 0126 09/01/18 0831  BP:  (!) 114/52  Pulse:  81  Resp:  18  Temp: 99.6 F (37.6 C) 99.5 F (37.5 C)  SpO2:  100%     Disposition: Home  Patient Instructions:  Allergies as of 09/01/2018      Reactions   Warfarin Palpitations   Doxycycline Rash   Aricept [donepezil Hcl] Other (See Comments)   Insomnia and not eating       Medication List    TAKE these medications   alendronate 70 MG tablet Commonly known as:  FOSAMAX Take 1 tablet (70 mg total) by mouth once a week. Take with a full glass of water on an empty stomach.   ALPRAZolam 0.5 MG tablet Commonly known as:  XANAX Take 1 tablet (0.5 mg total) by mouth 2 (two) times daily. What changed:    when to take this  reasons to take this   atorvastatin 10 MG tablet Commonly known as:  LIPITOR Take 1 tablet (10 mg total) by mouth daily.   atorvastatin 20 MG tablet Commonly known as:  LIPITOR Take 1 tablet (20 mg total) by mouth daily.   brimonidine 0.2 % ophthalmic solution Commonly known as:  ALPHAGAN Place 1 drop into both eyes 2 (two) times daily.   dorzolamide-timolol 22.3-6.8 MG/ML ophthalmic solution Commonly known as:  COSOPT Place 1 drop into both eyes 2 (two) times daily.   labetalol 200 MG tablet Commonly known as:  NORMODYNE TAKE 1 TABLET BY MOUTH TWICE DAILY   RHOPRESSA 0.02 % Soln Generic drug:  Netarsudil Dimesylate Place 1 drop into the left eye daily.   Rivaroxaban 15 MG Tabs tablet Commonly known as:  XARELTO Take 1 tablet (15 mg total) by mouth 2 (two) times daily.   spironolactone 25 MG tablet Commonly known as:  ALDACTONE TAKE 1 TABLET (25 MG TOTAL) BY MOUTH EVERY MORNING. What changed:  See  the new instructions.   traMADol 50 MG tablet Commonly known as:  ULTRAM Take 1 tablet (50 mg total) by mouth every 6 (six) hours as needed.   travoprost (benzalkonium) 0.004 % ophthalmic solution Commonly known as:  TRAVATAN Place 1 drop into both eyes at bedtime.   verapamil 240 MG CR tablet Commonly known as:  CALAN-SR TAKE 1 TABLET BY MOUTH AT BEDTIME What changed:  when to take this      Activity: activity as tolerated Diet: regular diet Wound Care: keep wound clean and dry  Follow-up with Dr. Donzetta Matters in 3 weeks.  SignedDagoberto Ligas 09/04/2018 2:03 PM

## 2018-09-12 ENCOUNTER — Telehealth: Payer: Self-pay

## 2018-09-12 NOTE — Telephone Encounter (Signed)
Copied from [place] [id number]. Topic: General - Other >> Sep 12, 2018 10:08 AM Judyann Munson wrote: Reason for CRM: Patient daughter is calling to request a call back to discuss the patient healthcare, she state the patient had emergency surgery on 08-30-2018 for ( blockage and blood clot in left shoulder) she stated she believes the  General anesthesia made her dementia worse because the patient is now unable to handle.  Please advise the best contact number is Joelene Millin  [phone].

## 2018-09-12 NOTE — Telephone Encounter (Signed)
Spoke with pt's daughter as Dr. Tami Ribas was still listed as PCP in pt's chart. She reports pt will not be seeing Dr. Tami Ribas any longer. Chart updated to [facility] as PCP. Daughter also comments she is wanting to discuss placing pt in long-term care facility. Explained Memory Care vs SNF and that Tommi Rumps would be able to assist with helping to determine level of care needed. She is looking at facilities this week and is hoping to place pt soon. Advised once she has chosen facility we would need to complete forms, likely TB skin test and OV as we have not seen pt in several months. She has scheduled OV for 09/25/18.

## 2018-09-20 ENCOUNTER — Encounter: Payer: Medicare Other | Admitting: Vascular Surgery

## 2018-09-20 ENCOUNTER — Other Ambulatory Visit: Payer: Self-pay | Admitting: *Deleted

## 2018-09-20 DIAGNOSIS — I708 Atherosclerosis of other arteries: Secondary | ICD-10-CM

## 2018-09-20 MED ORDER — RIVAROXABAN 20 MG PO TABS: 20.0000 mg | ORAL_TABLET | Freq: Every day | ORAL | 6 refills | Status: DC

## 2018-09-25 ENCOUNTER — Encounter: Payer: Self-pay | Admitting: Adult Health

## 2018-09-25 ENCOUNTER — Ambulatory Visit (INDEPENDENT_AMBULATORY_CARE_PROVIDER_SITE_OTHER): Payer: Medicare Other | Admitting: Adult Health

## 2018-09-25 VITALS — BP 164/90 | Temp 97.4°F | Wt 160.0 lb

## 2018-09-25 DIAGNOSIS — I708 Atherosclerosis of other arteries: Secondary | ICD-10-CM

## 2018-09-25 DIAGNOSIS — K625 Hemorrhage of anus and rectum: Secondary | ICD-10-CM

## 2018-09-25 DIAGNOSIS — Z23 Encounter for immunization: Secondary | ICD-10-CM | POA: Diagnosis not present

## 2018-09-25 DIAGNOSIS — F0391 Unspecified dementia with behavioral disturbance: Secondary | ICD-10-CM | POA: Diagnosis not present

## 2018-09-25 MED ORDER — TRAZODONE HCL 50 MG PO TABS: 25.0000 mg | ORAL_TABLET | Freq: Every evening | ORAL | 3 refills | Status: DC | PRN

## 2018-09-25 NOTE — Progress Notes (Signed)
Subjective:    Patient ID: Stacy Brennan, female    DOB: [birth year], 73 y.o.   MRN: [id number]  HPI   73 year old female who  has a past medical history of Allergic rhinitis, cause unspecified (01/01/2014), Anxiety state, unspecified (01/01/2014), Cataract, Cognitive impairment, Depression (01/01/2014), Glaucoma, Hypertension, Other and unspecified hyperlipidemia (01/01/2014), Renal insufficiency (01/01/2014), Stroke ([place]) (2002), Unspecified asthma(493.90) (01/01/2014), and Vision decreased. She presents to the office today with her daughter who helps provide history   Report daughter is worried about worsening status seems to have gotten worse after emergent surgery at the end of January for acute thrombosis of the left upper extremity.  Stacy has a long history of cognitive impairment as far back as when I started seeing her in 2016.  She has been started on Aricept in the past but she did not respond well to this medication and had a paradoxical effect.  Also tried Namenda in the past with good success.  Her last Mini-Mental status exam was in 2016, which was tailored due to her back surgery at the time and she was unable to complete the reading and writing as well as a copy and portion of the test.  She did fairly well on this exam but was unable to complete completely spell the word "world" backwards.  Her daughter reports that she is getting lost while in the house, having wandering behavior, as well as visual and possibly auditory hallucinations.  She does not get out of bed much throughout the day and her appetite has been decreasing.  She has been falling at home, all without injury.  Normally, her daughter reports that she noticed red blood in the toilet bowl earlier this week.  She does report hard small circular balls of stool in the toilet, has not noticed any blood on her underwear, clots, or blood on toilet paper since this incident.  Should be noted that she was started on Xarelto at the  end of January after her emergency surgery.  Review of Systems See HPI   Past Medical History:  Diagnosis Date  . Allergic rhinitis, cause unspecified 01/01/2014  . Anxiety state, unspecified 01/01/2014  . Cataract   . Cognitive impairment   . Depression 01/01/2014  . Glaucoma   . Hypertension   . Other and unspecified hyperlipidemia 01/01/2014  . Renal insufficiency 01/01/2014  . Stroke [facility]) 2002   CVA x 2  . Unspecified asthma(493.90) 01/01/2014  . Vision decreased     Social History   Socioeconomic History  . Marital status: Single    Spouse name: Not on file  . Number of children: 4  . Years of education: 68  . Highest education level: Not on file  Occupational History  . Occupation: Financial risk analyst  . Financial resource strain: Not on file  . Food insecurity:    Worry: Not on file    Inability: Not on file  . Transportation needs:    Medical: Not on file    Non-medical: Not on file  Tobacco Use  . Smoking status: Former Smoker    Last attempt to quit: 03/20/2002    Years since quitting: 16.5  . Smokeless tobacco: Current User    Types: Snuff  Substance and Sexual Activity  . Alcohol use: No  . Drug use: No  . Sexual activity: Not on file  Lifestyle  . Physical activity:    Days per week: Not on file    Minutes per  session: Not on file  . Stress: Not on file  Relationships  . Social connections:    Talks on phone: Not on file    Gets together: Not on file    Attends religious service: Not on file    Active member of club or organization: Not on file    Attends meetings of clubs or organizations: Not on file    Relationship status: Not on file  . Intimate partner violence:    Fear of current or ex partner: Not on file    Emotionally abused: Not on file    Physically abused: Not on file    Forced sexual activity: Not on file  Other Topics Concern  . Not on file  Social History Narrative   Patient is single with 4 children.   Patient is  right handed.   Patient has a high school education.   Patient drinks 1 cup a week.    Past Surgical History:  Procedure Laterality Date  . DILATION AND CURETTAGE OF UTERUS    . THROMBECTOMY BRACHIAL ARTERY Left 08/30/2018   Procedure: LEFT SUBCLAVIAN ARTERY THROMBECTOMY;  Surgeon: Waynetta Sandy, MD;  Location: [facility];  Service: Vascular;  Laterality: Left;  . [place]      Family History  Problem Relation Age of Onset  . Cancer Other        prostate cancer  . Hypertension Other   . Diabetes Other   . Thyroid disease Other   . Hyperlipidemia Other   . Hypertension Other   . Glaucoma Other   . Parkinsonism Neg Hx   . Dementia Neg Hx     Allergies  Allergen Reactions  . Warfarin Palpitations  . Doxycycline Rash  . Aricept [Donepezil Hcl] Other (See Comments)    Insomnia and not eating     Current Outpatient Medications on File Prior to Visit  Medication Sig Dispense Refill  . alendronate (FOSAMAX) 70 MG tablet Take 1 tablet (70 mg total) by mouth once a week. Take with a full glass of water on an empty stomach. 4 tablet 11  . ALPRAZolam (XANAX) 0.5 MG tablet Take 1 tablet (0.5 mg total) by mouth 2 (two) times daily. (Patient taking differently: Take 0.5 mg by mouth 3 (three) times daily as needed for anxiety (2-3 times daily). ) 60 tablet 0  . atorvastatin (LIPITOR) 10 MG tablet Take 1 tablet (10 mg total) by mouth daily. 90 tablet 3  . atorvastatin (LIPITOR) 20 MG tablet Take 1 tablet (20 mg total) by mouth daily. 90 tablet 3  . brimonidine (ALPHAGAN) 0.2 % ophthalmic solution Place 1 drop into both eyes 2 (two) times daily.     . dorzolamide-timolol (COSOPT) 22.3-6.8 MG/ML ophthalmic solution Place 1 drop into both eyes 2 (two) times daily.    Marland Kitchen labetalol (NORMODYNE) 200 MG tablet TAKE 1 TABLET BY MOUTH TWICE DAILY (Patient taking differently: Take 200 mg by mouth 2 (two) times daily. ) 60 tablet 2  . RHOPRESSA 0.02 % SOLN Place 1 drop into the left eye  daily.     . rivaroxaban (XARELTO) 20 MG TABS tablet Take 1 tablet (20 mg total) by mouth daily with supper. 30 tablet 6  . spironolactone (ALDACTONE) 25 MG tablet TAKE 1 TABLET (25 MG TOTAL) BY MOUTH EVERY MORNING. (Patient taking differently: Take 25 mg by mouth daily. ) 30 tablet 3  . traMADol (ULTRAM) 50 MG tablet Take 1 tablet (50 mg total) by mouth every 6 (six) hours  as needed. 20 tablet 0  . travoprost, benzalkonium, (TRAVATAN) 0.004 % ophthalmic solution Place 1 drop into both eyes at bedtime.     . verapamil (CALAN-SR) 240 MG CR tablet TAKE 1 TABLET BY MOUTH AT BEDTIME (Patient taking differently: Take 240 mg by mouth daily. ) 30 tablet 9   No current facility-administered medications on file prior to visit.     BP (!) 164/90   Temp (!) 97.4 F (36.3 C)   Wt 160 lb (72.6 kg)   BMI 25.82 kg/m       Objective:   Physical Exam Vitals signs and nursing note reviewed.  Constitutional:      Appearance: Normal appearance.  Cardiovascular:     Rate and Rhythm: Normal rate and regular rhythm.     Pulses: Normal pulses.     Heart sounds: Normal heart sounds.  Pulmonary:     Effort: Pulmonary effort is normal.     Breath sounds: Normal breath sounds.  Genitourinary:    Rectum: Guaiac result negative. External hemorrhoid present. No anal fissure or internal hemorrhoid. Normal anal tone.  Musculoskeletal: Normal range of motion.  Skin:    General: Skin is warm and dry.  Neurological:     General: No focal deficit present.     Mental Status: She is alert and oriented to person, place, and time.  Psychiatric:        Mood and Affect: Mood and affect normal.        Speech: Speech normal.        Behavior: Behavior normal.        Thought Content: Thought content normal.        Cognition and Memory: Cognition is impaired. Memory is impaired. She exhibits impaired recent memory and impaired remote memory.        Judgment: Judgment normal.       Assessment & Plan:  1. Dementia  with behavioral disturbance, unspecified dementia type ([place]) -Mini-Mental status exam was again delivered due to loss of vision.  She was able to perform immediate recall, naming, repetition, and 3 stage command.  She failed delayed verbal recall, attention, oriented to time, and oriented to place. -Poke at length with her daughter about these findings.  Her daughter feels as though she is at her wits end, and is considering placement.  Advised daughter to tour some memory care units [place] and find one that fits with the patient.  Will send in trazodone to try out for wandering behavior and sundowning syndrome.  Advised follow-up in 1 week to see how she is doing - traZODone (DESYREL) 50 MG tablet; Take 0.5-1 tablets (25-50 mg total) by mouth at bedtime as needed for sleep.  Dispense: 30 tablet; Refill: 3  2. Need for 23-polyvalent pneumococcal polysaccharide vaccine  - Pneumococcal polysaccharide vaccine 23-valent greater than or equal to 2yo subcutaneous/IM  3. Rectal bleeding -Occult negative.  Blood in toilet bowl was likely due to external hemorrhoid.  Advised fiber supplement to help with constipation or hard stools.  Follow-up if she continues to notice rectal bleeding  Greater than 60 minutes was spent on this visit discussing plan of care, treatment options, and exam.  Dorothyann Peng, NP

## 2018-09-26 ENCOUNTER — Encounter: Payer: Self-pay | Admitting: Family

## 2018-09-26 ENCOUNTER — Ambulatory Visit (INDEPENDENT_AMBULATORY_CARE_PROVIDER_SITE_OTHER): Payer: Self-pay | Admitting: Physician Assistant

## 2018-09-26 ENCOUNTER — Other Ambulatory Visit: Payer: Self-pay

## 2018-09-26 VITALS — BP 132/65 | HR 63 | Temp 97.4°F | Resp 18 | Ht 66.0 in | Wt 160.0 lb

## 2018-09-26 DIAGNOSIS — I708 Atherosclerosis of other arteries: Secondary | ICD-10-CM

## 2018-09-26 NOTE — Progress Notes (Signed)
    Postoperative Visit   History of Present Illness   Stacy Brennan is a 73 y.o. year old female who presents for postoperative follow-up for: Exposure of left brachial artery with thrombo-embolectomy by Dr. Donzetta Matters on 08/30/2018.  She presented to the emergency department with a ischemic left upper arm and was brought emergently to the operating room.  Patient states her incision has healed.  She denies any return of pain, weakness, or cold feeling in left arm or hand.  She continues to take her Xarelto which is completely covered by her insurance.  During hospitalization echocardiogram and CT were negative for any embolic source.  She is able to tolerate Xarelto.  Patient's daughter states she is still having some generalized weakness in her legs with minimal activity however this seems to be getting better the further we get from the surgery.   For VQI Use Only   PRE-ADM LIVING: Home  AMB STATUS: Ambulatory   Physical Examination   Vitals:   09/26/18 1302  BP: 132/65  Pulse: 63  Resp: 18  Temp: (!) 97.4 F (36.3 C)  SpO2: 95%  Weight: 160 lb (72.6 kg)  Height: 5\' 6"  (1.676 m)    LUE: Incision is healed, palpable L radial and ulnar pulse   Medical Decision Making   Stacy Brennan is a 73 y.o. year old female who presents s/p left upper extremity thromboembolectomy which was performed emergently having presented to the ED with acute ischemia   Left arm incision well-healed  Patient is perfusing her left hand well with palpable radial and ulnar pulse  Unknown etiology for embolic event; continue Xarelto as long as tolerated  Follow-up on an as-needed basis  Dagoberto Ligas PA-C [facility] Office: [phone]  Clinic MD: Dr.  Oneida Alar

## 2018-10-03 ENCOUNTER — Encounter: Payer: Self-pay | Admitting: Adult Health

## 2018-10-03 ENCOUNTER — Ambulatory Visit (INDEPENDENT_AMBULATORY_CARE_PROVIDER_SITE_OTHER): Payer: Medicare Other | Admitting: Adult Health

## 2018-10-03 VITALS — BP 128/56 | Temp 97.8°F | Wt 159.0 lb

## 2018-10-03 DIAGNOSIS — F0391 Unspecified dementia with behavioral disturbance: Secondary | ICD-10-CM | POA: Diagnosis not present

## 2018-10-03 DIAGNOSIS — I708 Atherosclerosis of other arteries: Secondary | ICD-10-CM

## 2018-10-03 NOTE — Patient Instructions (Signed)
It is ok to increase Trazodone to 1 full tab. If that does not work after a couple of days, go ahead and increase to 1.5 tabs. Please let me know if this does not work

## 2018-10-03 NOTE — Progress Notes (Signed)
Subjective:    Patient ID: Stacy Brennan, female    DOB: 06/23/1946, 73 y.o.   MRN: [id number]  HPI 73 year old female who  has a past medical history of Allergic rhinitis, cause unspecified (01/01/2014), Anxiety state, unspecified (01/01/2014), Cataract, Cognitive impairment, Depression (01/01/2014), Glaucoma, Hypertension, Other and unspecified hyperlipidemia (01/01/2014), Renal insufficiency (01/01/2014), Stroke ([place]) (2002), Unspecified asthma(493.90) (01/01/2014), and Vision decreased.  She presents to the office today for one week follow up.  She was seen last week for concern of worsening cognitive impairment, wandering behavior, and sundowning syndrome. She was started on Trazodone 25 mg QHS and advised to follow up in one week to see how she was responding.   Today in the office her daughter reports that there has not been any change in her symptoms. She continues to have wandering behavior and sundowning syndrome. Her daughter reports that " it is like she is fighting through the medication   The daughter reports that they are going to start touring memory care facilities   Her daughter also reports no additional BRPR, but she would like for me to do an exam to make sure  Review of Systems See HPI   Past Medical History:  Diagnosis Date  . Allergic rhinitis, cause unspecified 01/01/2014  . Anxiety state, unspecified 01/01/2014  . Cataract   . Cognitive impairment   . Depression 01/01/2014  . Glaucoma   . Hypertension   . Other and unspecified hyperlipidemia 01/01/2014  . Renal insufficiency 01/01/2014  . Stroke [facility]) 2002   CVA x 2  . Unspecified asthma(493.90) 01/01/2014  . Vision decreased     Social History   Socioeconomic History  . Marital status: Single    Spouse name: Not on file  . Number of children: 4  . Years of education: 55  . Highest education level: Not on file  Occupational History  . Occupation: Financial risk analyst  . Financial resource strain: Not  on file  . Food insecurity:    Worry: Not on file    Inability: Not on file  . Transportation needs:    Medical: Not on file    Non-medical: Not on file  Tobacco Use  . Smoking status: Former Smoker    Last attempt to quit: 03/20/2002    Years since quitting: 16.5  . Smokeless tobacco: Current User    Types: Snuff  Substance and Sexual Activity  . Alcohol use: No  . Drug use: No  . Sexual activity: Not on file  Lifestyle  . Physical activity:    Days per week: Not on file    Minutes per session: Not on file  . Stress: Not on file  Relationships  . Social connections:    Talks on phone: Not on file    Gets together: Not on file    Attends religious service: Not on file    Active member of club or organization: Not on file    Attends meetings of clubs or organizations: Not on file    Relationship status: Not on file  . Intimate partner violence:    Fear of current or ex partner: Not on file    Emotionally abused: Not on file    Physically abused: Not on file    Forced sexual activity: Not on file  Other Topics Concern  . Not on file  Social History Narrative   Patient is single with 4 children.   Patient is right handed.   Patient  has a high school education.   Patient drinks 1 cup a week.    Past Surgical History:  Procedure Laterality Date  . DILATION AND CURETTAGE OF UTERUS    . THROMBECTOMY BRACHIAL ARTERY Left 08/30/2018   Procedure: LEFT SUBCLAVIAN ARTERY THROMBECTOMY;  Surgeon: Waynetta Sandy, MD;  Location: [facility];  Service: Vascular;  Laterality: Left;  . [place]      Family History  Problem Relation Age of Onset  . Cancer Other        prostate cancer  . Hypertension Other   . Diabetes Other   . Thyroid disease Other   . Hyperlipidemia Other   . Hypertension Other   . Glaucoma Other   . Parkinsonism Neg Hx   . Dementia Neg Hx     Allergies  Allergen Reactions  . Warfarin Palpitations  . Doxycycline Rash  . Aricept [Donepezil  Hcl] Other (See Comments)    Insomnia and not eating     Current Outpatient Medications on File Prior to Visit  Medication Sig Dispense Refill  . alendronate (FOSAMAX) 70 MG tablet Take 1 tablet (70 mg total) by mouth once a week. Take with a full glass of water on an empty stomach. 4 tablet 11  . ALPRAZolam (XANAX) 0.5 MG tablet Take 1 tablet (0.5 mg total) by mouth 2 (two) times daily. (Patient taking differently: Take 0.5 mg by mouth 3 (three) times daily as needed for anxiety (2-3 times daily). ) 60 tablet 0  . atorvastatin (LIPITOR) 20 MG tablet Take 1 tablet (20 mg total) by mouth daily. 90 tablet 3  . brimonidine (ALPHAGAN) 0.2 % ophthalmic solution Place 1 drop into both eyes 2 (two) times daily.     . dorzolamide-timolol (COSOPT) 22.3-6.8 MG/ML ophthalmic solution Place 1 drop into both eyes 2 (two) times daily.    Marland Kitchen labetalol (NORMODYNE) 200 MG tablet TAKE 1 TABLET BY MOUTH TWICE DAILY (Patient taking differently: Take 200 mg by mouth 2 (two) times daily. ) 60 tablet 2  . RHOPRESSA 0.02 % SOLN Place 1 drop into the left eye daily.     . rivaroxaban (XARELTO) 20 MG TABS tablet Take 1 tablet (20 mg total) by mouth daily with supper. 30 tablet 6  . spironolactone (ALDACTONE) 25 MG tablet TAKE 1 TABLET (25 MG TOTAL) BY MOUTH EVERY MORNING. (Patient taking differently: Take 25 mg by mouth daily. ) 30 tablet 3  . travoprost, benzalkonium, (TRAVATAN) 0.004 % ophthalmic solution Place 1 drop into both eyes at bedtime.     . traZODone (DESYREL) 50 MG tablet Take 0.5-1 tablets (25-50 mg total) by mouth at bedtime as needed for sleep. 30 tablet 3  . verapamil (CALAN-SR) 240 MG CR tablet TAKE 1 TABLET BY MOUTH AT BEDTIME (Patient taking differently: Take 240 mg by mouth daily. ) 30 tablet 9   No current facility-administered medications on file prior to visit.     BP (!) 128/56   Temp 97.8 F (36.6 C)   Wt 159 lb (72.1 kg)   BMI 25.66 kg/m       Objective:   Physical Exam Vitals signs  and nursing note reviewed.  Constitutional:      Appearance: Normal appearance.  Cardiovascular:     Rate and Rhythm: Normal rate and regular rhythm.     Pulses: Normal pulses.     Heart sounds: Normal heart sounds.  Pulmonary:     Effort: Pulmonary effort is normal.     Breath sounds: Normal  breath sounds.  Abdominal:     General: Abdomen is flat.     Palpations: Abdomen is soft.  Genitourinary:    Rectum: Guaiac result negative. External hemorrhoid present.  Skin:    General: Skin is warm and dry.     Capillary Refill: Capillary refill takes less than 2 seconds.  Neurological:     Mental Status: She is alert.  Psychiatric:        Mood and Affect: Mood normal.        Speech: Speech normal.        Behavior: Behavior normal.        Cognition and Memory: Cognition is impaired. Memory is impaired. She exhibits impaired recent memory and impaired remote memory.        Judgment: Judgment is not impulsive.       Assessment & Plan:  1. Dementia with behavioral disturbance, unspecified dementia type ([place]) - Ok to increase Trazodone to 50 mg. If no improvement in 3-5 days then increase to 75 mg. Follow up if no improvement  - List of local memory care units given   Dorothyann Peng, [facility]

## 2018-10-04 ENCOUNTER — Ambulatory Visit: Payer: Medicare Other

## 2018-10-11 ENCOUNTER — Encounter: Payer: Medicare Other | Admitting: Vascular Surgery

## 2018-10-16 ENCOUNTER — Telehealth: Payer: Self-pay

## 2018-10-16 DIAGNOSIS — F0391 Unspecified dementia with behavioral disturbance: Secondary | ICD-10-CM

## 2018-10-16 MED ORDER — TRAZODONE HCL 50 MG PO TABS: 75.0000 mg | ORAL_TABLET | Freq: Every evening | ORAL | 0 refills | Status: DC | PRN

## 2018-10-16 NOTE — Telephone Encounter (Signed)
Stacy Brennan, your last note said to take 1.5 tabs.  Please advise.

## 2018-10-16 NOTE — Telephone Encounter (Signed)
Copied from [place] [id number]. Topic: General - Other >> Oct 16, 2018  1:03 PM Antonieta Iba C wrote: Reason for CRM: pt's daughter [name] called in requesting a refill on pt's traZODone (DESYREL) 50 MG tablet, daughter says that she has spoken with provider and was advised that it is okay to give pt up to 2 1/2 daily. Pt's daughter [name] would like to know if PCP could changed directions on Rx and send in a refill to pharmacy   Pharmacy: [id number], [place] [phone] (Phone) [phone] (Fax)

## 2018-10-16 NOTE — Telephone Encounter (Signed)
Ok to take 1.5 tabs

## 2018-10-16 NOTE — Telephone Encounter (Signed)
Sent to the pharmacy by e-scribe. 

## 2018-11-06 ENCOUNTER — Other Ambulatory Visit: Payer: Self-pay | Admitting: Family Medicine

## 2018-11-06 DIAGNOSIS — Z760 Encounter for issue of repeat prescription: Secondary | ICD-10-CM

## 2018-11-06 NOTE — Telephone Encounter (Signed)
I can't tell if the PCP she was seeing in [place] was filling these. Can we check with the pharmacy and see who prescribed it last

## 2018-11-06 NOTE — Telephone Encounter (Signed)
Stacy Brennan, labetalol last filled on 02/22/2016 and Fosamax last filled on 01/21/2015.  Pt still taking these?

## 2018-11-07 ENCOUNTER — Ambulatory Visit: Payer: Self-pay

## 2018-11-07 NOTE — Telephone Encounter (Signed)
FYI

## 2018-11-07 NOTE — Telephone Encounter (Signed)
Tried multiple times to get through the the pharmacy.  Left a voicemail instructing the pharmacy to check with provider in [place], [place] as Tommi Rumps has not filled these in several years.  Advised a call back if needed.

## 2018-11-07 NOTE — Telephone Encounter (Signed)
  Pt daughter called to say that her mother is having  rectal bleeding.  She states that it turned the entire toilet red.  She gives a hx of her mother having bleeding in the past which it was explained as hemorrhoids. The patient has been getting progressively weak per the daughter. She is not eating much.  She does have Hx of constipation and takes several medications. She takes Xarelto. Per protocol office was consulted. Per Dorothyann Peng pt will go to ER for evaluation of bleeding. Care advice read to daughter to include wear a mask. Kim verbalized understanding of the instructions. Reason for Disposition . Taking Coumadin (warfarin) or other strong blood thinner, or known bleeding disorder (e.g., thrombocytopenia)  Answer Assessment - Initial Assessment Questions 1. APPEARANCE of BLOOD: "What color is it?" "Is it passed separately, on the surface of the stool, or mixed in with the stool?"      Water red 2. AMOUNT: "How much blood was passed?"      A lot 3. FREQUENCY: "How many times has blood been passed with the stools?"      unknown 4. ONSET: "When was the blood first seen in the stools?" (Days or weeks)       5. DIARRHEA: "Is there also some diarrhea?" If so, ask: "How many diarrhea stools were passed in past 24 hours?"      no 6. CONSTIPATION: "Do you have constipation?" If so, "How bad is it?"     yes 7. RECURRENT SYMPTOMS: "Have you had blood in your stools before?" If so, ask: "When was the last time?" and "What happened that time?"     Yes off and on since march 5 visit 8. BLOOD THINNERS: "Do you take any blood thinners?" (e.g., Coumadin/warfarin, Pradaxa/dabigatran, aspirin)     xarelto 9. OTHER SYMPTOMS: "Do you have any other symptoms?"  (e.g., abdominal pain, vomiting, dizziness, fever)    Weak, no abdominal pain 10. PREGNANCY: "Is there any chance you are pregnant?" "When was your last menstrual period?"       N/A  Protocols used: RECTAL BLEEDING-A-AH

## 2018-11-19 ENCOUNTER — Inpatient Hospital Stay (HOSPITAL_COMMUNITY)
Admission: EM | Admit: 2018-11-19 | Discharge: 2018-11-21 | DRG: 377 | Disposition: A | Discharge status: Home / Self Care | Payer: Medicare Other | Attending: Internal Medicine | Admitting: Internal Medicine

## 2018-11-19 ENCOUNTER — Encounter (HOSPITAL_COMMUNITY): Payer: Self-pay | Admitting: Emergency Medicine

## 2018-11-19 ENCOUNTER — Other Ambulatory Visit: Payer: Self-pay

## 2018-11-19 DIAGNOSIS — Z8249 Family history of ischemic heart disease and other diseases of the circulatory system: Secondary | ICD-10-CM

## 2018-11-19 DIAGNOSIS — Z7983 Long term (current) use of bisphosphonates: Secondary | ICD-10-CM

## 2018-11-19 DIAGNOSIS — Z79899 Other long term (current) drug therapy: Secondary | ICD-10-CM

## 2018-11-19 DIAGNOSIS — F329 Major depressive disorder, single episode, unspecified: Secondary | ICD-10-CM | POA: Diagnosis present

## 2018-11-19 DIAGNOSIS — F411 Generalized anxiety disorder: Secondary | ICD-10-CM | POA: Diagnosis present

## 2018-11-19 DIAGNOSIS — D62 Acute posthemorrhagic anemia: Secondary | ICD-10-CM | POA: Diagnosis not present

## 2018-11-19 DIAGNOSIS — D500 Iron deficiency anemia secondary to blood loss (chronic): Secondary | ICD-10-CM | POA: Diagnosis not present

## 2018-11-19 DIAGNOSIS — I129 Hypertensive chronic kidney disease with stage 1 through stage 4 chronic kidney disease, or unspecified chronic kidney disease: Secondary | ICD-10-CM | POA: Diagnosis not present

## 2018-11-19 DIAGNOSIS — N183 Chronic kidney disease, stage 3 unspecified: Secondary | ICD-10-CM

## 2018-11-19 DIAGNOSIS — K922 Gastrointestinal hemorrhage, unspecified: Principal | ICD-10-CM | POA: Diagnosis present

## 2018-11-19 DIAGNOSIS — I10 Essential (primary) hypertension: Secondary | ICD-10-CM | POA: Diagnosis present

## 2018-11-19 DIAGNOSIS — K269 Duodenal ulcer, unspecified as acute or chronic, without hemorrhage or perforation: Secondary | ICD-10-CM

## 2018-11-19 DIAGNOSIS — F0390 Unspecified dementia without behavioral disturbance: Secondary | ICD-10-CM | POA: Diagnosis not present

## 2018-11-19 DIAGNOSIS — H409 Unspecified glaucoma: Secondary | ICD-10-CM | POA: Diagnosis present

## 2018-11-19 DIAGNOSIS — K222 Esophageal obstruction: Secondary | ICD-10-CM

## 2018-11-19 DIAGNOSIS — Z86718 Personal history of other venous thrombosis and embolism: Secondary | ICD-10-CM

## 2018-11-19 DIAGNOSIS — M810 Age-related osteoporosis without current pathological fracture: Secondary | ICD-10-CM | POA: Diagnosis present

## 2018-11-19 DIAGNOSIS — J329 Chronic sinusitis, unspecified: Secondary | ICD-10-CM | POA: Diagnosis present

## 2018-11-19 DIAGNOSIS — K259 Gastric ulcer, unspecified as acute or chronic, without hemorrhage or perforation: Secondary | ICD-10-CM

## 2018-11-19 DIAGNOSIS — J45909 Unspecified asthma, uncomplicated: Secondary | ICD-10-CM | POA: Diagnosis present

## 2018-11-19 DIAGNOSIS — K209 Esophagitis, unspecified: Secondary | ICD-10-CM | POA: Diagnosis present

## 2018-11-19 DIAGNOSIS — E785 Hyperlipidemia, unspecified: Secondary | ICD-10-CM | POA: Diagnosis present

## 2018-11-19 DIAGNOSIS — N170 Acute kidney failure with tubular necrosis: Secondary | ICD-10-CM | POA: Diagnosis present

## 2018-11-19 DIAGNOSIS — R000 Tachycardia, unspecified: Secondary | ICD-10-CM | POA: Diagnosis not present

## 2018-11-19 DIAGNOSIS — Z888 Allergy status to other drugs, medicaments and biological substances status: Secondary | ICD-10-CM

## 2018-11-19 DIAGNOSIS — K449 Diaphragmatic hernia without obstruction or gangrene: Secondary | ICD-10-CM | POA: Diagnosis present

## 2018-11-19 DIAGNOSIS — R58 Hemorrhage, not elsewhere classified: Secondary | ICD-10-CM | POA: Diagnosis not present

## 2018-11-19 DIAGNOSIS — F17220 Nicotine dependence, chewing tobacco, uncomplicated: Secondary | ICD-10-CM | POA: Diagnosis present

## 2018-11-19 DIAGNOSIS — R404 Transient alteration of awareness: Secondary | ICD-10-CM | POA: Diagnosis not present

## 2018-11-19 DIAGNOSIS — R55 Syncope and collapse: Secondary | ICD-10-CM

## 2018-11-19 DIAGNOSIS — I959 Hypotension, unspecified: Secondary | ICD-10-CM | POA: Diagnosis present

## 2018-11-19 DIAGNOSIS — K921 Melena: Secondary | ICD-10-CM | POA: Diagnosis not present

## 2018-11-19 DIAGNOSIS — Z7901 Long term (current) use of anticoagulants: Secondary | ICD-10-CM

## 2018-11-19 DIAGNOSIS — Z8673 Personal history of transient ischemic attack (TIA), and cerebral infarction without residual deficits: Secondary | ICD-10-CM

## 2018-11-19 DIAGNOSIS — N179 Acute kidney failure, unspecified: Secondary | ICD-10-CM

## 2018-11-19 DIAGNOSIS — R410 Disorientation, unspecified: Secondary | ICD-10-CM | POA: Diagnosis not present

## 2018-11-19 LAB — COMPREHENSIVE METABOLIC PANEL
ALT: 11 U/L (ref 0–44)
AST: 16 U/L (ref 15–41)
Albumin: 3.8 g/dL (ref 3.5–5.0)
Alkaline Phosphatase: 52 U/L (ref 38–126)
Anion gap: 9 (ref 5–15)
BUN: 30 mg/dL — ABNORMAL HIGH (ref 8–23)
CO2: 20 mmol/L — ABNORMAL LOW (ref 22–32)
Calcium: 9 mg/dL (ref 8.9–10.3)
Chloride: 108 mmol/L (ref 98–111)
Creatinine, Ser: 1.85 mg/dL — ABNORMAL HIGH (ref 0.44–1.00)
GFR calc Af Amer: 31 mL/min — ABNORMAL LOW (ref >60)
GFR calc non Af Amer: 27 mL/min — ABNORMAL LOW (ref >60)
Glucose, Bld: 100 mg/dL — ABNORMAL HIGH (ref 70–99)
Potassium: 3.9 mmol/L (ref 3.5–5.1)
Sodium: 137 mmol/L (ref 135–145)
Total Bilirubin: 0.3 mg/dL (ref 0.3–1.2)
Total Protein: 6.4 g/dL — ABNORMAL LOW (ref 6.5–8.1)

## 2018-11-19 LAB — CBC WITH DIFFERENTIAL/PLATELET
Abs Immature Granulocytes: 0.03 10*3/uL (ref 0.00–0.07)
Basophils Absolute: 0 10*3/uL (ref 0.0–0.1)
Basophils Relative: 0 %
Eosinophils Absolute: 0.2 10*3/uL (ref 0.0–0.5)
Eosinophils Relative: 2 %
HCT: 19.8 % — ABNORMAL LOW (ref 36.0–46.0)
Hemoglobin: 5.7 g/dL — CL (ref 12.0–15.0)
Immature Granulocytes: 0 %
Lymphocytes Relative: 18 %
Lymphs Abs: 1.6 10*3/uL (ref 0.7–4.0)
MCH: 24.3 pg — ABNORMAL LOW (ref 26.0–34.0)
MCHC: 28.8 g/dL — ABNORMAL LOW (ref 30.0–36.0)
MCV: 84.3 fL (ref 80.0–100.0)
Monocytes Absolute: 1 10*3/uL (ref 0.1–1.0)
Monocytes Relative: 12 %
Neutro Abs: 5.8 10*3/uL (ref 1.7–7.7)
Neutrophils Relative %: 68 %
Platelets: 233 10*3/uL (ref 150–400)
RBC: 2.35 MIL/uL — ABNORMAL LOW (ref 3.87–5.11)
RDW: 15.7 % — ABNORMAL HIGH (ref 11.5–15.5)
WBC: 8.6 10*3/uL (ref 4.0–10.5)
nRBC: 0 % (ref 0.0–0.2)

## 2018-11-19 LAB — HEMOGLOBIN AND HEMATOCRIT, BLOOD
HCT: 29.2 % — ABNORMAL LOW (ref 36.0–46.0)
Hemoglobin: 9.1 g/dL — ABNORMAL LOW (ref 12.0–15.0)

## 2018-11-19 LAB — PREPARE RBC (CROSSMATCH)

## 2018-11-19 LAB — ABO/RH: ABO/RH(D): B POS

## 2018-11-19 LAB — POC OCCULT BLOOD, ED: Fecal Occult Bld: POSITIVE — AB

## 2018-11-19 MED ORDER — SODIUM CHLORIDE 0.9% IV SOLUTION
Freq: Once | INTRAVENOUS | Status: AC
  Administered 2018-11-19: 14:00:00 via INTRAVENOUS

## 2018-11-19 MED ORDER — LATANOPROST 0.005 % OP SOLN
1.0000 [drp] | Freq: Every day | OPHTHALMIC | Status: DC
  Administered 2018-11-19 – 2018-11-20 (×2): 1 [drp] via OPHTHALMIC
  Filled 2018-11-19: qty 2.5

## 2018-11-19 MED ORDER — ACETAMINOPHEN 650 MG RE SUPP: 650.0000 mg | Freq: Four times a day (QID) | RECTAL | Status: DC | PRN

## 2018-11-19 MED ORDER — SODIUM CHLORIDE 0.9 % IV BOLUS
1000.0000 mL | Freq: Once | INTRAVENOUS | Status: AC
  Administered 2018-11-19: 1000 mL via INTRAVENOUS

## 2018-11-19 MED ORDER — PANTOPRAZOLE SODIUM 40 MG IV SOLR: 40.0000 mg | Freq: Two times a day (BID) | INTRAVENOUS | Status: DC

## 2018-11-19 MED ORDER — SODIUM CHLORIDE 0.9 % IV SOLN
8.0000 mg/h | INTRAVENOUS | Status: DC
  Administered 2018-11-19 – 2018-11-20 (×3): 8 mg/h via INTRAVENOUS
  Filled 2018-11-19 (×4): qty 80

## 2018-11-19 MED ORDER — FLUTICASONE PROPIONATE 50 MCG/ACT NA SUSP
1.0000 | Freq: Every day | NASAL | Status: DC | PRN
  Filled 2018-11-19: qty 16

## 2018-11-19 MED ORDER — SODIUM CHLORIDE 0.9 % IV SOLN
INTRAVENOUS | Status: AC
  Administered 2018-11-19: 05:00:00 via INTRAVENOUS

## 2018-11-19 MED ORDER — DORZOLAMIDE HCL-TIMOLOL MAL 2-0.5 % OP SOLN
1.0000 [drp] | Freq: Two times a day (BID) | OPHTHALMIC | Status: DC
  Administered 2018-11-19 – 2018-11-21 (×4): 1 [drp] via OPHTHALMIC
  Filled 2018-11-19: qty 10

## 2018-11-19 MED ORDER — ALPRAZOLAM 0.5 MG PO TABS
0.5000 mg | ORAL_TABLET | Freq: Three times a day (TID) | ORAL | Status: DC | PRN
  Administered 2018-11-19 – 2018-11-20 (×3): 0.5 mg via ORAL
  Filled 2018-11-19 (×3): qty 1

## 2018-11-19 MED ORDER — NETARSUDIL DIMESYLATE 0.02 % OP SOLN: 1.0000 [drp] | Freq: Every day | OPHTHALMIC | Status: DC

## 2018-11-19 MED ORDER — VERAPAMIL HCL ER 240 MG PO TBCR: 240.0000 mg | EXTENDED_RELEASE_TABLET | Freq: Every day | ORAL | Status: DC

## 2018-11-19 MED ORDER — ACETAMINOPHEN 325 MG PO TABS: 650.0000 mg | ORAL_TABLET | Freq: Four times a day (QID) | ORAL | Status: DC | PRN

## 2018-11-19 MED ORDER — SODIUM CHLORIDE 0.9 % IV SOLN
10.0000 mL/h | Freq: Once | INTRAVENOUS | Status: AC
  Administered 2018-11-19: 04:00:00 10 mL/h via INTRAVENOUS

## 2018-11-19 MED ORDER — SODIUM CHLORIDE 0.9 % IV SOLN
80.0000 mg | Freq: Once | INTRAVENOUS | Status: AC
  Administered 2018-11-19: 04:00:00 80 mg via INTRAVENOUS
  Filled 2018-11-19: qty 80

## 2018-11-19 MED ORDER — LABETALOL HCL 200 MG PO TABS
200.0000 mg | ORAL_TABLET | Freq: Two times a day (BID) | ORAL | Status: DC
  Administered 2018-11-20 – 2018-11-21 (×3): 200 mg via ORAL
  Filled 2018-11-19 (×4): qty 1

## 2018-11-19 MED ORDER — TRAZODONE HCL 50 MG PO TABS
75.0000 mg | ORAL_TABLET | Freq: Every evening | ORAL | Status: DC | PRN
  Administered 2018-11-19 – 2018-11-20 (×2): 75 mg via ORAL
  Filled 2018-11-19 (×2): qty 2

## 2018-11-19 MED ORDER — ATORVASTATIN CALCIUM 10 MG PO TABS
20.0000 mg | ORAL_TABLET | Freq: Every day | ORAL | Status: DC
  Administered 2018-11-19 – 2018-11-21 (×3): 20 mg via ORAL
  Filled 2018-11-19 (×3): qty 2

## 2018-11-19 MED ORDER — BRIMONIDINE TARTRATE 0.2 % OP SOLN
1.0000 [drp] | Freq: Two times a day (BID) | OPHTHALMIC | Status: DC
  Administered 2018-11-19 – 2018-11-21 (×4): 1 [drp] via OPHTHALMIC
  Filled 2018-11-19: qty 5

## 2018-11-19 NOTE — Progress Notes (Signed)
Agree with assessment prior.

## 2018-11-19 NOTE — H&P (View-Only) (Signed)
Consultation  Referring Provider: triad hospitalist/[place] DO Primary Care Physician:  Dorothyann Peng, NP Primary Gastroenterologist:   none  Reason for Consultation:  Melena, syncope, anemia  HPI: Stacy Brennan is a 73 y.o. female who we are asked to evaluate for profound anemia and history of melena.  Patient has no prior GI history.  She was admitted early this morning through the emergency room after brought in by family after an episode of syncope/presyncope.  EMS found about 200 cc of tarry stool that patient had passed at home. Patient has been on Xarelto since February 2020 after she developed a left upper extremity thrombosis and had undergone embolectomy.  She has history of hypertension chronic kidney disease stage III, prior history of CVA and history of dementia. He has been hemodynamically stable since arrival not passed any further stool. Work-up in the ER with finding of hemoglobin of 5.7 hematocrit 19.8 MCV of 84.5.  She is to receive 2 units of packed RBCs has been started on Protonix infusion.  Baseline hemoglobin 11.8 in February 2020  She denies any recent abdominal discomfort indigestion heartburn nausea vomiting.  She says her appetite has been fine and her bowel movements have been normal for her She says she had not noticed any blood or black stools over the past week or so. She does take Advil sinus on a regular basis usually 2 every morning and 2 every evening for chronic sinusitis issues. She tells me that she took her Xarelto yesterday evening.   Past Medical History:  Diagnosis Date  . Allergic rhinitis, cause unspecified 01/01/2014  . Anxiety state, unspecified 01/01/2014  . Cataract   . Cognitive impairment   . Depression 01/01/2014  . Glaucoma   . Hypertension   . Other and unspecified hyperlipidemia 01/01/2014  . Renal insufficiency 01/01/2014  . Stroke [facility]) 2002   CVA x 2  . Unspecified asthma(493.90) 01/01/2014  . Vision decreased     Past  Surgical History:  Procedure Laterality Date  . DILATION AND CURETTAGE OF UTERUS    . THROMBECTOMY BRACHIAL ARTERY Left 08/30/2018   Procedure: LEFT SUBCLAVIAN ARTERY THROMBECTOMY;  Surgeon: Waynetta Sandy, MD;  Location: [facility];  Service: Vascular;  Laterality: Left;  . TUBALIZATION      Prior to Admission medications   Medication Sig Start Date End Date Taking? Authorizing Provider  acetaminophen (TYLENOL) 500 MG tablet Take 1,000 mg by mouth every 6 (six) hours as needed for moderate pain.   Yes [provider]  alendronate (FOSAMAX) 70 MG tablet Take 1 tablet (70 mg total) by mouth once a week. Take with a full glass of water on an empty stomach. Patient taking differently: Take 70 mg by mouth every Monday. Take with a full glass of water on an empty stomach. 01/21/15  Yes Nafziger, Tommi Rumps, NP  ALPRAZolam (XANAX) 0.5 MG tablet Take 1 tablet (0.5 mg total) by mouth 2 (two) times daily. Patient taking differently: Take 0.5 mg by mouth 3 (three) times daily as needed for anxiety.  09/03/15  Yes Nafziger, Tommi Rumps, NP  atorvastatin (LIPITOR) 20 MG tablet Take 1 tablet (20 mg total) by mouth daily. 06/11/18  Yes Nafziger, Tommi Rumps, NP  brimonidine (ALPHAGAN) 0.2 % ophthalmic solution Place 1 drop into both eyes 2 (two) times daily.  04/17/18  Yes [provider]  chlorhexidine (PERIDEX) 0.12 % solution Use as directed 15 mLs in the mouth or throat daily as needed. Gum Diease   Yes [provider]  diphenhydrAMINE (BENADRYL) 25 MG tablet Take 50 mg by mouth every 6 (six) hours as needed for allergies.   Yes [provider]  dorzolamide-timolol (COSOPT) 22.3-6.8 MG/ML ophthalmic solution Place 1 drop into both eyes 2 (two) times daily.   Yes [provider]  fluticasone (FLONASE) 50 MCG/ACT nasal spray Place 1 spray into both nostrils daily as needed for allergies or rhinitis.   Yes [provider]  ibuprofen (ADVIL) 200 MG tablet Take 600 mg by  mouth every 8 (eight) hours as needed for moderate pain.   Yes [provider]  labetalol (NORMODYNE) 200 MG tablet TAKE 1 TABLET BY MOUTH TWICE DAILY Patient taking differently: Take 200 mg by mouth 2 (two) times daily.  02/22/16  Yes Nafziger, Tommi Rumps, NP  methylcellulose oral powder Take 1 packet by mouth daily.   Yes [provider]  Phenylephrine-Acetaminophen (SINUS AND HEADACHE) 5-325 MG TABS Take 2 tablets by mouth every 6 (six) hours as needed. Congestion and headache   Yes [provider]  RHOPRESSA 0.02 % SOLN Place 1 drop into both eyes daily.  05/15/18  Yes [provider]  rivaroxaban (XARELTO) 20 MG TABS tablet Take 1 tablet (20 mg total) by mouth daily with supper. 09/20/18  Yes Waynetta Sandy, MD  sennosides-docusate sodium (SENOKOT-S) 8.6-50 MG tablet Take 1 tablet by mouth daily.   Yes [provider]  spironolactone (ALDACTONE) 25 MG tablet TAKE 1 TABLET (25 MG TOTAL) BY MOUTH EVERY MORNING. Patient taking differently: Take 25 mg by mouth daily.  04/28/15  Yes Nafziger, Tommi Rumps, NP  Travoprost, BAK Free, (TRAVATAN) 0.004 % SOLN ophthalmic solution Place 1 drop into both eyes at bedtime.   Yes [provider]  traZODone (DESYREL) 50 MG tablet Take 1.5 tablets (75 mg total) by mouth at bedtime as needed for sleep. 10/16/18  Yes Nafziger, Tommi Rumps, NP  verapamil (CALAN-SR) 240 MG CR tablet TAKE 1 TABLET BY MOUTH AT BEDTIME Patient taking differently: Take 240 mg by mouth daily.  02/21/16  Yes Biagio Borg, MD    Current Facility-Administered Medications  Medication Dose Route Frequency Provider Last Rate Last Dose  . 0.9 %  sodium chloride infusion   Intravenous Continuous Shela Leff, MD 125 mL/hr at 11/19/18 5102    . acetaminophen (TYLENOL) tablet 650 mg  650 mg Oral Q6H PRN Shela Leff, MD       Or  . acetaminophen (TYLENOL) suppository 650 mg  650 mg Rectal Q6H PRN Shela Leff, MD      . pantoprazole  (PROTONIX) 80 mg in sodium chloride 0.9 % 250 mL (0.32 mg/mL) infusion  8 mg/hr Intravenous Continuous Shela Leff, MD 25 mL/hr at 11/19/18 0418 8 mg/hr at 11/19/18 0418  . [START ON 11/22/2018] pantoprazole (PROTONIX) injection 40 mg  40 mg Intravenous Q12H Shela Leff, MD        Allergies as of 11/19/2018 - Review Complete 11/19/2018  Allergen Reaction Noted  . Warfarin Palpitations 07/11/2015  . Doxycycline Rash 06/30/2016  . Aricept [donepezil hcl] Other (See Comments) 09/19/2016    Family History  Problem Relation Age of Onset  . Cancer Other        prostate cancer  . Hypertension Other   . Diabetes Other   . Thyroid disease Other   . Hyperlipidemia Other   . Hypertension Other   . Glaucoma Other   . Parkinsonism Neg Hx   . Dementia Neg Hx     Social History  Socioeconomic History  . Marital status: Single    Spouse name: Not on file  . Number of children: 4  . Years of education: 47  . Highest education level: Not on file  Occupational History  . Occupation: Financial risk analyst  . Financial resource strain: Not on file  . Food insecurity:    Worry: Not on file    Inability: Not on file  . Transportation needs:    Medical: Not on file    Non-medical: Not on file  Tobacco Use  . Smoking status: Former Smoker    Last attempt to quit: 03/20/2002    Years since quitting: 16.6  . Smokeless tobacco: Current User    Types: Snuff  Substance and Sexual Activity  . Alcohol use: No  . Drug use: No  . Sexual activity: Not on file  Lifestyle  . Physical activity:    Days per week: Not on file    Minutes per session: Not on file  . Stress: Not on file  Relationships  . Social connections:    Talks on phone: Not on file    Gets together: Not on file    Attends religious service: Not on file    Active member of club or organization: Not on file    Attends meetings of clubs or organizations: Not on file    Relationship status: Not on file   . Intimate partner violence:    Fear of current or ex partner: Not on file    Emotionally abused: Not on file    Physically abused: Not on file    Forced sexual activity: Not on file  Other Topics Concern  . Not on file  Social History Narrative   Patient is single with 4 children.   Patient is right handed.   Patient has a high school education.   Patient drinks 1 cup a week.    Review of Systems: Pertinent positive and negative review of systems were noted in the above HPI section.  All other review of systems was otherwise negative.Marland Kitchen  Physical Exam: Vital signs in last 24 hours: Temp:  [98 F (36.7 C)-98.7 F (37.1 C)] 98.7 F (37.1 C) (04/21 0634) Pulse Rate:  [73-86] 75 (04/21 0634) Resp:  [12-23] 20 (04/21 0634) BP: (68-152)/(45-65) 130/62 (04/21 0634) SpO2:  [100 %] 100 % (04/21 0634) Weight:  [68 kg] 68 kg (04/21 0137)   General:   Alert,  Well-developed, well-nourished, elderly African-American female, sitting up in chair pleasant and cooperative in NAD-confused to time  head:  Normocephalic and atraumatic. Eyes:  Sclera clear, no icterus.   Conjunctiva pale Ears:  Normal auditory acuity. Nose:  No deformity, discharge,  or lesions. Mouth:  No deformity or lesions.   Neck:  Supple; no masses or thyromegaly. Lungs:  Clear throughout to auscultation.   No wheezes, crackles, or rhonchi.  Heart:  Regular rate and rhythm; no murmurs, clicks, rubs,  or gallops. Abdomen:  Soft,nontender, BS active,nonpalp mass or hsm.   Rectal:  Deferred  Msk:  Symmetrical without gross deformities. . Pulses:  Normal pulses noted. Extremities:  Without clubbing or edema. Neurologic:  Alert and  oriented x2  grossly normal neurologically. Skin:  Intact without significant lesions or rashes.. Psych:  Alert and cooperative. Normal mood and affect.  Intake/Output from previous day: 04/20 0701 - 04/21 0700 In: 1731 [I.V.:350; Blood:311; IV Piggyback:1070] Out: 1 [Stool:1]  Intake/Output this shift: No intake/output data recorded.  Lab Results: Recent Labs  11/19/18 0144  WBC 8.6  HGB 5.7*  HCT 19.8*  PLT 233   BMET Recent Labs    11/19/18 0144  NA 137  K 3.9  CL 108  CO2 20*  GLUCOSE 100*  BUN 30*  CREATININE 1.85*  CALCIUM 9.0   LFT Recent Labs    11/19/18 0144  PROT 6.4*  ALBUMIN 3.8  AST 16  ALT 11  ALKPHOS 52  BILITOT 0.3   PT/INR No results for input(s): LABPROT, INR in the last 72 hours.   IMPRESSION:  #44 74 year old African-American female dated earlier this morning after syncopal episode at home and passage of large tarry stool Hemoglobin 5.7 on arrival And has been on Xarelto over the past 33months and also takes Advil regularly. 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 after midnight  2 units of packed RBCs have been ordered, will need serial hemoglobins and transfusions to keep hemoglobin between 7 and 8  Protonix infusion has been started  As patient is not actively hemorrhaging this morning we will plan for EGD at 8 AM tomorrow morning with Dr. Bryan Lemma which will allow time for Xarelto to washout. If patient has evidence of active hemorrhage or hemodynamic instability today EGD can be done emergently but therapeutic options are more limited  Thank you will follow with you     Marlene Beidler PA-C 11/19/2018, 9:00 AM

## 2018-11-19 NOTE — ED Notes (Addendum)
Called pt daughter Rosine Beat. [name] said she has healthcare power of attorney. Kim gives verbal consent to give the patient blood and blood products.   [name] said, she took patient to Dr. Blenda Nicely on 09/25/18 and 10/03/18 due to bright red rectal bleeding. Dr. Marcha Solders said she has hemorrhoids.

## 2018-11-19 NOTE — Anesthesia Preprocedure Evaluation (Addendum)
Anesthesia Evaluation  Patient identified by MRN, date of birth, ID band Patient awake    Reviewed: Allergy & Precautions, NPO status , Patient's Chart, lab work & pertinent test results, reviewed documented beta blocker date and time   History of Anesthesia Complications Negative for: history of anesthetic complications  Airway Mallampati: II  TM Distance: >3 FB Neck ROM: Full    Dental  (+) Dental Advisory Given   Pulmonary asthma , former smoker,    breath sounds clear to auscultation       Cardiovascular hypertension, Pt. on medications and Pt. on home beta blockers  Rhythm:Regular Rate:Normal     Neuro/Psych PSYCHIATRIC DISORDERS Anxiety Depression Dementia CVA    GI/Hepatic negative GI ROS, Neg liver ROS,   Endo/Other  negative endocrine ROS  Renal/GU CRFRenal disease     Musculoskeletal negative musculoskeletal ROS (+)   Abdominal   Peds  Hematology  (+) anemia ,   Anesthesia Other Findings   Reproductive/Obstetrics                            Anesthesia Physical Anesthesia Plan  ASA: III  Anesthesia Plan: General   Post-op Pain Management:    Induction: Intravenous and Rapid sequence  PONV Risk Score and Plan: 3 and Treatment may vary due to age or medical condition and Ondansetron  Airway Management Planned: Oral ETT  Additional Equipment: None  Intra-op Plan:   Post-operative Plan: Extubation in OR  Informed Consent: I have reviewed the patients History and Physical, chart, labs and discussed the procedure including the risks, benefits and alternatives for the proposed anesthesia with the patient or authorized representative who has indicated his/her understanding and acceptance.     Dental advisory given  Plan Discussed with: CRNA and Anesthesiologist  Anesthesia Plan Comments:        Anesthesia Quick Evaluation

## 2018-11-19 NOTE — ED Notes (Signed)
Blood consent in chart. Pt verbalized understanding and need for blood. She verbalized acceptance of the blood transfusion.

## 2018-11-19 NOTE — ED Triage Notes (Addendum)
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 Syncople when sitting more than 45 degrees.  --Vitals BP 112/55 HR 72 NSR O2 98% RR 20 Temp 97.2  EMS gave 50 cc NS  20 right wrist  Family contacts Daughter [name] [phone] and Kimberly's husband Anson Fret [phone]

## 2018-11-19 NOTE — ED Notes (Signed)
Pt had incontinent episode of stool upon arrival from EMS. Pt cleaned and given new brief, gown and sheets.

## 2018-11-19 NOTE — ED Provider Notes (Signed)
[facility] DEPT Provider Note   CSN: [id number] Arrival date & time: 11/19/18  0127    History   Chief Complaint Chief Complaint  Patient presents with  . GI Bleeding  . Loss of Consciousness    HPI Stacy Brennan is a 73 y.o. female.     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  Per EMS, patient was walking to the bathroom when she passed out.  Patient denies passing out but states "I just fell."  She cannot elaborate and cannot tell me whether she was dizzy or not.  She was noted to have approximately 200 cc of dark tarry stool and blood.  Per EMS, history of GI bleed on Xarelto; however, patient denies this.  She is oriented to self and place but not time.  She does have some history of dementia.  Chart review does not reveal any recent colonoscopies or endoscopies.  She is currently on Xarelto.  Patient is only complaining of being cold.  She denies any nausea, vomiting, abdominal pain.  Level 5 caveat for dementia  Past Medical History:  Diagnosis Date  . Allergic rhinitis, cause unspecified 01/01/2014  . Anxiety state, unspecified 01/01/2014  . Cataract   . Cognitive impairment   . Depression 01/01/2014  . Glaucoma   . Hypertension   . Other and unspecified hyperlipidemia 01/01/2014  . Renal insufficiency 01/01/2014  . Stroke [facility]) 2002   CVA x 2  . Unspecified asthma(493.90) 01/01/2014  . Vision decreased     Patient Active Problem List   Diagnosis Date Noted  . Left subclavian artery occlusion 08/30/2018  . Osteoporosis 01/21/2015  . Encounter to establish care 12/21/2014  . Sinusitis, chronic 12/21/2014  . Fatigue 09/16/2014  . Cough 09/16/2014  . Benzodiazepine dependence ([place]) 05/02/2014  . Cataract 03/22/2014  . Cognitive impairment 03/22/2014  . Snoring 03/22/2014  . Excessive daytime  sleepiness 03/22/2014  . Insomnia 03/22/2014  . Anxiety state 01/01/2014  . Hyperlipidemia 01/01/2014  . Renal insufficiency 01/01/2014  . Preventative health care 01/01/2014  . Allergic rhinitis, cause unspecified 01/01/2014  . Asthma 01/01/2014  . Depression 01/01/2014  . Stroke ([place])   . Hypertension   . Glaucoma     Past Surgical History:  Procedure Laterality Date  . DILATION AND CURETTAGE OF UTERUS    . THROMBECTOMY BRACHIAL ARTERY Left 08/30/2018   Procedure: LEFT SUBCLAVIAN ARTERY THROMBECTOMY;  Surgeon: Waynetta Sandy, MD;  Location: [place];  Service: Vascular;  Laterality: Left;  . TUBALIZATION       OB History   No obstetric history on file.      Home Medications    Prior to Admission medications   Medication Sig Start Date End Date Taking? Authorizing Provider  acetaminophen (TYLENOL) 500 MG tablet Take 1,000 mg by mouth every 6 (six) hours as needed for moderate pain.   Yes [provider]  alendronate (FOSAMAX) 70 MG tablet Take 1 tablet (70 mg total) by mouth once a week. Take with a full glass of water on an empty stomach. Patient taking differently: Take 70 mg by mouth every Monday. Take with a full glass of water on an empty stomach. 01/21/15  Yes Nafziger, Tommi Rumps, NP  ALPRAZolam (XANAX) 0.5 MG tablet Take 1 tablet (0.5 mg total) by mouth 2 (two) times daily. Patient taking differently: Take 0.5  mg by mouth 3 (three) times daily as needed for anxiety.  09/03/15  Yes Nafziger, Tommi Rumps, NP  atorvastatin (LIPITOR) 20 MG tablet Take 1 tablet (20 mg total) by mouth daily. 06/11/18  Yes Nafziger, Tommi Rumps, NP  brimonidine (ALPHAGAN) 0.2 % ophthalmic solution Place 1 drop into both eyes 2 (two) times daily.  04/17/18  Yes [provider]  chlorhexidine (PERIDEX) 0.12 % solution Use as directed 15 mLs in the mouth or throat daily as needed. Gum Diease   Yes [provider]  diphenhydrAMINE (BENADRYL) 25 MG tablet Take 50 mg by mouth every 6  (six) hours as needed for allergies.   Yes [provider]  dorzolamide-timolol (COSOPT) 22.3-6.8 MG/ML ophthalmic solution Place 1 drop into both eyes 2 (two) times daily.   Yes [provider]  fluticasone (FLONASE) 50 MCG/ACT nasal spray Place 1 spray into both nostrils daily as needed for allergies or rhinitis.   Yes [provider]  ibuprofen (ADVIL) 200 MG tablet Take 600 mg by mouth every 8 (eight) hours as needed for moderate pain.   Yes [provider]  labetalol (NORMODYNE) 200 MG tablet TAKE 1 TABLET BY MOUTH TWICE DAILY Patient taking differently: Take 200 mg by mouth 2 (two) times daily.  02/22/16  Yes Nafziger, Tommi Rumps, NP  methylcellulose oral powder Take 1 packet by mouth daily.   Yes [provider]  Phenylephrine-Acetaminophen (SINUS AND HEADACHE) 5-325 MG TABS Take 2 tablets by mouth every 6 (six) hours as needed. Congestion and headache   Yes [provider]  RHOPRESSA 0.02 % SOLN Place 1 drop into both eyes daily.  05/15/18  Yes [provider]  rivaroxaban (XARELTO) 20 MG TABS tablet Take 1 tablet (20 mg total) by mouth daily with supper. 09/20/18  Yes Waynetta Sandy, MD  sennosides-docusate sodium (SENOKOT-S) 8.6-50 MG tablet Take 1 tablet by mouth daily.   Yes [provider]  spironolactone (ALDACTONE) 25 MG tablet TAKE 1 TABLET (25 MG TOTAL) BY MOUTH EVERY MORNING. Patient taking differently: Take 25 mg by mouth daily.  04/28/15  Yes Nafziger, Tommi Rumps, NP  Travoprost, BAK Free, (TRAVATAN) 0.004 % SOLN ophthalmic solution Place 1 drop into both eyes at bedtime.   Yes [provider]  traZODone (DESYREL) 50 MG tablet Take 1.5 tablets (75 mg total) by mouth at bedtime as needed for sleep. 10/16/18  Yes Nafziger, Tommi Rumps, NP  verapamil (CALAN-SR) 240 MG CR tablet TAKE 1 TABLET BY MOUTH AT BEDTIME Patient taking differently: Take 240 mg by mouth daily.  02/21/16  Yes Biagio Borg, MD    Family  History Family History  Problem Relation Age of Onset  . Cancer Other        prostate cancer  . Hypertension Other   . Diabetes Other   . Thyroid disease Other   . Hyperlipidemia Other   . Hypertension Other   . Glaucoma Other   . Parkinsonism Neg Hx   . Dementia Neg Hx     Social History Social History   Tobacco Use  . Smoking status: Former Smoker    Last attempt to quit: 03/20/2002    Years since quitting: 16.6  . Smokeless tobacco: Current User    Types: Snuff  Substance Use Topics  . Alcohol use: No  . Drug use: No     Allergies   Warfarin; Doxycycline; and Aricept [donepezil hcl]   Review of Systems Review of Systems  Constitutional: Negative for fever.  Respiratory: Negative for  shortness of breath.   Cardiovascular: Negative for chest pain.  Gastrointestinal: Positive for blood in stool. Negative for abdominal pain, nausea and vomiting.  Genitourinary: Negative for dysuria.  Skin: Negative for wound.  Neurological: Positive for syncope.  Psychiatric/Behavioral: The patient is not nervous/anxious.   All other systems reviewed and are negative.    Physical Exam Updated Vital Signs BP (!) 152/62   Pulse 83   Temp 98 F (36.7 C) (Oral)   Resp 15   Ht 1.651 m (5\' 5" )   Wt 68 kg   SpO2 100%   BMI 24.96 kg/m   Physical Exam Vitals signs and nursing note reviewed.  Constitutional:      Appearance: She is well-developed.     Comments: Elderly but nontoxic-appearing  HENT:     Head: Normocephalic and atraumatic.     Mouth/Throat:     Mouth: Mucous membranes are moist.  Eyes:     Pupils: Pupils are equal, round, and reactive to light.  Cardiovascular:     Rate and Rhythm: Normal rate and regular rhythm.  Pulmonary:     Effort: Pulmonary effort is normal. No respiratory distress.  Abdominal:     General: There is no distension.     Palpations: Abdomen is soft. There is no mass.  Genitourinary:    Comments: Dark stool noted with occasional  blood Musculoskeletal:     Right lower leg: Edema present.     Left lower leg: Edema present.  Skin:    General: Skin is warm and dry.  Neurological:     Mental Status: She is alert.     Comments: Oriented to self and place not time  Psychiatric:        Mood and Affect: Mood normal.      ED Treatments / Results  Labs (all labs ordered are listed, but only abnormal results are displayed) Labs Reviewed  CBC WITH DIFFERENTIAL/PLATELET - Abnormal; Notable for the following components:      Result Value   RBC 2.35 (*)    Hemoglobin 5.7 (*)    HCT 19.8 (*)    MCH 24.3 (*)    MCHC 28.8 (*)    RDW 15.7 (*)    All other components within normal limits  COMPREHENSIVE METABOLIC PANEL - Abnormal; Notable for the following components:   CO2 20 (*)    Glucose, Bld 100 (*)    BUN 30 (*)    Creatinine, Ser 1.85 (*)    Total Protein 6.4 (*)    GFR calc non Af Amer 27 (*)    GFR calc Af Amer 31 (*)    All other components within normal limits  POC OCCULT BLOOD, ED - Abnormal; Notable for the following components:   Fecal Occult Bld POSITIVE (*)    All other components within normal limits  TYPE AND SCREEN  PREPARE RBC (CROSSMATCH)  ABO/RH    EKG EKG Interpretation  Date/Time:  Tuesday November 19 2018 01:46:56 EDT Ventricular Rate:  69 PR Interval:    QRS Duration: 82 QT Interval:  416 QTC Calculation: 446 R Axis:   71 Text Interpretation:  Sinus rhythm Low voltage, precordial leads Confirmed by Thayer Jew [phone]) on 11/19/2018 2:01:35 AM   Radiology No results found.  Procedures Procedures (including critical care time)  CRITICAL CARE Performed by: Merryl Hacker   Total critical care time: 45 minutes  Critical care time was exclusive of separately billable procedures and treating other patients.  Critical care  was necessary to treat or prevent imminent or life-threatening deterioration.  Critical care was time spent personally by me on the following  activities: development of treatment plan with patient and/or surrogate as well as nursing, discussions with consultants, evaluation of patient's response to treatment, examination of patient, obtaining history from patient or surrogate, ordering and performing treatments and interventions, ordering and review of laboratory studies, ordering and review of radiographic studies, pulse oximetry and re-evaluation of patient's condition.   Medications Ordered in ED Medications  pantoprazole (PROTONIX) 80 mg in sodium chloride 0.9 % 100 mL IVPB (80 mg Intravenous New Bag/Given 11/19/18 0347)  pantoprazole (PROTONIX) 80 mg in sodium chloride 0.9 % 250 mL (0.32 mg/mL) infusion (has no administration in time range)  pantoprazole (PROTONIX) injection 40 mg (has no administration in time range)  sodium chloride 0.9 % bolus 1,000 mL (1,000 mLs Intravenous New Bag/Given 11/19/18 0149)  0.9 %  sodium chloride infusion (10 mL/hr Intravenous New Bag/Given 11/19/18 0348)     Initial Impression / Assessment and Plan / ED Course  I have reviewed the triage vital signs and the nursing notes.  Pertinent labs & imaging results that were available during my care of the patient were reviewed by me and considered in my medical decision making (see chart for details).        Patient presents with likely lower GI bleed.  She is on Xarelto.  Reported syncope at home and hypotensive initially but blood pressure improved.  She is overall nontoxic-appearing.  She has dark tarry stool with blood noted on exam.  Lab work obtained.  IV access obtained.  Patient was given fluids.  Hemoglobin 5.7.  Patient typed and screened for 2 units of blood.  She was started on IV Protonix.  Will discuss with gastroenterology.  She has been hemodynamically stable in the ED.  Daughters were updated by nursing.  3:59 AM Discussed the patient with Dr. Fuller Plan, GI.  Given that the patient is hemodynamically stable, will be assessed first thing in  the morning.  Agrees with above treatment plan.  Will consult the admitting hospitalist.  Final Clinical Impressions(s) / ED Diagnoses   Final diagnoses:  Lower GI bleed  Syncope, non cardiac  Acute blood loss anemia    ED Discharge Orders    None       Yedidya Duddy, Barbette Hair, MD 11/19/18 0400

## 2018-11-19 NOTE — Progress Notes (Signed)
Initial Nutrition Assessment  INTERVENTION:   -Once diet advanced, recommend Ensure Enlive po BID, each supplement provides 350 kcal and 20 grams of protein  NUTRITION DIAGNOSIS:   Unintentional weight loss related to (dementia, h/o CVA) as evidenced by percent weight loss.  GOAL:   Patient will meet greater than or equal to 90% of their needs  MONITOR:   PO intake, Diet advancement, Supplement acceptance, Labs, Weight trends, I & O's  REASON FOR ASSESSMENT:   Malnutrition Screening Tool    ASSESSMENT:   73 y.o. 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    **RD working remotely**  Patient currently on clear liquids. Per GI note, pt to be NPO on 4/22 for EGD in the AM. Pt reports no changes to appetite. Pt drinks Ensure supplements at home, RD to resume supplements once diet is advanced. Suspect intakes have not been ideal given continued weight loss.  Per weight records, pt has lost 23 lb since 2/1 (13% wt loss x 2.5 months, significant for time frame).   Medications reviewed. Labs reviewed: GFR: 31  NUTRITION - FOCUSED PHYSICAL EXAM:  Unable to perform per department requirement to work remotely.  Diet Order:   Diet Order            Diet NPO time specified  Diet effective midnight        Diet clear liquid Room service appropriate? Yes; Fluid consistency: Thin  Diet effective now              EDUCATION NEEDS:   No education needs have been identified at this time  Skin:  Skin Assessment: Reviewed RN Assessment  Last BM:  4/21  Height:   Ht Readings from Last 1 Encounters:  11/19/18 5\' 5"  (1.651 m)    Weight:   Wt Readings from Last 1 Encounters:  11/19/18 68 kg    Ideal Body Weight:  56.8 kg  BMI:  Body mass index is 24.96  kg/m.  Estimated Nutritional Needs:   Kcal:  1600-1800  Protein:  75-85g  Fluid:  1.8L/day  Clayton Bibles, MS, RD, LDN [place] Dietitian Pager: [phone] After Hours Pager: [phone]

## 2018-11-19 NOTE — ED Notes (Signed)
Spoke to lab, blood is ready for pick up to transfuse.

## 2018-11-19 NOTE — ED Notes (Signed)
Pt said gave verbal authorization to speak with her daughter [name] [phone] regarding her hospitalization.

## 2018-11-19 NOTE — H&P (Signed)
History and Physical    Stacy Brennan MVE:[id number] DOB: Oct 25, 1945 DOA: 11/19/2018  PCP: Dorothyann Peng, NP Patient coming from: Home  Chief Complaint: Syncope, GI bleed  HPI: Stacy Brennan is a 73 y.o. 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  Per EMS report, patient has baseline dementia with mild confusion.  She was found to have 200 cc tarry stool around her and more in the toilet.  Blood pressure 112/55 and heart rate 72 by EMS.  Patient states she fell in the bathroom today.  Denies any dizziness/lightheadedness, chest pain, or shortness of breath prior to the fall.  She is not sure if she lost consciousness.  Denies any injuries from the fall or pain anywhere.  Denies any headache, neck pain, or back pain.  Patient thinks her daughter caught her when she fell.  Does report noticing dark stool and spots of blood in her stool last week but is not sure of the exact timeline.  Denies any abdominal pain or hematemesis.  Reports over-the-counter NSAID use almost daily.  Denies any prior history of GI bleed.  Denies any fevers, chills, sick contacts, recent travel, or exposure to any individual with confirmed COVID-19.  Review of Systems: As per HPI otherwise 10 point review of systems negative.  Past Medical History:  Diagnosis Date   Allergic rhinitis, cause unspecified 01/01/2014   Anxiety state, unspecified 01/01/2014   Cataract    Cognitive impairment    Depression 01/01/2014   Glaucoma    Hypertension    Other and unspecified hyperlipidemia 01/01/2014   Renal insufficiency 01/01/2014   Stroke ([place]) 2002   CVA x 2   Unspecified asthma(493.90) 01/01/2014   Vision decreased     Past Surgical History:  Procedure Laterality Date   DILATION AND CURETTAGE OF UTERUS       THROMBECTOMY BRACHIAL ARTERY Left 08/30/2018   Procedure: LEFT SUBCLAVIAN ARTERY THROMBECTOMY;  Surgeon: Waynetta Sandy, MD;  Location: [place];  Service: Vascular;  Laterality: Left;   TUBALIZATION       reports that she quit smoking about 16 years ago. Her smokeless tobacco use includes snuff. She reports that she does not drink alcohol or use drugs.  Allergies  Allergen Reactions   Warfarin Palpitations   Doxycycline Rash   Aricept [Donepezil Hcl] Other (See Comments)    Insomnia and not eating     Family History  Problem Relation Age of Onset   Cancer Other        prostate cancer   Hypertension Other    Diabetes Other    Thyroid disease Other    Hyperlipidemia Other    Hypertension Other    Glaucoma Other    Parkinsonism Neg Hx    Dementia Neg Hx     Prior to Admission medications   Medication Sig Start Date End Date Taking? Authorizing Provider  acetaminophen (TYLENOL) 500 MG tablet Take 1,000 mg by mouth every 6 (six) hours as needed for moderate pain.   Yes [provider]  alendronate (FOSAMAX) 70 MG tablet Take 1 tablet (70 mg total) by mouth once a week. Take with a full glass of water on an empty stomach. Patient taking differently: Take 70 mg by mouth every Monday. Take with a full glass of water on an empty stomach. 01/21/15  Yes  Nafziger, Tommi Rumps, NP  ALPRAZolam Duanne Moron) 0.5 MG tablet Take 1 tablet (0.5 mg total) by mouth 2 (two) times daily. Patient taking differently: Take 0.5 mg by mouth 3 (three) times daily as needed for anxiety.  09/03/15  Yes Nafziger, Tommi Rumps, NP  atorvastatin (LIPITOR) 20 MG tablet Take 1 tablet (20 mg total) by mouth daily. 06/11/18  Yes Nafziger, Tommi Rumps, NP  brimonidine (ALPHAGAN) 0.2 % ophthalmic solution Place 1 drop into both eyes 2 (two) times daily.  04/17/18  Yes [provider]  chlorhexidine (PERIDEX) 0.12 % solution Use as directed 15 mLs in the mouth or throat daily as needed. Gum Diease   Yes  [provider]  diphenhydrAMINE (BENADRYL) 25 MG tablet Take 50 mg by mouth every 6 (six) hours as needed for allergies.   Yes [provider]  dorzolamide-timolol (COSOPT) 22.3-6.8 MG/ML ophthalmic solution Place 1 drop into both eyes 2 (two) times daily.   Yes [provider]  fluticasone (FLONASE) 50 MCG/ACT nasal spray Place 1 spray into both nostrils daily as needed for allergies or rhinitis.   Yes [provider]  ibuprofen (ADVIL) 200 MG tablet Take 600 mg by mouth every 8 (eight) hours as needed for moderate pain.   Yes [provider]  labetalol (NORMODYNE) 200 MG tablet TAKE 1 TABLET BY MOUTH TWICE DAILY Patient taking differently: Take 200 mg by mouth 2 (two) times daily.  02/22/16  Yes Nafziger, Tommi Rumps, NP  methylcellulose oral powder Take 1 packet by mouth daily.   Yes [provider]  Phenylephrine-Acetaminophen (SINUS AND HEADACHE) 5-325 MG TABS Take 2 tablets by mouth every 6 (six) hours as needed. Congestion and headache   Yes [provider]  RHOPRESSA 0.02 % SOLN Place 1 drop into both eyes daily.  05/15/18  Yes [provider]  rivaroxaban (XARELTO) 20 MG TABS tablet Take 1 tablet (20 mg total) by mouth daily with supper. 09/20/18  Yes Waynetta Sandy, MD  sennosides-docusate sodium (SENOKOT-S) 8.6-50 MG tablet Take 1 tablet by mouth daily.   Yes [provider]  spironolactone (ALDACTONE) 25 MG tablet TAKE 1 TABLET (25 MG TOTAL) BY MOUTH EVERY MORNING. Patient taking differently: Take 25 mg by mouth daily.  04/28/15  Yes Nafziger, Tommi Rumps, NP  Travoprost, BAK Free, (TRAVATAN) 0.004 % SOLN ophthalmic solution Place 1 drop into both eyes at bedtime.   Yes [provider]  traZODone (DESYREL) 50 MG tablet Take 1.5 tablets (75 mg total) by mouth at bedtime as needed for sleep. 10/16/18  Yes Nafziger, Tommi Rumps, NP  verapamil (CALAN-SR) 240 MG CR tablet TAKE 1 TABLET BY MOUTH AT BEDTIME Patient  taking differently: Take 240 mg by mouth daily.  02/21/16  Yes Biagio Borg, MD    Physical Exam: Vitals:   11/19/18 0405 11/19/18 0430 11/19/18 0500 11/19/18 0530  BP: (!) 151/63 (!) 128/48 134/60 128/65  Pulse: 83 86 73 74  Resp: 17 (!) 22 15 19   Temp:      TempSrc:      SpO2: 100% 100% 100% 100%  Weight:      Height:        Physical Exam  Constitutional: She appears well-developed and well-nourished. No distress.  HENT:  Head: Normocephalic and atraumatic.  Mouth/Throat: Oropharynx is clear and moist.  Eyes: Pupils are equal, round, and reactive to light. Right eye exhibits no discharge. Left eye exhibits no discharge.  Neck: Neck supple.  Cardiovascular: Normal rate, regular rhythm and intact distal pulses.  Pulmonary/Chest: Effort normal and breath sounds normal. No respiratory distress. She has no wheezes. She has no rales.  Abdominal: Soft. Bowel sounds are normal. She exhibits no distension. There is no abdominal tenderness. There is no rebound and no guarding.  Musculoskeletal:        General: No edema.  Neurological: No cranial nerve deficit.  Awake and alert Strength 5 out of 5 in bilateral upper and lower extremities. Sensation to light touch intact throughout.  Skin: Skin is warm and dry. She is not diaphoretic.     Labs on Admission: I have personally reviewed following labs and imaging studies  CBC: Recent Labs  Lab 11/19/18 0144  WBC 8.6  NEUTROABS 5.8  HGB 5.7*  HCT 19.8*  MCV 84.3  PLT 081   Basic Metabolic Panel: Recent Labs  Lab 11/19/18 0144  NA 137  K 3.9  CL 108  CO2 20*  GLUCOSE 100*  BUN 30*  CREATININE 1.85*  CALCIUM 9.0   GFR: Estimated Creatinine Clearance: 24.4 mL/min (A) (by C-G formula based on SCr of 1.85 mg/dL (H)). Liver Function Tests: Recent Labs  Lab 11/19/18 0144  AST 16  ALT 11  ALKPHOS 52  BILITOT 0.3  PROT 6.4*  ALBUMIN 3.8   No results for input(s): LIPASE, AMYLASE in the last 168 hours. No results  for input(s): AMMONIA in the last 168 hours. Coagulation Profile: No results for input(s): INR, PROTIME in the last 168 hours. Cardiac Enzymes: No results for input(s): CKTOTAL, CKMB, CKMBINDEX, TROPONINI in the last 168 hours. BNP (last 3 results) No results for input(s): PROBNP in the last 8760 hours. HbA1C: No results for input(s): HGBA1C in the last 72 hours. CBG: No results for input(s): GLUCAP in the last 168 hours. Lipid Profile: No results for input(s): CHOL, HDL, LDLCALC, TRIG, CHOLHDL, LDLDIRECT in the last 72 hours. Thyroid Function Tests: No results for input(s): TSH, T4TOTAL, FREET4, T3FREE, THYROIDAB in the last 72 hours. Anemia Panel: No results for input(s): VITAMINB12, FOLATE, FERRITIN, TIBC, IRON, RETICCTPCT in the last 72 hours. Urine analysis: No results found for: COLORURINE, APPEARANCEUR, LABSPEC, PHURINE, GLUCOSEU, HGBUR, BILIRUBINUR, KETONESUR, PROTEINUR, UROBILINOGEN, NITRITE, LEUKOCYTESUR  Radiological Exams on Admission: No results found.  EKG: Independently reviewed.  Sinus rhythm.  Assessment/Plan Principal Problem:   GI bleed Active Problems:   Hypertension   Melena   Syncope   AKI (acute kidney injury) ([place])   CKD (chronic kidney disease) stage 3, GFR 30-59 ml/min (HCC)   Melena, suspect upper GI bleed -Currently on Xarelto for recent history of left upper extremity thrombosis in February 2020.  Reports daily NSAID use.  Differentials include peptic ulcer disease versus gastritis. -200 cc dark tarry stool noted on the bathroom floor and more in the toilet by EMS.  Hemodynamically stable.  Hemoglobin 5.7, was 11.8 on 09/01/2018.  No further episodes of melena in the hospital.  No documented history of GI bleed.  No prior EGD or colonoscopy results in the chart. -Type and screen -2 units PRBCs ordered in the ED -IV fluid -IV PPI bolus and infusion -Posttransfusion H&H -Hold Xarelto -Keep n.p.o. -Patient will need an EGD.  ED provider discussed  the case with GI, will consult in the morning.  Syncope, fall -History limited as per patient has dementia and baseline mild confusion.  She denies any dizziness/lightheadedness, chest pain, shortness of breath prior to the fall.  No obvious injury from the fall.  No complaints of headache, neck pain, or back pain.  Neuro  exam nonfocal.  Syncope likely due to hypotension in the setting of acute blood loss.  -Cardiac monitoring -IV fluid hydration -Check orthostatics  AKI on CKD 3 -BUN 30.  Creatinine 1.8, baseline 1.3-1.4.  Likely prerenal. -IV fluid hydration -Avoid nephrotoxic agents/contrast -Continue to monitor BMP -Monitor urine output  Recent history of left upper extremity thrombosis on Xarelto Patient was admitted from August 29, 2018 to September 01, 2018 for acute left upper extremity ischemia.  She was found to have acute thrombosis of left upper extremity and underwent exposure of left brachial artery with left arm thromboembolectomy.  Currently on Xarelto for anticoagulation. -Hold Xarelto in the setting of active GI bleed  Hypertension -Hold antihypertensives in the setting of GI bleed  DVT prophylaxis: SCDs Code Status: Full code Family Communication: No family available. Disposition Plan: Anticipate discharge after clinical improvement. Consults called: GI (Dr. Fuller Plan) Admission status: Observation, telemetry  This chart was dictated using voice recognition software.  Despite best efforts to proofread, errors can occur which can change the documentation meaning.  Shela Leff MD [facility] Pager [phone]  If 7PM-7AM, please contact night-coverage [email or web address] Password [facility]  11/19/2018, 5:51 AM

## 2018-11-19 NOTE — ED Notes (Signed)
Bed: ZH46 Expected date: 11/19/18 Expected time: 1:27 AM Means of arrival: Ambulance Comments: EMS 73 yo F GI bleed-rectal-200 cc EBL-on xarelto-syncope secondary to same

## 2018-11-19 NOTE — ED Notes (Addendum)
Called daughter [name] and she said following regarding the pt:  --Has moderate dementia.  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 --Pt was given Trazodone 50 mg at 11/18/18 10:00 PM. --Pt given Xanax 0.5 mg 11/18/18 7:30 PM.  --Pt given regular evening meds 11/18/18 7:30-8:00 PM.

## 2018-11-19 NOTE — ED Notes (Signed)
Attempted to call report. Advised to call back in 10-15 minutes.

## 2018-11-19 NOTE — ED Notes (Signed)
Date and time results received: 11/19/18 0228 (use smartphrase ".now" to insert current time)  Test: Hemoglobin  Critical Value: 5.7  Name of Provider Notified: [facility]  Orders Received? Or Actions Taken?: Provider made aware.

## 2018-11-19 NOTE — ED Notes (Signed)
At pt beside for first 15 minutes of blood transfusion.

## 2018-11-19 NOTE — Progress Notes (Signed)
PROGRESS NOTE    Stacy Brennan  WEX:[id number] DOB: [birth year] DOA: 11/19/2018 PCP: Dorothyann Peng, NP    Brief Narrative:   Stacy Brennan is a 73 y.o. 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  Per EMS report, patient has baseline dementia with mild confusion.  She was found to have 200 cc tarry stool around her and more in the toilet.  Blood pressure 112/55 and heart rate 72 by EMS.  Patient states she fell in the bathroom today.  Denies any dizziness/lightheadedness, chest pain, or shortness of breath prior to the fall.  She is not sure if she lost consciousness.  Denies any injuries from the fall or pain anywhere.  Denies any headache, neck pain, or back pain.  Patient thinks her daughter caught her when she fell.  Does report noticing dark stool and spots of blood in her stool last week but is not sure of the exact timeline.  Denies any abdominal pain or hematemesis.  Reports over-the-counter NSAID use almost daily.  Denies any prior history of GI bleed.  Denies any fevers, chills, sick contacts, recent travel, or exposure to any individual with confirmed COVID-19.  Assessment & Plan:   Principal Problem:   GI bleed Active Problems:   Hypertension   Melena   Syncope   AKI (acute kidney injury) (HCC)   CKD (chronic kidney disease) stage 3, GFR 30-59 ml/min (HCC)   Acute blood loss anemia Upper GI bleed Patient presenting with dark tarry stools.  History of recent left upper extremity DVT February 2020 currently on Xarelto outpatient.  Also reports daily NSAID use.  Likely peptic ulcer versus gastritis.  Hemoglobin noted on admission 5.7; previously 11.8 on 09/01/2018. --[facility] consulted, and following --Transfused 2 units PRBCs --Continue PPI  drip --Holding Xarelto --GI plans EGD in the a.m.  Syncopal versus presyncopal episode Etiology likely secondary to acute blood loss anemia.  EGD notable for sinus rhythm, rate 69, no concerning ST elevation/depression or T wave inversions, --Continue to monitor on telemetry --Supportive care, IV fluid hydration, blood transfusion as above --PT eval following EGD for discharge needs  Hypotension Hx essential hypertension BP on admission 68/54.  Etiology likely from blood loss anemia as above.  Home antihypertensive regimen includes labetalol 200 mg p.o. twice daily, spironolactone 25 mg p.o. daily, and verapamil 240 mg p.o. nightly. --BP improved with IV fluid hydration and blood administration --Continue labetalol --Hold spironolactone and verapamil for now --To monitor blood pressure closely  Acute renal failure on CKD stage III Creatinine 1.85 on admission.  Baseline 1.3-1.4.  Etiology likely ATN from hypotension and blood loss anemia. --Continue IV fluids, transfusion of PRBCs --Avoid nephrotoxins agents --Denies nose --Repeat BMP in a.m.  History of left upper extremity DVT Admitted from August 29, 2018 to September 01, 2018 for acute left upper extremity ischemia. She was found to have acute thrombosis of left upper extremity and underwent exposure of left brachial artery with left arm thromboembolectomy.  Currently on Xarelto for anticoagulation. --Holding Xarelto in the setting of active GI bleed    DVT prophylaxis: SCDs, chemical DVT prophylaxis contraindicated in acute GI bleed Code Status: Full code Family Communication: None Disposition Plan: Inpatient   Consultants:   [facility]  Procedures:   Pending EGD  Antimicrobials:   None   Subjective: Patient  seen and examined at bedside, resting comfortably.  Currently receiving blood transfusion.  Already feeling better since admission.  No more reported dark stools since admission.  Has been seen  by GI this morning with plans for EGD tomorrow.  No other complaints at this time.  Denies headache, no fever/chills/night sweats, no nausea/vomiting/diarrhea, no chest pain, no palpitations, no abdominal pain.  No acute events overnight per nursing staff.  Objective: Vitals:   11/19/18 0910 11/19/18 0942 11/19/18 1351 11/19/18 1407  BP: 125/70 (!) 142/54 137/60 (!) 139/53  Pulse: (!) 57 (!) 59 70 65  Resp: 16 18 18 18   Temp: 97.9 F (36.6 C) 98.2 F (36.8 C) 98.4 F (36.9 C) 98.7 F (37.1 C)  TempSrc: Oral Oral Oral Oral  SpO2: 100% 100% 100% 100%  Weight:      Height:        Intake/Output Summary (Last 24 hours) at 11/19/2018 1537 Last data filed at 11/19/2018 1351 Gross per 24 hour  Intake 1981 ml  Output 1 ml  Net 1980 ml   Filed Weights   11/19/18 0137  Weight: 68 kg    Examination:  General exam: Appears calm and comfortable  Respiratory system: Clear to auscultation. Respiratory effort normal. Cardiovascular system: S1 & S2 heard, RRR. No JVD, murmurs, rubs, gallops or clicks. No pedal edema. Gastrointestinal system: Abdomen is nondistended, soft and nontender. No organomegaly or masses felt. Normal bowel sounds heard. Central nervous system: Alert and oriented. No focal neurological deficits. Extremities: Symmetric 5 x 5 power. Skin: No rashes, lesions or ulcers Psychiatry: Judgement and insight appear normal. Mood & affect appropriate.     Data Reviewed: I have personally reviewed following labs and imaging studies  CBC: Recent Labs  Lab 11/19/18 0144  WBC 8.6  NEUTROABS 5.8  HGB 5.7*  HCT 19.8*  MCV 84.3  PLT 224   Basic Metabolic Panel: Recent Labs  Lab 11/19/18 0144  NA 137  K 3.9  CL 108  CO2 20*  GLUCOSE 100*  BUN 30*  CREATININE 1.85*  CALCIUM 9.0   GFR: Estimated Creatinine Clearance: 24.4 mL/min (A) (by C-G formula based on SCr of 1.85 mg/dL (H)). Liver Function Tests: Recent Labs  Lab 11/19/18 0144  AST 16  ALT 11  ALKPHOS  52  BILITOT 0.3  PROT 6.4*  ALBUMIN 3.8   No results for input(s): LIPASE, AMYLASE in the last 168 hours. No results for input(s): AMMONIA in the last 168 hours. Coagulation Profile: No results for input(s): INR, PROTIME in the last 168 hours. Cardiac Enzymes: No results for input(s): CKTOTAL, CKMB, CKMBINDEX, TROPONINI in the last 168 hours. BNP (last 3 results) No results for input(s): PROBNP in the last 8760 hours. HbA1C: No results for input(s): HGBA1C in the last 72 hours. CBG: No results for input(s): GLUCAP in the last 168 hours. Lipid Profile: No results for input(s): CHOL, HDL, LDLCALC, TRIG, CHOLHDL, LDLDIRECT in the last 72 hours. Thyroid Function Tests: No results for input(s): TSH, T4TOTAL, FREET4, T3FREE, THYROIDAB in the last 72 hours. Anemia Panel: No results for input(s): VITAMINB12, FOLATE, FERRITIN, TIBC, IRON, RETICCTPCT in the last 72 hours. Sepsis Labs: No results for input(s): PROCALCITON, LATICACIDVEN in the last 168 hours.  No results found for this or any previous visit (from the past 240 hour(s)).       Radiology Studies: No results found.      Scheduled Meds:  atorvastatin  20 mg Oral Daily   brimonidine  1 drop Both  Eyes BID   dorzolamide-timolol  1 drop Both Eyes BID   labetalol  200 mg Oral BID   latanoprost  1 drop Both Eyes QHS   Netarsudil Dimesylate  1 drop Both Eyes Daily   [START ON 11/22/2018] pantoprazole  40 mg Intravenous Q12H   verapamil  240 mg Oral Daily   Continuous Infusions:  sodium chloride 125 mL/hr at 11/19/18 0508   pantoprozole (PROTONIX) infusion 8 mg/hr (11/19/18 0418)     LOS: 0 days    Time spent: 36 minutes    Callahan Peddie J [place] DO [facility] Pager [phone]  If 7PM-7AM, please contact night-coverage [email or web address] Password [facility] 11/19/2018, 3:37 PM

## 2018-11-19 NOTE — ED Notes (Addendum)
Called daughter [name] and updated her on patient status. Called Kim from patient's cell phone and gave cell phone to patient to speak with her daughter.

## 2018-11-19 NOTE — Consult Note (Signed)
Consultation  Referring Provider: [facility]/[place] DO Primary Care Physician:  Dorothyann Peng, NP Primary Gastroenterologist:   none  Reason for Consultation:  Melena, syncope, anemia  HPI: Stacy Brennan is a 73 y.o. female who we are asked to evaluate for profound anemia and history of melena.  Patient has no prior GI history.  She was admitted early this morning through the emergency room after brought in by family after an episode of syncope/presyncope.  EMS found about 200 cc of tarry stool that patient had passed at home. Patient has been on Xarelto since February 2020 after she developed a left upper extremity thrombosis and had undergone embolectomy.  She has history of hypertension chronic kidney disease stage III, prior history of CVA and history of dementia. He has been hemodynamically stable since arrival not passed any further stool. Work-up in the ER with finding of hemoglobin of 5.7 hematocrit 19.8 MCV of 84.5.  She is to receive 2 units of packed RBCs has been started on Protonix infusion.  Baseline hemoglobin 11.8 in February 2020  She denies any recent abdominal discomfort indigestion heartburn nausea vomiting.  She says her appetite has been fine and her bowel movements have been normal for her She says she had not noticed any blood or black stools over the past week or so. She does take Advil sinus on a regular basis usually 2 every morning and 2 every evening for chronic sinusitis issues. She tells me that she took her Xarelto yesterday evening.   Past Medical History:  Diagnosis Date  . Allergic rhinitis, cause unspecified 01/01/2014  . Anxiety state, unspecified 01/01/2014  . Cataract   . Cognitive impairment   . Depression 01/01/2014  . Glaucoma   . Hypertension   . Other and unspecified hyperlipidemia 01/01/2014  . Renal insufficiency 01/01/2014  . Stroke [facility]) 2002   CVA x 2  . Unspecified asthma(493.90) 01/01/2014  . Vision decreased     Past  Surgical History:  Procedure Laterality Date  . DILATION AND CURETTAGE OF UTERUS    . THROMBECTOMY BRACHIAL ARTERY Left 08/30/2018   Procedure: LEFT SUBCLAVIAN ARTERY THROMBECTOMY;  Surgeon: Waynetta Sandy, MD;  Location: [place];  Service: Vascular;  Laterality: Left;  . TUBALIZATION      Prior to Admission medications   Medication Sig Start Date End Date Taking? Authorizing Provider  acetaminophen (TYLENOL) 500 MG tablet Take 1,000 mg by mouth every 6 (six) hours as needed for moderate pain.   Yes [provider]  alendronate (FOSAMAX) 70 MG tablet Take 1 tablet (70 mg total) by mouth once a week. Take with a full glass of water on an empty stomach. Patient taking differently: Take 70 mg by mouth every Monday. Take with a full glass of water on an empty stomach. 01/21/15  Yes Nafziger, Tommi Rumps, NP  ALPRAZolam (XANAX) 0.5 MG tablet Take 1 tablet (0.5 mg total) by mouth 2 (two) times daily. Patient taking differently: Take 0.5 mg by mouth 3 (three) times daily as needed for anxiety.  09/03/15  Yes Nafziger, Tommi Rumps, NP  atorvastatin (LIPITOR) 20 MG tablet Take 1 tablet (20 mg total) by mouth daily. 06/11/18  Yes Nafziger, Tommi Rumps, NP  brimonidine (ALPHAGAN) 0.2 % ophthalmic solution Place 1 drop into both eyes 2 (two) times daily.  04/17/18  Yes [provider]  chlorhexidine (PERIDEX) 0.12 % solution Use as directed 15 mLs in the mouth or throat daily as needed. Gum Diease   Yes [provider]  diphenhydrAMINE (BENADRYL) 25 MG tablet Take 50 mg by mouth every 6 (six) hours as needed for allergies.   Yes [provider]  dorzolamide-timolol (COSOPT) 22.3-6.8 MG/ML ophthalmic solution Place 1 drop into both eyes 2 (two) times daily.   Yes [provider]  fluticasone (FLONASE) 50 MCG/ACT nasal spray Place 1 spray into both nostrils daily as needed for allergies or rhinitis.   Yes [provider]  ibuprofen (ADVIL) 200 MG tablet Take 600 mg by  mouth every 8 (eight) hours as needed for moderate pain.   Yes [provider]  labetalol (NORMODYNE) 200 MG tablet TAKE 1 TABLET BY MOUTH TWICE DAILY Patient taking differently: Take 200 mg by mouth 2 (two) times daily.  02/22/16  Yes Nafziger, Tommi Rumps, NP  methylcellulose oral powder Take 1 packet by mouth daily.   Yes [provider]  Phenylephrine-Acetaminophen (SINUS AND HEADACHE) 5-325 MG TABS Take 2 tablets by mouth every 6 (six) hours as needed. Congestion and headache   Yes [provider]  RHOPRESSA 0.02 % SOLN Place 1 drop into both eyes daily.  05/15/18  Yes [provider]  rivaroxaban (XARELTO) 20 MG TABS tablet Take 1 tablet (20 mg total) by mouth daily with supper. 09/20/18  Yes Waynetta Sandy, MD  sennosides-docusate sodium (SENOKOT-S) 8.6-50 MG tablet Take 1 tablet by mouth daily.   Yes [provider]  spironolactone (ALDACTONE) 25 MG tablet TAKE 1 TABLET (25 MG TOTAL) BY MOUTH EVERY MORNING. Patient taking differently: Take 25 mg by mouth daily.  04/28/15  Yes Nafziger, Tommi Rumps, NP  Travoprost, BAK Free, (TRAVATAN) 0.004 % SOLN ophthalmic solution Place 1 drop into both eyes at bedtime.   Yes [provider]  traZODone (DESYREL) 50 MG tablet Take 1.5 tablets (75 mg total) by mouth at bedtime as needed for sleep. 10/16/18  Yes Nafziger, Tommi Rumps, NP  verapamil (CALAN-SR) 240 MG CR tablet TAKE 1 TABLET BY MOUTH AT BEDTIME Patient taking differently: Take 240 mg by mouth daily.  02/21/16  Yes Biagio Borg, MD    Current Facility-Administered Medications  Medication Dose Route Frequency Provider Last Rate Last Dose  . 0.9 %  sodium chloride infusion   Intravenous Continuous Shela Leff, MD 125 mL/hr at 11/19/18 3235    . acetaminophen (TYLENOL) tablet 650 mg  650 mg Oral Q6H PRN Shela Leff, MD       Or  . acetaminophen (TYLENOL) suppository 650 mg  650 mg Rectal Q6H PRN Shela Leff, MD      . pantoprazole  (PROTONIX) 80 mg in sodium chloride 0.9 % 250 mL (0.32 mg/mL) infusion  8 mg/hr Intravenous Continuous Shela Leff, MD 25 mL/hr at 11/19/18 0418 8 mg/hr at 11/19/18 0418  . [START ON 11/22/2018] pantoprazole (PROTONIX) injection 40 mg  40 mg Intravenous Q12H Shela Leff, MD        Allergies as of 11/19/2018 - Review Complete 11/19/2018  Allergen Reaction Noted  . Warfarin Palpitations 07/11/2015  . Doxycycline Rash 06/30/2016  . Aricept [donepezil hcl] Other (See Comments) 09/19/2016    Family History  Problem Relation Age of Onset  . Cancer Other        prostate cancer  . Hypertension Other   . Diabetes Other   . Thyroid disease Other   . Hyperlipidemia Other   . Hypertension Other   . Glaucoma Other   . Parkinsonism Neg Hx   . Dementia Neg Hx     Social History  Socioeconomic History  . Marital status: Single    Spouse name: Not on file  . Number of children: 4  . Years of education: 22  . Highest education level: Not on file  Occupational History  . Occupation: Financial risk analyst  . Financial resource strain: Not on file  . Food insecurity:    Worry: Not on file    Inability: Not on file  . Transportation needs:    Medical: Not on file    Non-medical: Not on file  Tobacco Use  . Smoking status: Former Smoker    Last attempt to quit: 03/20/2002    Years since quitting: 16.6  . Smokeless tobacco: Current User    Types: Snuff  Substance and Sexual Activity  . Alcohol use: No  . Drug use: No  . Sexual activity: Not on file  Lifestyle  . Physical activity:    Days per week: Not on file    Minutes per session: Not on file  . Stress: Not on file  Relationships  . Social connections:    Talks on phone: Not on file    Gets together: Not on file    Attends religious service: Not on file    Active member of club or organization: Not on file    Attends meetings of clubs or organizations: Not on file    Relationship status: Not on file   . Intimate partner violence:    Fear of current or ex partner: Not on file    Emotionally abused: Not on file    Physically abused: Not on file    Forced sexual activity: Not on file  Other Topics Concern  . Not on file  Social History Narrative   Patient is single with 4 children.   Patient is right handed.   Patient has a high school education.   Patient drinks 1 cup a week.    Review of Systems: Pertinent positive and negative review of systems were noted in the above HPI section.  All other review of systems was otherwise negative.Marland Kitchen  Physical Exam: Vital signs in last 24 hours: Temp:  [98 F (36.7 C)-98.7 F (37.1 C)] 98.7 F (37.1 C) (04/21 0634) Pulse Rate:  [73-86] 75 (04/21 0634) Resp:  [12-23] 20 (04/21 0634) BP: (68-152)/(45-65) 130/62 (04/21 0634) SpO2:  [100 %] 100 % (04/21 0634) Weight:  [68 kg] 68 kg (04/21 0137)   General:   Alert,  Well-developed, well-nourished, elderly African-American female, sitting up in chair pleasant and cooperative in NAD-confused to time  head:  Normocephalic and atraumatic. Eyes:  Sclera clear, no icterus.   Conjunctiva pale Ears:  Normal auditory acuity. Nose:  No deformity, discharge,  or lesions. Mouth:  No deformity or lesions.   Neck:  Supple; no masses or thyromegaly. Lungs:  Clear throughout to auscultation.   No wheezes, crackles, or rhonchi.  Heart:  Regular rate and rhythm; no murmurs, clicks, rubs,  or gallops. Abdomen:  Soft,nontender, BS active,nonpalp mass or hsm.   Rectal:  Deferred  Msk:  Symmetrical without gross deformities. . Pulses:  Normal pulses noted. Extremities:  Without clubbing or edema. Neurologic:  Alert and  oriented x2  grossly normal neurologically. Skin:  Intact without significant lesions or rashes.. Psych:  Alert and cooperative. Normal mood and affect.  Intake/Output from previous day: 04/20 0701 - 04/21 0700 In: 1731 [I.V.:350; Blood:311; IV Piggyback:1070] Out: 1 [Stool:1]  Intake/Output this shift: No intake/output data recorded.  Lab Results: Recent Labs  11/19/18 0144  WBC 8.6  HGB 5.7*  HCT 19.8*  PLT 233   BMET Recent Labs    11/19/18 0144  NA 137  K 3.9  CL 108  CO2 20*  GLUCOSE 100*  BUN 30*  CREATININE 1.85*  CALCIUM 9.0   LFT Recent Labs    11/19/18 0144  PROT 6.4*  ALBUMIN 3.8  AST 16  ALT 11  ALKPHOS 52  BILITOT 0.3   PT/INR No results for input(s): LABPROT, INR in the last 72 hours.   IMPRESSION:  #12 73 year old African-American female dated earlier this morning after syncopal episode at home and passage of large tarry stool Hemoglobin 5.7 on arrival And has been on Xarelto over the past 67months and also takes Advil regularly. 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 after midnight  2 units of packed RBCs have been ordered, will need serial hemoglobins and transfusions to keep hemoglobin between 7 and 8  Protonix infusion has been started  As patient is not actively hemorrhaging this morning we will plan for EGD at 8 AM tomorrow morning with Dr. Bryan Lemma which will allow time for Xarelto to washout. If patient has evidence of active hemorrhage or hemodynamic instability today EGD can be done emergently but therapeutic options are more limited  Thank you will follow with you     Amy Esterwood PA-C 11/19/2018, 9:00 AM

## 2018-11-20 ENCOUNTER — Observation Stay (HOSPITAL_COMMUNITY): Payer: Medicare Other | Admitting: Anesthesiology

## 2018-11-20 ENCOUNTER — Telehealth: Payer: Self-pay | Admitting: *Deleted

## 2018-11-20 ENCOUNTER — Encounter (HOSPITAL_COMMUNITY)
Admission: EM | Disposition: A | Discharge status: Home / Self Care | Payer: Self-pay | Source: Home / Self Care | Attending: Internal Medicine

## 2018-11-20 ENCOUNTER — Encounter (HOSPITAL_COMMUNITY): Payer: Self-pay | Admitting: Anesthesiology

## 2018-11-20 DIAGNOSIS — K222 Esophageal obstruction: Secondary | ICD-10-CM

## 2018-11-20 DIAGNOSIS — F0390 Unspecified dementia without behavioral disturbance: Secondary | ICD-10-CM | POA: Diagnosis not present

## 2018-11-20 DIAGNOSIS — K264 Chronic or unspecified duodenal ulcer with hemorrhage: Secondary | ICD-10-CM | POA: Diagnosis not present

## 2018-11-20 DIAGNOSIS — Z8249 Family history of ischemic heart disease and other diseases of the circulatory system: Secondary | ICD-10-CM | POA: Diagnosis not present

## 2018-11-20 DIAGNOSIS — K259 Gastric ulcer, unspecified as acute or chronic, without hemorrhage or perforation: Secondary | ICD-10-CM | POA: Diagnosis not present

## 2018-11-20 DIAGNOSIS — K269 Duodenal ulcer, unspecified as acute or chronic, without hemorrhage or perforation: Secondary | ICD-10-CM

## 2018-11-20 DIAGNOSIS — H409 Unspecified glaucoma: Secondary | ICD-10-CM | POA: Diagnosis not present

## 2018-11-20 DIAGNOSIS — K208 Other esophagitis: Secondary | ICD-10-CM | POA: Diagnosis not present

## 2018-11-20 DIAGNOSIS — N183 Chronic kidney disease, stage 3 (moderate): Secondary | ICD-10-CM | POA: Diagnosis not present

## 2018-11-20 DIAGNOSIS — K922 Gastrointestinal hemorrhage, unspecified: Secondary | ICD-10-CM

## 2018-11-20 DIAGNOSIS — K921 Melena: Secondary | ICD-10-CM | POA: Diagnosis not present

## 2018-11-20 DIAGNOSIS — Z8673 Personal history of transient ischemic attack (TIA), and cerebral infarction without residual deficits: Secondary | ICD-10-CM | POA: Diagnosis not present

## 2018-11-20 DIAGNOSIS — K2951 Unspecified chronic gastritis with bleeding: Secondary | ICD-10-CM | POA: Diagnosis not present

## 2018-11-20 DIAGNOSIS — Z7901 Long term (current) use of anticoagulants: Secondary | ICD-10-CM | POA: Diagnosis not present

## 2018-11-20 DIAGNOSIS — F411 Generalized anxiety disorder: Secondary | ICD-10-CM | POA: Diagnosis not present

## 2018-11-20 DIAGNOSIS — M810 Age-related osteoporosis without current pathological fracture: Secondary | ICD-10-CM | POA: Diagnosis not present

## 2018-11-20 DIAGNOSIS — F329 Major depressive disorder, single episode, unspecified: Secondary | ICD-10-CM | POA: Diagnosis not present

## 2018-11-20 DIAGNOSIS — K449 Diaphragmatic hernia without obstruction or gangrene: Secondary | ICD-10-CM | POA: Diagnosis present

## 2018-11-20 DIAGNOSIS — N170 Acute kidney failure with tubular necrosis: Secondary | ICD-10-CM | POA: Diagnosis not present

## 2018-11-20 DIAGNOSIS — K209 Esophagitis, unspecified: Secondary | ICD-10-CM | POA: Diagnosis present

## 2018-11-20 DIAGNOSIS — I959 Hypotension, unspecified: Secondary | ICD-10-CM | POA: Diagnosis present

## 2018-11-20 DIAGNOSIS — J45909 Unspecified asthma, uncomplicated: Secondary | ICD-10-CM | POA: Diagnosis present

## 2018-11-20 DIAGNOSIS — F17220 Nicotine dependence, chewing tobacco, uncomplicated: Secondary | ICD-10-CM | POA: Diagnosis present

## 2018-11-20 DIAGNOSIS — D62 Acute posthemorrhagic anemia: Secondary | ICD-10-CM | POA: Diagnosis not present

## 2018-11-20 DIAGNOSIS — R55 Syncope and collapse: Secondary | ICD-10-CM | POA: Diagnosis not present

## 2018-11-20 DIAGNOSIS — Z79899 Other long term (current) drug therapy: Secondary | ICD-10-CM | POA: Diagnosis not present

## 2018-11-20 DIAGNOSIS — E785 Hyperlipidemia, unspecified: Secondary | ICD-10-CM | POA: Diagnosis not present

## 2018-11-20 DIAGNOSIS — J329 Chronic sinusitis, unspecified: Secondary | ICD-10-CM | POA: Diagnosis present

## 2018-11-20 DIAGNOSIS — I129 Hypertensive chronic kidney disease with stage 1 through stage 4 chronic kidney disease, or unspecified chronic kidney disease: Secondary | ICD-10-CM | POA: Diagnosis not present

## 2018-11-20 HISTORY — PX: ESOPHAGOGASTRODUODENOSCOPY (EGD) WITH PROPOFOL: SHX5813

## 2018-11-20 HISTORY — PX: BIOPSY: SHX5522

## 2018-11-20 LAB — BPAM RBC
Blood Product Expiration Date: 202004242359
Blood Product Expiration Date: 202005092359
Blood Product Expiration Date: 202005102359
Blood Product Expiration Date: 202005132359
ISSUE DATE / TIME: 202004210312
ISSUE DATE / TIME: 202004210332
ISSUE DATE / TIME: 202004210935
ISSUE DATE / TIME: 202004211341
Unit Type and Rh: 7300
Unit Type and Rh: 7300
Unit Type and Rh: 7300
Unit Type and Rh: 7300

## 2018-11-20 LAB — CBC
HCT: 29.6 % — ABNORMAL LOW (ref 36.0–46.0)
Hemoglobin: 9.1 g/dL — ABNORMAL LOW (ref 12.0–15.0)
MCH: 26.1 pg (ref 26.0–34.0)
MCHC: 30.7 g/dL (ref 30.0–36.0)
MCV: 84.8 fL (ref 80.0–100.0)
Platelets: 195 10*3/uL (ref 150–400)
RBC: 3.49 MIL/uL — ABNORMAL LOW (ref 3.87–5.11)
RDW: 15.8 % — ABNORMAL HIGH (ref 11.5–15.5)
WBC: 7 10*3/uL (ref 4.0–10.5)
nRBC: 0 % (ref 0.0–0.2)

## 2018-11-20 LAB — BASIC METABOLIC PANEL
Anion gap: 8 (ref 5–15)
BUN: 13 mg/dL (ref 8–23)
CO2: 19 mmol/L — ABNORMAL LOW (ref 22–32)
Calcium: 9 mg/dL (ref 8.9–10.3)
Chloride: 114 mmol/L — ABNORMAL HIGH (ref 98–111)
Creatinine, Ser: 1.1 mg/dL — ABNORMAL HIGH (ref 0.44–1.00)
GFR calc Af Amer: 58 mL/min — ABNORMAL LOW (ref >60)
GFR calc non Af Amer: 50 mL/min — ABNORMAL LOW (ref >60)
Glucose, Bld: 85 mg/dL (ref 70–99)
Potassium: 4 mmol/L (ref 3.5–5.1)
Sodium: 141 mmol/L (ref 135–145)

## 2018-11-20 LAB — TYPE AND SCREEN
ABO/RH(D): B POS
Antibody Screen: NEGATIVE
Unit division: 0
Unit division: 0
Unit division: 0
Unit division: 0

## 2018-11-20 LAB — MAGNESIUM: Magnesium: 2 mg/dL (ref 1.7–2.4)

## 2018-11-20 SURGERY — ESOPHAGOGASTRODUODENOSCOPY (EGD) WITH PROPOFOL
Anesthesia: Monitor Anesthesia Care

## 2018-11-20 SURGERY — ESOPHAGOGASTRODUODENOSCOPY (EGD) WITH PROPOFOL
Anesthesia: General

## 2018-11-20 MED ORDER — VERAPAMIL HCL ER 240 MG PO TBCR
240.0000 mg | EXTENDED_RELEASE_TABLET | Freq: Every day | ORAL | Status: DC
  Administered 2018-11-21: 240 mg via ORAL
  Filled 2018-11-20 (×2): qty 1

## 2018-11-20 MED ORDER — ONDANSETRON HCL 4 MG/2ML IJ SOLN
INTRAMUSCULAR | Status: DC | PRN
  Administered 2018-11-20: 4 mg via INTRAVENOUS

## 2018-11-20 MED ORDER — SUCCINYLCHOLINE CHLORIDE 200 MG/10ML IV SOSY
PREFILLED_SYRINGE | INTRAVENOUS | Status: DC | PRN
  Administered 2018-11-20: 100 mg via INTRAVENOUS

## 2018-11-20 MED ORDER — LACTATED RINGERS IV SOLN
INTRAVENOUS | Status: DC | PRN
  Administered 2018-11-20: 08:00:00 via INTRAVENOUS

## 2018-11-20 MED ORDER — ONDANSETRON HCL 4 MG/2ML IJ SOLN
INTRAMUSCULAR | Status: AC
  Filled 2018-11-20: qty 2

## 2018-11-20 MED ORDER — PANTOPRAZOLE SODIUM 40 MG PO TBEC
40.0000 mg | DELAYED_RELEASE_TABLET | Freq: Two times a day (BID) | ORAL | Status: DC
  Administered 2018-11-20 – 2018-11-21 (×3): 40 mg via ORAL
  Filled 2018-11-20 (×3): qty 1

## 2018-11-20 MED ORDER — PROPOFOL 10 MG/ML IV BOLUS
INTRAVENOUS | Status: AC
  Filled 2018-11-20: qty 40

## 2018-11-20 MED ORDER — ENSURE ENLIVE PO LIQD
237.0000 mL | Freq: Two times a day (BID) | ORAL | Status: DC
  Administered 2018-11-21 (×2): 237 mL via ORAL

## 2018-11-20 MED ORDER — SUCCINYLCHOLINE CHLORIDE 200 MG/10ML IV SOSY
PREFILLED_SYRINGE | INTRAVENOUS | Status: AC
  Filled 2018-11-20: qty 10

## 2018-11-20 MED ORDER — LIDOCAINE 2% (20 MG/ML) 5 ML SYRINGE
INTRAMUSCULAR | Status: AC
  Filled 2018-11-20: qty 5

## 2018-11-20 MED ORDER — LIDOCAINE 2% (20 MG/ML) 5 ML SYRINGE
INTRAMUSCULAR | Status: DC | PRN
  Administered 2018-11-20: 80 mg via INTRAVENOUS

## 2018-11-20 MED ORDER — PROPOFOL 10 MG/ML IV BOLUS
INTRAVENOUS | Status: DC | PRN
  Administered 2018-11-20: 120 mg via INTRAVENOUS

## 2018-11-20 MED ORDER — ROCURONIUM BROMIDE 10 MG/ML (PF) SYRINGE
PREFILLED_SYRINGE | INTRAVENOUS | Status: AC
  Filled 2018-11-20: qty 10

## 2018-11-20 SURGICAL SUPPLY — 15 items

## 2018-11-20 NOTE — Progress Notes (Signed)
Sitter called nursing station to report patient removed PIV tubing,  when entering room patient was ambulating in room with PIV dripping blood on floor, patient refused to allow staff remove bloody clothing or tubing to stop bleeding. Patient's daughter [name] called and was able to calm patient and patient accepted  prn xanax. Patient bedding and hospital gown changed. Sitter switch out with another staff for the remainder of the shift. Patient lying in bed talking with sitter at bedside.

## 2018-11-20 NOTE — Telephone Encounter (Signed)
===  View-only below this line=== ----- Message ----- From: Lavena Bullion, DO Sent: 11/20/2018   8:46 AM EDT To: Oda Kilts, CMA Subject: Hospital f/u                                   Can you please schedule a hospital f/u appt with me in approx 3 weeks. Also, please have patient got to lab for hemoglobin/Hematocrit in approx 1 week. She will likely be discharged later today or tomorrow. Best to coordinate through her daughter (number in chart) given patient's dementia. Thanks.

## 2018-11-20 NOTE — Anesthesia Postprocedure Evaluation (Signed)
Anesthesia Post Note  Patient: Stacy Brennan  Procedure(s) Performed: ESOPHAGOGASTRODUODENOSCOPY (EGD) WITH PROPOFOL (N/A ) BIOPSY     Patient location during evaluation: PACU Anesthesia Type: General Level of consciousness: awake and alert Pain management: pain level controlled Vital Signs Assessment: post-procedure vital signs reviewed and stable Respiratory status: spontaneous breathing, nonlabored ventilation and respiratory function stable Cardiovascular status: blood pressure returned to baseline and stable Postop Assessment: no apparent nausea or vomiting Anesthetic complications: no    Last Vitals:  Vitals:   11/20/18 0845 11/20/18 0900  BP: (!) 163/78 (!) 147/70  Pulse: 88 93  Resp: 15 15  Temp:    SpO2: 100% 96%    Last Pain:  Vitals:   11/20/18 0845  TempSrc:   PainSc: Boykin E Brock

## 2018-11-20 NOTE — Interval H&P Note (Signed)
History and Physical Interval Note:  11/20/2018 7:59 AM  Stacy Brennan  has presented today for surgery, with the diagnosis of melena, anemia.  The various methods of treatment have been discussed with the patient and family. After consideration of risks, benefits and other options for treatment, the patient has consented to  Procedure(s): ESOPHAGOGASTRODUODENOSCOPY (EGD) WITH PROPOFOL (N/A) OR GENERAL ANESTHESIA as a surgical intervention.  The patient's history has been reviewed, patient examined, no change in status, stable for surgery.  I have reviewed the patient's chart and labs.  Questions were answered to the patient's satisfaction.     Dominic Pea Deontre Allsup

## 2018-11-20 NOTE — Anesthesia Procedure Notes (Signed)
Procedure Name: Intubation Date/Time: 11/20/2018 8:17 AM Performed by: Lavina Hamman, CRNA Pre-anesthesia Checklist: Patient identified, Emergency Drugs available, Suction available, Patient being monitored and Timeout performed Patient Re-evaluated:Patient Re-evaluated prior to induction Oxygen Delivery Method: Circle system utilized Preoxygenation: Pre-oxygenation with 100% oxygen Induction Type: IV induction, Rapid sequence and Cricoid Pressure applied Laryngoscope Size: Mac and 3 Grade View: Grade II Tube type: Oral Tube size: 7.0 mm Number of attempts: 1 Airway Equipment and Method: Stylet Placement Confirmation: ETT inserted through vocal cords under direct vision,  positive ETCO2,  CO2 detector and breath sounds checked- equal and bilateral Secured at: 22 cm Tube secured with: Tape Dental Injury: Teeth and Oropharynx as per pre-operative assessment  Comments: ATOI

## 2018-11-20 NOTE — Progress Notes (Signed)
PT Cancellation Note  Patient Details Name: Stacy Brennan MRN: [id number] DOB: [birth year]   Cancelled Treatment:     PT order received but eval deferred - pt very pleasant but insistent the she is not going anywhere without her clothes on.  Will re-attempt in [address] Pager [phone] Office [phone]    [place] 11/20/2018, 3:50 PM

## 2018-11-20 NOTE — Progress Notes (Addendum)
PROGRESS NOTE    Stacy Brennan  GYI:[id number] DOB: 14-Dec-1945 DOA: 11/19/2018 PCP: Dorothyann Peng, NP     Brief Narrative:  Stacy Mae Adamsis a 73 y.o.femalewith medical history significant ofhypertension,CKD 3, CVA, dementia,left upper extremity thrombosisinFebruary 2020 status post thromboembolectomy and currently on Xarelto presenting to the hospital for evaluation of syncope and GI bleed.Per EMS report, patient has baseline dementia with mild confusion. She wasfound to have 200 cc tarry stool around her and more in the toilet. Blood pressure 112/55 and heart rate 72 by EMS. Patient states she fell in the bathroom today. Denies any dizziness/lightheadedness, chest pain, or shortness of breath prior to the fall. She is not sure if she lost consciousness. Denies any injuries from the fall or pain anywhere. Denies any headache,neck pain, or back pain. Patient thinks her daughter caught her when she fell. Does report noticing dark stool and spots of blood in her stool last week but is not sure of the exact timeline. Denies any abdominal pain or hematemesis. Reports over-the-counter NSAID use almost daily. Denies any prior history of GI bleed. Denies any fevers, chills, sick contacts, recent travel, or exposure to any individual with confirmed COVID-19.  She was admitted for acute blood loss anemia with melena.  New events last 24 hours / Subjective: Underwent EGD this morning, found nonbleeding gastric and duodenal ulcers. Sitter at bedside.   Assessment & Plan:   Principal Problem:   GI bleed Active Problems:   Hypertension   Melena   Syncope, non cardiac   AKI (acute kidney injury) (HCC)   CKD (chronic kidney disease) stage 3, GFR 30-59 ml/min (HCC)   Acute post-hemorrhagic anemia   Duodenal ulcer   Gastric ulcer without hemorrhage or perforation   Esophageal stricture   Acute blood loss anemia with upper GI bleed -Patient presenting with dark tarry  stools.  History of recent left upper extremity DVT February 2020 currently on Xarelto.  Also reports daily NSAID use. -EGD 4/22: found nonbleeding gastric ulcer, nonbleeding duodenal ulcers -Full liquid diet today, Protonix 40 mg twice daily for 8 weeks, Xarelto resumed tomorrow, follow-up with GI in 3 weeks, consider repeat EGD in 6 to 8 weeks to evaluate for mucosal healing and for repeat esophageal dilatation as indicated  Syncopal versus presyncopal episode -Etiology likely secondary to acute blood loss anemia -PT eval pending   Hypotension -History of essential hypertension. BP on admission 68/54.  Etiology likely from blood loss anemia as above.  Home antihypertensive regimen includes labetalol 200 mg p.o. twice daily, spironolactone 25 mg p.o. daily, and verapamil 240 mg p.o. nightly. Resume slowly   Acute renal failure on CKD stage III -Creatinine 1.85 on admission.  Baseline 1.3-1.4.  Etiology likely ATN from hypotension and blood loss anemia. Improving   History of left upper extremity DVT -Admitted from August 29, 2018 toFebruary 2, 2020 for acute left upper extremity ischemia. She was found to have acute thrombosis of left upper extremity and underwent exposure of left brachial artery with left arm thromboembolectomy. Currently on Xarelto for anticoagulation, resume tomorrow   HLD -Continue lipitor   DVT prophylaxis: SCD Code Status: Full Family Communication: Spoke with daughter over the phone  Disposition Plan: Trend CBC, resume Xarelto in the morning.  Likely discharge 4/23 as long as hemoglobin remains stable. PT Eval pending   Will switch to inpatient. Discussed this afternoon with patient's RN. Patient has been sleeping most of the day after EGD. Has been spontaneous in getting out of bed,  somewhat confused, does have underlying dementia although unclear what her true baseline function is. She is currently requiring a Actuary. Has not tried diet yet after procedure  due to sleeping all morning. Has not yet worked with PT yet. Unclear and unsafe discharge planning due to unclear baseline mentation.    Consultants:   GI  Procedures:   EGD 4/22  Antimicrobials:  Anti-infectives (From admission, onward)   None       Objective: Vitals:   11/20/18 0747 11/20/18 0840 11/20/18 0845 11/20/18 0900  BP: (!) 189/78 (!) 162/63 (!) 163/78 (!) 147/70  Pulse: 88 86 88 93  Resp: 14 13 15 15   Temp: 97.7 F (36.5 C) 97.7 F (36.5 C)    TempSrc: Oral     SpO2: 100% 100% 100% 96%  Weight:      Height:        Intake/Output Summary (Last 24 hours) at 11/20/2018 1220 Last data filed at 11/20/2018 0826 Gross per 24 hour  Intake 1518.72 ml  Output --  Net 1518.72 ml   Filed Weights   11/19/18 0137  Weight: 68 kg    Examination:  General exam: Appears calm and comfortable  Respiratory system: Clear to auscultation. Respiratory effort normal. Cardiovascular system: S1 & S2 heard, RRR. No JVD, murmurs, rubs, gallops or clicks. No pedal edema. Gastrointestinal system: Abdomen is nondistended, soft and nontender. No organomegaly or masses felt. Normal bowel sounds heard. Central nervous system: Alert. No focal neurological deficits. Extremities: Symmetric 5 x 5 power. Skin: No rashes, lesions or ulcers Psychiatry: Judgement and insight appear stable   Data Reviewed: I have personally reviewed following labs and imaging studies  CBC: Recent Labs  Lab 11/19/18 0144 11/19/18 1955 11/20/18 1027  WBC 8.6  --  7.0  NEUTROABS 5.8  --   --   HGB 5.7* 9.1* 9.1*  HCT 19.8* 29.2* 29.6*  MCV 84.3  --  84.8  PLT 233  --  967   Basic Metabolic Panel: Recent Labs  Lab 11/19/18 0144 11/20/18 1027  NA 137 141  K 3.9 4.0  CL 108 114*  CO2 20* 19*  GLUCOSE 100* 85  BUN 30* 13  CREATININE 1.85* 1.10*  CALCIUM 9.0 9.0  MG  --  2.0   GFR: Estimated Creatinine Clearance: 41 mL/min (A) (by C-G formula based on SCr of 1.1 mg/dL (H)). Liver Function  Tests: Recent Labs  Lab 11/19/18 0144  AST 16  ALT 11  ALKPHOS 52  BILITOT 0.3  PROT 6.4*  ALBUMIN 3.8   No results for input(s): LIPASE, AMYLASE in the last 168 hours. No results for input(s): AMMONIA in the last 168 hours. Coagulation Profile: No results for input(s): INR, PROTIME in the last 168 hours. Cardiac Enzymes: No results for input(s): CKTOTAL, CKMB, CKMBINDEX, TROPONINI in the last 168 hours. BNP (last 3 results) No results for input(s): PROBNP in the last 8760 hours. HbA1C: No results for input(s): HGBA1C in the last 72 hours. CBG: No results for input(s): GLUCAP in the last 168 hours. Lipid Profile: No results for input(s): CHOL, HDL, LDLCALC, TRIG, CHOLHDL, LDLDIRECT in the last 72 hours. Thyroid Function Tests: No results for input(s): TSH, T4TOTAL, FREET4, T3FREE, THYROIDAB in the last 72 hours. Anemia Panel: No results for input(s): VITAMINB12, FOLATE, FERRITIN, TIBC, IRON, RETICCTPCT in the last 72 hours. Sepsis Labs: No results for input(s): PROCALCITON, LATICACIDVEN in the last 168 hours.  No results found for this or any previous visit (from the past  240 hour(s)).     Radiology Studies: No results found.    Scheduled Meds:  atorvastatin  20 mg Oral Daily   brimonidine  1 drop Both Eyes BID   dorzolamide-timolol  1 drop Both Eyes BID   labetalol  200 mg Oral BID   latanoprost  1 drop Both Eyes QHS   Netarsudil Dimesylate  1 drop Both Eyes Daily   pantoprazole  40 mg Oral BID   verapamil  240 mg Oral Daily   Continuous Infusions:   LOS: 0 days    Time spent: 35 minutes   Dessa Phi, DO [facility] [email or web address] 11/20/2018, 12:20 PM

## 2018-11-20 NOTE — Op Note (Signed)
[facility] Patient Name: Stacy Brennan Procedure Date: 11/20/2018 MRN: [id number] Attending MD: Gerrit Heck , MD Date of Birth: 1945/12/05 CSN: [id number] Age: 73 Admit Type: Inpatient Procedure:                Upper GI endoscopy Indications:              Acute post hemorrhagic anemia, Melena Providers:                Gerrit Heck, MD, Burtis Junes, RN, Tinnie Gens,                            Technician, Arnoldo Hooker, CRNA Referring MD:              Medicines:                General Anesthesia Complications:            No immediate complications. Estimated Blood Loss:     Estimated blood loss was minimal. Procedure:                Pre-Anesthesia Assessment:                           - Prior to the procedure, a History and Physical                            was performed, and patient medications and                            allergies were reviewed. The patient's tolerance of                            previous anesthesia was also reviewed. The risks                            and benefits of the procedure and the sedation                            options and risks were discussed with the patient.                            All questions were answered, and informed consent                            was obtained. Prior Anticoagulants: The patient has                            taken Xarelto (rivaroxaban), last dose was 2 days                            prior to procedure. ASA Grade Assessment: III - A                            patient with severe systemic disease. After  reviewing the risks and benefits, the patient was                            deemed in satisfactory condition to undergo the                            procedure.                           After obtaining informed consent, the endoscope was                            passed under direct vision. Throughout the                            procedure, the patient's blood  pressure, pulse, and                            oxygen saturations were monitored continuously. The                            GIF-H190 ([id number]) Olympus gastroscope was                            introduced through the mouth, and advanced to the                            second part of duodenum. The upper GI endoscopy was                            accomplished without difficulty. The patient                            tolerated the procedure well. Scope In: Scope Out: Findings:      LA Grade A (one or more mucosal breaks less than 5 mm, not extending       between tops of 2 mucosal folds) esophagitis with no bleeding was found       40 cm from the incisors. The mucosa at the GEJ was milldy edematous and       erythematous. Biopsies were taken with a cold forceps for histology.       Estimated blood loss was minimal.      One benign-appearing, intrinsic moderate stenosis was found 40 cm from       the incisors. This stenosis measured less than one cm (in length). The       stenosis was traversed. There were 2 small mucosal rents noted       consistent with dilation of the stenosis with the endoscope alone. No       further dilation was pursued. The area was biopsied as above.      The upper third of the esophagus and middle third of the esophagus were       normal.      A 2 cm sliding type hiatal hernia was present.      The gastroesophageal flap valve was visualized endoscopically and       classified  as Hill Grade III (minimal fold, loose to endoscope, hiatal       hernia likely).      The gastric fundus, gastric body and incisura were normal.      One non-bleeding superficial gastric ulcer with no stigmata of bleeding       was found in the gastric antrum. The lesion was 3 mm in largest       dimension. Biopsies were taken with a cold forceps for Helicobacter       pylori testing. Estimated blood loss was minimal. There was mild luminal       distortion in the pre-pyloric antrum  suggestive of a history of peptic       ulcer disease.      Three non-bleeding cratered duodenal ulcers with no stigmata of bleeding       were found in the duodenal bulb. The largest lesion was 5 mm in largest       dimension. Biopsies were taken with a cold forceps for histology.       Estimated blood loss was minimal.      The second portion of the duodenum was normal. Impression:               - LA Grade A esophagitis. Biopsied.                           - Benign-appearing esophageal stenosis. This was                            dilated with passage of the endoscope alone.                           - Normal upper third of esophagus and middle third                            of esophagus.                           - 2 cm hiatal hernia.                           - Gastroesophageal flap valve classified as Hill                            Grade III (minimal fold, loose to endoscope, hiatal                            hernia likely).                           - Normal gastric fundus, gastric body and incisura.                           - Non-bleeding gastric ulcer with no stigmata of                            bleeding. Biopsied.                           -  Non-bleeding duodenal ulcers with no stigmata of                            bleeding. Biopsied.                           - Normal second portion of the duodenum. Moderate Sedation:      Not Applicable - Patient had care per Anesthesia. Recommendation:           - Return patient to hospital ward for ongoing care                            with possible discharge later today or tomorrow.                           - Full liquid diet today.                           - Continue present medications.                           - Use Protonix (pantoprazole) 40 mg PO BID for 8                            weeks.                           - Resume Xarelto (rivaroxaban) at prior dose                            tomorrow.                            - Return to GI clinic in 3 weeks.                           - Repeat Hemoglobin/Hematocrit 1 week after                            hosptial discharge.                           - May consider repeat EGD in 6-8 weeks to evaluate                            for mucosal healing and for repeat esophageal                            dilation as indicated. Plan to discuss this further                            at follow-up appointment. Procedure Code(s):        --- Professional ---                           [phone], Esophagogastroduodenoscopy, flexible,  transoral; with biopsy, single or multiple Diagnosis Code(s):        --- Professional ---                           K20.9, Esophagitis, unspecified                           K22.2, Esophageal obstruction                           K44.9, Diaphragmatic hernia without obstruction or                            gangrene                           K25.9, Gastric ulcer, unspecified as acute or                            chronic, without hemorrhage or perforation                           K26.9, Duodenal ulcer, unspecified as acute or                            chronic, without hemorrhage or perforation                           D62, Acute posthemorrhagic anemia                           K92.1, Melena (includes Hematochezia) CPT copyright 2019 [employer]. All rights reserved. The codes documented in this report are preliminary and upon coder review may  be revised to meet current compliance requirements. Gerrit Heck, MD 11/20/2018 8:48:04 AM Number of Addenda: 0

## 2018-11-20 NOTE — Transfer of Care (Signed)
Immediate Anesthesia Transfer of Care Note  Patient: Stacy Brennan  Procedure(s) Performed: Procedure(s): ESOPHAGOGASTRODUODENOSCOPY (EGD) WITH PROPOFOL (N/A) BIOPSY  Patient Location: PACU  Anesthesia Type:General  Level of Consciousness:  sedated, patient cooperative and responds to stimulation  Airway & Oxygen Therapy:Patient Spontanous Breathing and Patient connected to face mask oxgen  Post-op Assessment:  Report given to PACU RN and Post -op Vital signs reviewed and stable  Post vital signs:  Reviewed and stable  Last Vitals:  Vitals:   11/20/18 0620 11/20/18 0747  BP: (!) 173/77 (!) 189/78  Pulse: 88 88  Resp: 15 14  Temp: (!) 36.4 C 36.5 C  SpO2: 282% 417%    Complications: No apparent anesthesia complications

## 2018-11-20 NOTE — Progress Notes (Signed)
KIM called (patient's daughter,& POA to check status of patient condition, & inquiry when someone would to talk to her about her mother's procedure, she had spoken to the day shift nurse Josph Macho who said the MD  would called this evening.

## 2018-11-20 NOTE — Telephone Encounter (Signed)
Lab orders are in for labs to be drawn in 1 week at [facility] since the patient lives in [place]   Patients follow up with Dr Bryan Lemma is scheduled on 12/10/2018 at 1:45pm

## 2018-11-21 ENCOUNTER — Encounter (HOSPITAL_COMMUNITY): Payer: Self-pay | Admitting: Gastroenterology

## 2018-11-21 DIAGNOSIS — Z7901 Long term (current) use of anticoagulants: Secondary | ICD-10-CM

## 2018-11-21 LAB — BASIC METABOLIC PANEL
Anion gap: 8 (ref 5–15)
BUN: 15 mg/dL (ref 8–23)
CO2: 22 mmol/L (ref 22–32)
Calcium: 9.3 mg/dL (ref 8.9–10.3)
Chloride: 112 mmol/L — ABNORMAL HIGH (ref 98–111)
Creatinine, Ser: 1.23 mg/dL — ABNORMAL HIGH (ref 0.44–1.00)
GFR calc Af Amer: 50 mL/min — ABNORMAL LOW (ref >60)
GFR calc non Af Amer: 43 mL/min — ABNORMAL LOW (ref >60)
Glucose, Bld: 79 mg/dL (ref 70–99)
Potassium: 3.8 mmol/L (ref 3.5–5.1)
Sodium: 142 mmol/L (ref 135–145)

## 2018-11-21 LAB — CBC
HCT: 28.1 % — ABNORMAL LOW (ref 36.0–46.0)
Hemoglobin: 8.4 g/dL — ABNORMAL LOW (ref 12.0–15.0)
MCH: 25.4 pg — ABNORMAL LOW (ref 26.0–34.0)
MCHC: 29.9 g/dL — ABNORMAL LOW (ref 30.0–36.0)
MCV: 84.9 fL (ref 80.0–100.0)
Platelets: 217 10*3/uL (ref 150–400)
RBC: 3.31 MIL/uL — ABNORMAL LOW (ref 3.87–5.11)
RDW: 15.9 % — ABNORMAL HIGH (ref 11.5–15.5)
WBC: 7.7 10*3/uL (ref 4.0–10.5)
nRBC: 0 % (ref 0.0–0.2)

## 2018-11-21 MED ORDER — SPIRONOLACTONE 25 MG PO TABS
25.0000 mg | ORAL_TABLET | Freq: Every day | ORAL | Status: DC
  Administered 2018-11-21: 25 mg via ORAL
  Filled 2018-11-21: qty 1

## 2018-11-21 MED ORDER — PANTOPRAZOLE SODIUM 40 MG PO TBEC: 40.0000 mg | DELAYED_RELEASE_TABLET | Freq: Two times a day (BID) | ORAL | 1 refills | Status: DC

## 2018-11-21 MED ORDER — RIVAROXABAN 20 MG PO TABS: 20.0000 mg | ORAL_TABLET | Freq: Every day | ORAL | Status: DC

## 2018-11-21 NOTE — Evaluation (Signed)
Occupational Therapy Evaluation Patient Details Name: Stacy Brennan MRN: [id number] DOB: 1945/11/10 Today's Date: 11/21/2018    History of Present Illness This 73 year old female was admitted for syncope and GIB.  PMH:  dementia, CVA x 2, HTN, CKD and LUE DVT   Clinical Impression   Pt was admitted for the above. She is near baseline:  Min guard given for safety when walking to bathroom.  Some of this may be due to decreased vision.  Recommend initial 24/7 at home    Follow Up Recommendations  Supervision/Assistance - 24 hour    Equipment Recommendations  None recommended by OT    Recommendations for Other Services       Precautions / Restrictions Precautions Precautions: Fall Precaution Comments: Pt with limited vision - does better with bright lighting Restrictions Weight Bearing Restrictions: No      Mobility Bed Mobility Overal bed mobility: Modified Independent             General bed mobility comments: Pt unassisted from bed  Transfers Overall transfer level: Modified independent Equipment used: None             General transfer comment: pt stood unassisted from bedside    Balance Overall balance assessment: Needs assistance Sitting-balance support: Feet supported;No upper extremity supported Sitting balance-Leahy Scale: Good     Standing balance support: No upper extremity supported Standing balance-Leahy Scale: Good                             ADL either performed or assessed with clinical judgement   ADL Overall ADL's : Needs assistance/impaired Eating/Feeding: Independent   Grooming: Oral care;Standing;Supervision/safety;Set up   Upper Body Bathing: Set up   Lower Body Bathing: Supervison/ safety;Sit to/from stand   Upper Body Dressing : Set up   Lower Body Dressing: Set up;Supervision/safety   Toilet Transfer: Min guard;Ambulation(bed, to bathroom to bed)   Toileting- Clothing Manipulation and Hygiene:  Supervision/safety         General ADL Comments: pt tends to hold to furniture when walking to bathroom.  Asked for more light in bathroom stating that she couldn't see well.  Glasses were on     Vision         Perception     Praxis      Pertinent Vitals/Pain Pain Assessment: No/denies pain     Hand Dominance     Extremity/Trunk Assessment Upper Extremity Assessment Upper Extremity Assessment: Overall WFL for tasks assessed   Lower Extremity Assessment Lower Extremity Assessment: Overall WFL for tasks assessed       Communication Communication Communication: No difficulties   Cognition Arousal/Alertness: Awake/alert Behavior During Therapy: Impulsive Overall Cognitive Status: History of cognitive impairments - at baseline                                 General Comments: PMH of dementia; tends to get stuck on an idea; decreased STM   General Comments       Exercises     Shoulder Instructions      Home Living Family/patient expects to be discharged to:: Private residence Living Arrangements: Children Available Help at Discharge: Family Type of Home: House Home Access: Level entry           Bathroom Shower/Tub: Teaching laboratory technician Toilet: Handicapped height     Home Equipment: Museum/gallery curator  bars - tub/shower          Prior Functioning/Environment Level of Independence: Independent        Comments: Pt reports no assistive device for ambulation        OT Problem List:        OT Treatment/Interventions:      OT Goals(Current goals can be found in the care plan section) Acute Rehab OT Goals Patient Stated Goal: Find my clothes and put them on OT Goal Formulation: All assessment and education complete, DC therapy  OT Frequency:     Barriers to D/C:            Co-evaluation              AM-PAC OT "6 Clicks" Daily Activity     Outcome Measure                 End of Session    Activity Tolerance:  Patient tolerated treatment well Patient left: in bed;with call bell/phone within reach;with nursing/sitter in room                   Time: 1024-1047 OT Time Calculation (min): 23 min Charges:  OT General Charges $OT Visit: 1 Visit OT Evaluation $OT Eval Low Complexity: [place], OTR/L Acute Rehabilitation Services [phone] [facility] pager [phone] office 11/21/2018  [place] 11/21/2018, 12:51 PM

## 2018-11-21 NOTE — Progress Notes (Signed)
[facility] ROUNDING NOTE   Subjective: No acute events overnight.  EGD completed yesterday notable for 3 duodenal and 1 gastric, nonbleeding ulcers.  No endoscopic intervention indicated.  Otherwise did well overnight, without any further melena.  Denies any abdominal pain.  Tolerating p.o. intake.  Hemoglobin 8.4 this morning with BUN 15.   Objective: Vital signs in last 24 hours: Temp:  [98 F (36.7 C)-98.8 F (37.1 C)] 98.8 F (37.1 C) (04/23 0608) Pulse Rate:  [75-86] 86 (04/23 0608) Resp:  [18-20] 20 (04/23 0608) BP: (153-184)/(59-80) 153/59 (04/23 0608) SpO2:  [95 %-100 %] 98 % (04/23 0608) Weight:  [70 kg] 70 kg (04/23 0608) Last BM Date: 11/18/18 General: NAD Abdomen: Soft, nondistended   Intake/Output from previous day: 04/22 0701 - 04/23 0700 In: 680 [P.O.:480; I.V.:200] Out: -  Intake/Output this shift: No intake/output data recorded.   Lab Results: Recent Labs    11/19/18 0144 11/19/18 1955 11/20/18 1027 11/21/18 0606  WBC 8.6  --  7.0 7.7  HGB 5.7* 9.1* 9.1* 8.4*  PLT 233  --  195 217  MCV 84.3  --  84.8 84.9   BMET Recent Labs    11/19/18 0144 11/20/18 1027 11/21/18 0606  NA 137 141 142  K 3.9 4.0 3.8  CL 108 114* 112*  CO2 20* 19* 22  GLUCOSE 100* 85 79  BUN 30* 13 15  CREATININE 1.85* 1.10* 1.23*  CALCIUM 9.0 9.0 9.3   LFT Recent Labs    11/19/18 0144  PROT 6.4*  ALBUMIN 3.8  AST 16  ALT 11  ALKPHOS 52  BILITOT 0.3   PT/INR No results for input(s): INR in the last 72 hours.    Imaging/Other results: No results found.    Assessment and Recommendations:  1) Peptic ulcer disease 2) Acute posthemorrhagic anemia 3) Chronic anticoagulation 4) History of LUE thrombosis 5) Melena-resolved 6) Esophagitis 7) Esophageal stenosis 8) Hiatal hernia  73 year old female on Xarelto for LUE thrombosis earlier this year, presents with melena and presyncopal event.  Transfused 3 units PRBCs for hemoglobin nadir 5.7  with an appropriate response.  Hemoglobin overall stable at 8.4 today without any further evidence of bleeding.  EGD yesterday notable for 3 proximal duodenal ulcers and one distal gastric ulcer, all nonbleeding and superficial.  No high risk stigmata of bleeding requiring endoscopic intervention.  Biopsies obtained and currently pending.  EGD also notable for a 2 cm hiatal hernia with lower esophageal esophagitis and a presumed peptic stricture, which was dilated with the endoscope alone.  Otherwise doing well clinically today and hoping to discharge from the hospital.  -Resume Protonix 40 mg p.o. twice daily for at least 8 weeks, then resuming 40 mg daily dose control of reflux and noted peptic stricture esophagitis - Tolerating full liquid diet.  Advance to soft foods as tolerated tomorrow -Okay to resume Xarelto - Repeat hemoglobin/hematocrit 1 week post hospital discharge - Likely plan for repeat EGD in 6 to 8 weeks to both evaluate for mucosal healing along with repeat esophageal dilation of the noted peptic stricture - Will set up follow-up appointment in the GI clinic in 3 weeks -Stop NSAIDs.  Do not resume ibuprofen or other NSAIDs following hospital discharge - GI service will sign off.  Please not hesitate to contact additional questions or concerns.    Lavena Bullion, DO  11/21/2018, 11:24 AM [facility] Pager [phone]

## 2018-11-21 NOTE — Telephone Encounter (Signed)
Spoke with daughter and she is ok to have telephone meeting appointment with Dr Bryan Lemma on 12/10/2018   And will go to [place] for labs

## 2018-11-21 NOTE — Discharge Summary (Signed)
Physician Discharge Summary  Stacy Mae Radliff HYQ:[id number] DOB: 09-29-1945 DOA: 11/19/2018  PCP: Dorothyann Peng, NP  Admit date: 11/19/2018 Discharge date: 11/21/2018  Admitted From: Home Disposition:  Home   Recommendations for Outpatient Follow-up:  1. Follow up with PCP in 1 week  2. Follow up with Dr. Bryan Lemma in 3 weeks  3. Repeat hemoglobin/hematocrit 1 week post hospital discharge  4. Please follow up on the following pending results: biopsy from EGD  5. Stop NSAIDs. Do not resume ibuprofen or other NSAIDs   Discharge Condition: Stable CODE STATUS: Full  Diet recommendation: Advance diet as tolerated   Brief/Interim Summary: Stacy Mae Adamsis a 73 y.o.femalewith medical history significant ofhypertension,CKD 3, CVA, dementia,left upper extremity thrombosisinFebruary 2020 status post thromboembolectomy and currently on Xarelto presenting to the hospital for evaluation of syncope and GI bleed.Per EMS report, patient has baseline dementia with mild confusion. She wasfound to have 200 cc tarry stool around her and more in the toilet. Blood pressure 112/55 and heart rate 72 by EMS. Patient states she fell in the bathroom today. Denies any dizziness/lightheadedness, chest pain, or shortness of breath prior to the fall. She is not sure if she lost consciousness. Denies any injuries from the fall or pain anywhere. Denies any headache,neck pain, or back pain. Patient thinks her daughter caught her when she fell. Does report noticing dark stool and spots of blood in her stool last week but is not sure of the exact timeline. Denies any abdominal pain or hematemesis. Reports over-the-counter NSAID use almost daily. Denies any prior history of GI bleed. Denies any fevers, chills, sick contacts, recent travel, or exposure to any individual with confirmed COVID-19.  She was admitted for acute blood loss anemia with melena. EGD 4/22: found nonbleeding gastric ulcer,  nonbleeding duodenal ulcers. She remained stable in terms of further GI blood loss, vital signs, Hgb.   Discharge Diagnoses:  Principal Problem:   GI bleed Active Problems:   Hypertension   Melena   Syncope, non cardiac   AKI (acute kidney injury) ([place])   CKD (chronic kidney disease) stage 3, GFR 30-59 ml/min ([facility])   Acute blood loss anemia   Duodenal ulcer   Gastric ulcer without hemorrhage or perforation   Esophageal stricture   Chronic anticoagulation   Acute blood loss anemia with upper GI bleed -Patient presenting with dark tarry stools. History of recent left upper extremity DVT February 2020 currently on Xarelto. Also reports daily NSAID use. -EGD 4/22: found nonbleeding gastric ulcer, nonbleeding duodenal ulcers -Full liquid diet today, Protonix 40 mg twice daily for 8 weeks, Xarelto resumed 4/23, follow-up with GI in 3 weeks, consider repeat EGD in 6 to 8 weeks to evaluate for mucosal healing and for repeat esophageal dilatation as indicated  Syncopal versus presyncopal episode -Etiology likely secondary to acute blood loss anemia -PT evaluated patient, back to her baseline at this point   Hypotension -History ofessential hypertension. BP on admission 68/54. Etiology likely from blood loss anemia as above. Home antihypertensive regimen includes labetalol 200 mg p.o. twice daily, spironolactone 25 mg p.o. daily, and verapamil 240 mg p.o. nightly. Resume. BP stable and on the higher side now.   Acute renal failure on CKD stage III -Creatinine 1.85 on admission. Baseline 1.3-1.4.Etiology likely ATN from hypotension and blood loss anemia. Improved   History of left upper extremity DVT -Admitted from August 29, 2018 toFebruary 2, 2020 for acute left upper extremity ischemia. She was found to have acute thrombosis of left upper  extremity and underwent exposure of left brachial artery with left arm thromboembolectomy. Currently on Xarelto for anticoagulation,  resume  HLD -Continue lipitor    Discharge Instructions  Discharge Instructions    Call MD for:  difficulty breathing, headache or visual disturbances   Complete by:  As directed    Call MD for:  extreme fatigue   Complete by:  As directed    Call MD for:  persistant dizziness or light-headedness   Complete by:  As directed    Call MD for:  persistant nausea and vomiting   Complete by:  As directed    Call MD for:  severe uncontrolled pain   Complete by:  As directed    Call MD for:  temperature >100.4   Complete by:  As directed    Diet general   Complete by:  As directed    Discharge instructions   Complete by:  As directed    You were cared for by a hospitalist during your hospital stay. If you have any questions about your discharge medications or the care you received while you were in the hospital after you are discharged, you can call the unit and ask to speak with the hospitalist on call if the hospitalist that took care of you is not available. Once you are discharged, your primary care physician will handle any further medical issues. Please note that NO REFILLS for any discharge medications will be authorized once you are discharged, as it is imperative that you return to your primary care physician (or establish a relationship with a primary care physician if you do not have one) for your aftercare needs so that they can reassess your need for medications and monitor your lab values.   Increase activity slowly   Complete by:  As directed      Allergies as of 11/21/2018      Reactions   Warfarin Palpitations   Doxycycline Rash   Aricept [donepezil Hcl] Other (See Comments)   Insomnia and not eating       Medication List    STOP taking these medications   ibuprofen 200 MG tablet Commonly known as:  ADVIL     TAKE these medications   acetaminophen 500 MG tablet Commonly known as:  TYLENOL Take 1,000 mg by mouth every 6 (six) hours as needed for moderate  pain.   alendronate 70 MG tablet Commonly known as:  Fosamax Take 1 tablet (70 mg total) by mouth once a week. Take with a full glass of water on an empty stomach. What changed:  when to take this   ALPRAZolam 0.5 MG tablet Commonly known as:  Xanax Take 1 tablet (0.5 mg total) by mouth 2 (two) times daily. What changed:    when to take this  reasons to take this   atorvastatin 20 MG tablet Commonly known as:  LIPITOR Take 1 tablet (20 mg total) by mouth daily.   brimonidine 0.2 % ophthalmic solution Commonly known as:  ALPHAGAN Place 1 drop into both eyes 2 (two) times daily.   chlorhexidine 0.12 % solution Commonly known as:  PERIDEX Use as directed 15 mLs in the mouth or throat daily as needed. Gum Diease   diphenhydrAMINE 25 MG tablet Commonly known as:  BENADRYL Take 50 mg by mouth every 6 (six) hours as needed for allergies.   dorzolamide-timolol 22.3-6.8 MG/ML ophthalmic solution Commonly known as:  COSOPT Place 1 drop into both eyes 2 (two) times daily.   fluticasone  50 MCG/ACT nasal spray Commonly known as:  FLONASE Place 1 spray into both nostrils daily as needed for allergies or rhinitis.   labetalol 200 MG tablet Commonly known as:  NORMODYNE TAKE 1 TABLET BY MOUTH TWICE DAILY   methylcellulose oral powder Take 1 packet by mouth daily.   pantoprazole 40 MG tablet Commonly known as:  PROTONIX Take 1 tablet (40 mg total) by mouth 2 (two) times daily.   Rhopressa 0.02 % Soln Generic drug:  Netarsudil Dimesylate Place 1 drop into both eyes daily.   rivaroxaban 20 MG Tabs tablet Commonly known as:  Xarelto Take 1 tablet (20 mg total) by mouth daily with supper.   sennosides-docusate sodium 8.6-50 MG tablet Commonly known as:  SENOKOT-S Take 1 tablet by mouth daily.   Sinus and Headache 5-325 MG Tabs Generic drug:  Phenylephrine-Acetaminophen Take 2 tablets by mouth every 6 (six) hours as needed. Congestion and headache   spironolactone 25 MG  tablet Commonly known as:  ALDACTONE TAKE 1 TABLET (25 MG TOTAL) BY MOUTH EVERY MORNING. What changed:  See the new instructions.   Travoprost (BAK Free) 0.004 % Soln ophthalmic solution Commonly known as:  TRAVATAN Place 1 drop into both eyes at bedtime.   traZODone 50 MG tablet Commonly known as:  DESYREL Take 1.5 tablets (75 mg total) by mouth at bedtime as needed for sleep.   verapamil 240 MG CR tablet Commonly known as:  CALAN-SR TAKE 1 TABLET BY MOUTH AT BEDTIME What changed:  when to take this      Follow-up Information    Dorothyann Peng, NP. Schedule an appointment as soon as possible for a visit in 1 week(s).   Specialty:  Family Medicine Contact information: [place] [address] [place], [place], [place]. Go on 12/10/2018.   Specialty:  Gastroenterology Contact information: [address] [place] [address] [phone]          Allergies  Allergen Reactions  . Warfarin Palpitations  . Doxycycline Rash  . Aricept [Donepezil Hcl] Other (See Comments)    Insomnia and not eating     Consultations:  GI   Procedures/Studies: No results found.  EGD 4/22    Discharge Exam: Vitals:   11/20/18 2142 11/21/18 0608  BP: (!) 184/79 (!) 153/59  Pulse: 86 86  Resp: 18 20  Temp: 98.7 F (37.1 C) 98.8 F (37.1 C)  SpO2: 95% 98%    General: Pt is alert, awake, not in acute distress Cardiovascular: RRR, S1/S2 +, no rubs, no gallops Respiratory: CTA bilaterally, no wheezing, no rhonchi Abdominal: Soft, NT, ND, bowel sounds + Extremities: no edema, no cyanosis    The results of significant diagnostics from this hospitalization (including imaging, microbiology, ancillary and laboratory) are listed below for reference.     Microbiology: No results found for this or any previous visit (from the past 240 hour(s)).   Labs: BNP (last 3 results) No results for input(s): BNP in the last  8760 hours. Basic Metabolic Panel: Recent Labs  Lab 11/19/18 0144 11/20/18 1027 11/21/18 0606  NA 137 141 142  K 3.9 4.0 3.8  CL 108 114* 112*  CO2 20* 19* 22  GLUCOSE 100* 85 79  BUN 30* 13 15  CREATININE 1.85* 1.10* 1.23*  CALCIUM 9.0 9.0 9.3  MG  --  2.0  --    Liver Function Tests: Recent Labs  Lab 11/19/18 0144  AST 16  ALT 11  ALKPHOS 52  BILITOT 0.3  PROT 6.4*  ALBUMIN 3.8   No results for input(s): LIPASE, AMYLASE in the last 168 hours. No results for input(s): AMMONIA in the last 168 hours. CBC: Recent Labs  Lab 11/19/18 0144 11/19/18 1955 11/20/18 1027 11/21/18 0606  WBC 8.6  --  7.0 7.7  NEUTROABS 5.8  --   --   --   HGB 5.7* 9.1* 9.1* 8.4*  HCT 19.8* 29.2* 29.6* 28.1*  MCV 84.3  --  84.8 84.9  PLT 233  --  195 217   Cardiac Enzymes: No results for input(s): CKTOTAL, CKMB, CKMBINDEX, TROPONINI in the last 168 hours. BNP: Invalid input(s): POCBNP CBG: No results for input(s): GLUCAP in the last 168 hours. D-Dimer No results for input(s): DDIMER in the last 72 hours. Hgb A1c No results for input(s): HGBA1C in the last 72 hours. Lipid Profile No results for input(s): CHOL, HDL, LDLCALC, TRIG, CHOLHDL, LDLDIRECT in the last 72 hours. Thyroid function studies No results for input(s): TSH, T4TOTAL, T3FREE, THYROIDAB in the last 72 hours.  Invalid input(s): FREET3 Anemia work up No results for input(s): VITAMINB12, FOLATE, FERRITIN, TIBC, IRON, RETICCTPCT in the last 72 hours. Urinalysis No results found for: COLORURINE, APPEARANCEUR, LABSPEC, [place], GLUCOSEU, HGBUR, BILIRUBINUR, KETONESUR, PROTEINUR, UROBILINOGEN, NITRITE, LEUKOCYTESUR Sepsis Labs Invalid input(s): PROCALCITONIN,  WBC,  LACTICIDVEN Microbiology No results found for this or any previous visit (from the past 240 hour(s)).   Patient was seen and examined on the day of discharge and was found to be in stable condition. Time coordinating discharge: 25 minutes including  assessment and coordination of care, as well as examination of the patient.   SIGNED:  Dessa Phi, DO [facility] [email or web address] 11/21/2018, 12:35 PM

## 2018-11-21 NOTE — Evaluation (Signed)
Physical Therapy Evaluation Patient Details Name: Stacy Brennan MRN: [id number] DOB: [birth year] Today's Date: 11/21/2018   History of Present Illness  This 73 year old female was admitted for syncope and GIB.  PMH:  dementia, CVA x 2, HTN, CKD and LUE DVT  Clinical Impression  Pt admitted as above and presenting with functional mobility limitations 2* mild gait instability.  Pt self-limiting assessment based on focus on finding clothes and refusing to leave room without them on.  Pt should progress to dc home with family assist.      Follow Up Recommendations No PT follow up    Equipment Recommendations  None recommended by PT    Recommendations for Other Services       Precautions / Restrictions Precautions Precautions: Fall Precaution Comments: Pt with limited vision - does better with bright lighting Restrictions Weight Bearing Restrictions: No      Mobility  Bed Mobility Overal bed mobility: Modified Independent             General bed mobility comments: Pt unassisted from bed  Transfers Overall transfer level: Modified independent Equipment used: None             General transfer comment: pt stood unassisted from bedside  Ambulation/Gait Ambulation/Gait assistance: Min guard;Supervision Gait Distance (Feet): 40 Feet Assistive device: None Gait Pattern/deviations: Step-through pattern;Shuffle;Narrow base of support Gait velocity: decr   General Gait Details: mild instability and no LOB.  Distance ltd - pt refuses to leave room "without my clothes on"  Stairs            Wheelchair Mobility    Modified Rankin (Stroke Patients Only)       Balance Overall balance assessment: Needs assistance Sitting-balance support: Feet supported;No upper extremity supported Sitting balance-Leahy Scale: Good     Standing balance support: No upper extremity supported Standing balance-Leahy Scale: Good                                Pertinent Vitals/Pain Pain Assessment: No/denies pain    Home Living Family/patient expects to be discharged to:: Private residence Living Arrangements: Children Available Help at Discharge: Family Type of Home: House Home Access: Level entry       Home Equipment: Grab bars - tub/shower      Prior Function Level of Independence: Independent         Comments: Pt reports no assistive device for ambulation     Hand Dominance        Extremity/Trunk Assessment   Upper Extremity Assessment Upper Extremity Assessment: Overall WFL for tasks assessed    Lower Extremity Assessment Lower Extremity Assessment: Overall WFL for tasks assessed       Communication   Communication: No difficulties  Cognition Arousal/Alertness: Awake/alert Behavior During Therapy: Impulsive Overall Cognitive Status: History of cognitive impairments - at baseline                                 General Comments: PMH of dementia; tends to get stuck on an idea; decreased STM      General Comments      Exercises     Assessment/Plan    PT Assessment Patient needs continued PT services  PT Problem List Decreased activity tolerance;Decreased balance;Decreased mobility;Decreased safety awareness       PT Treatment Interventions DME instruction;Gait training;Stair training;Therapeutic exercise;Patient/family education  PT Goals (Current goals can be found in the Care Plan section)  Acute Rehab PT Goals Patient Stated Goal: Find my clothes and put them on PT Goal Formulation: With patient Time For Goal Achievement: 11/21/18 Potential to Achieve Goals: Good    Frequency Min 3X/week   Barriers to discharge        Co-evaluation               AM-PAC PT "6 Clicks" Mobility  Outcome Measure Help needed turning from your back to your side while in a flat bed without using bedrails?: None Help needed moving from lying on your back to sitting on the side of a  flat bed without using bedrails?: None Help needed moving to and from a bed to a chair (including a wheelchair)?: None Help needed standing up from a chair using your arms (e.g., wheelchair or bedside chair)?: None Help needed to walk in hospital room?: A Little Help needed climbing 3-5 steps with a railing? : A Little 6 Click Score: 22    End of Session   Activity Tolerance: Patient tolerated treatment well;Treatment limited secondary to agitation(Pt concerned about clothes and perseverating on same) Patient left: (Pt sitting EOB with sitter in room) Nurse Communication: Mobility status PT Visit Diagnosis: Difficulty in walking, not elsewhere classified (R26.2)    Time: 1040-1055 PT Time Calculation (min) (ACUTE ONLY): 15 min   Charges:   PT Evaluation $PT Eval Low Complexity: 1 Low          [place] Pager [phone] Office [phone]   Riona Lahti 11/21/2018, 12:28 PM

## 2018-11-21 NOTE — Progress Notes (Signed)
ANTICOAGULATION CONSULT NOTE - Initial Consult  Pharmacy Consult for rivaroxaban Indication: VTE treatment  Allergies  Allergen Reactions  . Warfarin Palpitations  . Doxycycline Rash  . Aricept [Donepezil Hcl] Other (See Comments)    Insomnia and not eating     Patient Measurements: Height: 5\' 5"  (165.1 cm) Weight: 154 lb 5.2 oz (70 kg) IBW/kg (Calculated) : 57  Vital Signs: Temp: 98.8 F (37.1 C) (04/23 0608) Temp Source: Oral (04/23 0608) BP: 153/59 (04/23 9758) Pulse Rate: 86 (04/23 0608)  Labs: Recent Labs    11/19/18 0144 11/19/18 1955 11/20/18 1027 11/21/18 0606  HGB 5.7* 9.1* 9.1* 8.4*  HCT 19.8* 29.2* 29.6* 28.1*  PLT 233  --  195 217  CREATININE 1.85*  --  1.10* 1.23*    Estimated Creatinine Clearance: 40 mL/min (A) (by C-G formula based on SCr of 1.23 mg/dL (H)).   Medical History: Past Medical History:  Diagnosis Date  . Allergic rhinitis, cause unspecified 01/01/2014  . Anxiety state, unspecified 01/01/2014  . Cataract   . Cognitive impairment   . Depression 01/01/2014  . Glaucoma   . Hypertension   . Other and unspecified hyperlipidemia 01/01/2014  . Renal insufficiency 01/01/2014  . Stroke [facility]) 2002   CVA x 2  . Unspecified asthma(493.90) 01/01/2014  . Vision decreased     Assessment: Pt admitted on 4/21 with melena and GIB. Pt was on rivaroxaban 20 mg PO daily PTA for LUE VTE diagnosed in Feb 2020. Of note, pt was using NSAIDs almost daily PTA. Pt did not have further melena on admission. GI following - performed upper endoscopy on 4/22. Per GI, ok to resume rivaroxaban on 4/23.   Pharmacy consulted to dose rivaroxaban for VTE treatment. Confirmed with MD the plan to resume home dose today.   Today, 11/21/18  Hgb 8.4 - low but stable s/p transfusion on 4/21  Plt - WNl, stable  SCr 1.2, CrCl = 45 mL/min using total body weight   Plan:   Resume rivaroxaban 20 mg PO daily with supper (PTA dose)  Monitor closely for signs/symptoms of  bleeding  Follow renal function and H/H  Recommend to discontinue NSAID use on discharge  Lenis Noon, PharmD 11/21/2018,10:55 AM

## 2018-11-21 NOTE — Progress Notes (Signed)
OT Cancellation Note  Patient Details Name: Stacy Brennan MRN: [id number] DOB: Oct 15, 1945   Cancelled Treatment:    Reason Eval/Treat Not Completed: Other (comment). Pt is sleeping and has been agitated. Asked sitter to page me when pt is awake  [place] 11/21/2018, 9:27 AM  Lesle Chris, OTR/L Acute Rehabilitation Services [phone] [facility] pager [phone] office 11/21/2018

## 2018-11-22 ENCOUNTER — Telehealth: Payer: Self-pay | Admitting: *Deleted

## 2018-11-22 ENCOUNTER — Encounter: Payer: Self-pay | Admitting: Gastroenterology

## 2018-11-22 NOTE — Telephone Encounter (Signed)
Transition Care Management Follow-up Telephone Call   Date discharged? 11/21/2018   How have you been since you were released from the hospital? SoSo okay but not great per legal guardian   Do you understand why you were in the hospital? Yes and no d/t dementia per her legal guardian    Do you understand the discharge instructions? YES (legal guardian understands the discharge instructions)   Where were you discharged to? Home     Items Reviewed:  Medications reviewed: Yes   Allergies reviewed: Yes   Dietary changes reviewed: Yes   Referrals reviewed:  Yes    Functional Questionnaire:   Activities of Daily Living (ADLs):   She states they are independent in the following: ambulation, bathing and hygiene, feeding, grooming and dressing States they require assistance with the following: toileting and continence    Any transportation issues/concerns?: No    Any patient concerns? Legal guardian would like a Rx for ensure and depends    Confirmed importance and date/time of follow-up visits scheduled Yes    Provider Appointment  with booked: Dorothyann Peng, NP (11/28/2018 at 2:00 PM)  Confirmed with patient if condition begins to worsen call PCP or go to the ER.  Patient was given the office number and encouraged to call back with question or concerns.  : Yes

## 2018-11-22 NOTE — Telephone Encounter (Signed)
Daughter, Erenest Rasher, called to ask if Stacy Brennan can stop taking Xarelto d/t GI bleeding episode that resulted in her being hospitalized at [facility]. I asked Dr. Donzetta Matters and he has approved her to stop it but has asked her to start taking ASA 81mg  daily. Patient voiced understanding and agreement of this plan.

## 2018-11-28 ENCOUNTER — Ambulatory Visit (INDEPENDENT_AMBULATORY_CARE_PROVIDER_SITE_OTHER): Payer: Medicare Other | Admitting: Adult Health

## 2018-11-28 ENCOUNTER — Other Ambulatory Visit: Payer: Self-pay

## 2018-11-28 DIAGNOSIS — R55 Syncope and collapse: Secondary | ICD-10-CM | POA: Diagnosis not present

## 2018-11-28 DIAGNOSIS — N179 Acute kidney failure, unspecified: Secondary | ICD-10-CM

## 2018-11-28 DIAGNOSIS — K921 Melena: Secondary | ICD-10-CM | POA: Diagnosis not present

## 2018-11-28 DIAGNOSIS — D62 Acute posthemorrhagic anemia: Secondary | ICD-10-CM

## 2018-11-28 NOTE — Progress Notes (Signed)
Virtual Visit via Video Note  I connected with Stacy Brennan on 11/29/18 at  2:00 PM EDT by a video enabled telemedicine application and verified that I am speaking with the correct person using two identifiers.  Location patient: home Location provider:work or home office Persons participating in the virtual visit: patient, provider, daughter  I discussed the limitations of evaluation and management by telemedicine and the availability of in person appointments. The patient expressed understanding and agreed to proceed.   HPI: 73 year old female who is being evaluated for TCM visit   Admit Date: 11/19/2018 Discharge Date 11/21/2018  She presented to the emergency room via EMS for evaluation of syncope and GI bleed.  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  Per EMS report she was found to have 200 cc of tarry stool around her and in the toilet.  Blood pressure was 112/55 heart rate was 72 by EMS.  Patient stated that she fell in the bathroom earlier that day.  She denied any dizziness/lightheadedness, chest pain, shortness of breath prior to the fall.  She was unsure if she lost consciousness.  He did report noticing dark stool and spots of blood in her stool the week previous.  She denied any abdominal pain or hematemesis.  She was taking daily NSAIDs.  She was admitted to the hospital for acute blood loss anemia with melena.  EGD on 422 found a nonbleeding gastric ulcer and nonbleeding duodenal ulcers.  Hospital Course   Acute blood loss anemia with upper GI bleed -She received 2 units of packed red blood cells during hospital visit -She was started on Protonix 40 mg twice a day x8 weeks, Xarelto was resumed on 4/23 and was stopped indefinitely on 4/24 by Dr. Donzetta Matters and she was started on daily baby aspirin. -He has follow-up appointment with GI on 412 for reevaluation and possible repeat EGD.  Syncopal versus presyncopal  episode -Etiology thought to be likely secondary to acute blood loss anemia -OT did evaluate patient in the hospital but she was back to baseline at this point  Hypotension  -BP on admission was 68/54.  Etiology likely from blood loss anemia.  Home antihypertensive regimen including labetalol 200 mg p.o. twice daily, spiral lactone 25 mg daily and verapamil 240 mg nightly was resumed on discharge  Renal failure on CKD stage III -Creatinine 1.85 on admission.  Her baseline falls between 1.3 and 1.4.  Etiology likely from hypotension and blood loss anemia.  Improved upon discharge  Today on reevaluation she reports "I feel great".  Her daughter feels as though her mother is "doing the best that she is done in many years".  There has been no noticeable bleeding, and the patient's appetite is good good as is energy level.  They have been monitoring blood pressures at home and today reported a reading of 128/63.  The patient denies dizziness, lightheadedness, shortness of breath, or chest pain.  There are repeat labs (H&H) scheduled for this coming Monday   ROS: See pertinent positives and negatives per HPI.  Past Medical History:  Diagnosis Date  . Allergic rhinitis, cause unspecified 01/01/2014  . Anxiety state, unspecified 01/01/2014  . Cataract   . Cognitive impairment   . Depression 01/01/2014  . Glaucoma   . Hypertension   . Other and unspecified hyperlipidemia 01/01/2014  . Renal insufficiency 01/01/2014  . Stroke [facility]) 2002   CVA x 2  . Unspecified asthma(493.90) 01/01/2014  .  Vision decreased     Past Surgical History:  Procedure Laterality Date  . BIOPSY  11/20/2018   Procedure: BIOPSY;  Surgeon: Lavena Bullion, DO;  Location: [facility];  Service: Gastroenterology;;  . DILATION AND CURETTAGE OF UTERUS    . ESOPHAGOGASTRODUODENOSCOPY (EGD) WITH PROPOFOL N/A 11/20/2018   Procedure: ESOPHAGOGASTRODUODENOSCOPY (EGD) WITH PROPOFOL;  Surgeon: Lavena Bullion, DO;  Location: [facility];  Service: Gastroenterology;  Laterality: N/A;  . THROMBECTOMY BRACHIAL ARTERY Left 08/30/2018   Procedure: LEFT SUBCLAVIAN ARTERY THROMBECTOMY;  Surgeon: Waynetta Sandy, MD;  Location: [place];  Service: Vascular;  Laterality: Left;  . [place]      Family History  Problem Relation Age of Onset  . Cancer Other        prostate cancer  . Hypertension Other   . Diabetes Other   . Thyroid disease Other   . Hyperlipidemia Other   . Hypertension Other   . Glaucoma Other   . Parkinsonism Neg Hx   . Dementia Neg Hx      Current Outpatient Medications:  .  acetaminophen (TYLENOL) 500 MG tablet, Take 1,000 mg by mouth every 6 (six) hours as needed for moderate pain., Disp: , Rfl:  .  alendronate (FOSAMAX) 70 MG tablet, Take 1 tablet (70 mg total) by mouth once a week. Take with a full glass of water on an empty stomach. (Patient taking differently: Take 70 mg by mouth every Monday. Take with a full glass of water on an empty stomach.), Disp: 4 tablet, Rfl: 11 .  ALPRAZolam (XANAX) 0.5 MG tablet, Take 1 tablet (0.5 mg total) by mouth 2 (two) times daily. (Patient taking differently: Take 0.5 mg by mouth 3 (three) times daily as needed for anxiety. ), Disp: 60 tablet, Rfl: 0 .  atorvastatin (LIPITOR) 20 MG tablet, Take 1 tablet (20 mg total) by mouth daily., Disp: 90 tablet, Rfl: 3 .  brimonidine (ALPHAGAN) 0.2 % ophthalmic solution, Place 1 drop into both eyes 2 (two) times daily. , Disp: , Rfl:  .  chlorhexidine (PERIDEX) 0.12 % solution, Use as directed 15 mLs in the mouth or throat daily as needed. Gum Diease, Disp: , Rfl:  .  diphenhydrAMINE (BENADRYL) 25 MG tablet, Take 50 mg by mouth every 6 (six) hours as needed for allergies., Disp: , Rfl:  .  dorzolamide-timolol (COSOPT) 22.3-6.8 MG/ML ophthalmic solution, Place 1 drop into both eyes 2 (two) times daily., Disp: , Rfl:  .  fluticasone (FLONASE) 50 MCG/ACT nasal spray, Place 1 spray into both nostrils daily as needed  for allergies or rhinitis., Disp: , Rfl:  .  labetalol (NORMODYNE) 200 MG tablet, TAKE 1 TABLET BY MOUTH TWICE DAILY (Patient taking differently: Take 200 mg by mouth 2 (two) times daily. ), Disp: 60 tablet, Rfl: 2 .  methylcellulose oral powder, Take 1 packet by mouth daily., Disp: , Rfl:  .  pantoprazole (PROTONIX) 40 MG tablet, Take 1 tablet (40 mg total) by mouth 2 (two) times daily., Disp: 60 tablet, Rfl: 1 .  Phenylephrine-Acetaminophen (SINUS AND HEADACHE) 5-325 MG TABS, Take 2 tablets by mouth every 6 (six) hours as needed. Congestion and headache, Disp: , Rfl:  .  RHOPRESSA 0.02 % SOLN, Place 1 drop into both eyes daily. , Disp: , Rfl:  .  sennosides-docusate sodium (SENOKOT-S) 8.6-50 MG tablet, Take 1 tablet by mouth daily., Disp: , Rfl:  .  spironolactone (ALDACTONE) 25 MG tablet, TAKE 1 TABLET (25 MG TOTAL) BY MOUTH EVERY MORNING. (  Patient taking differently: Take 25 mg by mouth daily. ), Disp: 30 tablet, Rfl: 3 .  Travoprost, BAK Free, (TRAVATAN) 0.004 % SOLN ophthalmic solution, Place 1 drop into both eyes at bedtime., Disp: , Rfl:  .  traZODone (DESYREL) 50 MG tablet, Take 1.5 tablets (75 mg total) by mouth at bedtime as needed for sleep., Disp: 135 tablet, Rfl: 0 .  verapamil (CALAN-SR) 240 MG CR tablet, TAKE 1 TABLET BY MOUTH AT BEDTIME (Patient taking differently: Take 240 mg by mouth daily. ), Disp: 30 tablet, Rfl: 9  EXAM:  VITALS per patient if applicable:  GENERAL: alert, oriented, appears well and in no acute distress  HEENT: atraumatic, conjunttiva clear, no obvious abnormalities on inspection of external nose and ears  NECK: normal movements of the head and neck  LUNGS: on inspection no signs of respiratory distress, breathing rate appears normal, no obvious gross SOB, gasping or wheezing  CV: no obvious cyanosis  MS: moves all visible extremities without noticeable abnormality  PSYCH/NEURO: pleasant and cooperative, no obvious depression or anxiety, speech and  thought processing grossly intact  ASSESSMENT AND PLAN:  Discussed the following assessment and plan:  Reviewed hospital admission notes, lab work, procedures with the patient and her daughter.  All questions answered to the best of my ability.  Patient appears to have made significant recovery.  She has no acute complaints; will await lab work and recommmendations from gastroenterology.  AKI (acute kidney injury) ([place])  Acute blood loss anemia  Gastrointestinal hemorrhage with melena  Syncope, non cardiac   I discussed the assessment and treatment plan with the patient. The patient was provided an opportunity to ask questions and all were answered. The patient agreed with the plan and demonstrated an understanding of the instructions.   The patient was advised to call back or seek an in-person evaluation if the symptoms worsen or if the condition fails to improve as anticipated.   Dorothyann Peng, NP

## 2018-11-29 ENCOUNTER — Encounter: Payer: Self-pay | Admitting: Adult Health

## 2018-12-02 ENCOUNTER — Other Ambulatory Visit (INDEPENDENT_AMBULATORY_CARE_PROVIDER_SITE_OTHER): Payer: Medicare Other

## 2018-12-02 DIAGNOSIS — K922 Gastrointestinal hemorrhage, unspecified: Secondary | ICD-10-CM

## 2018-12-02 LAB — HEMOGLOBIN: Hemoglobin: 10.3 g/dL — ABNORMAL LOW (ref 12.0–15.0)

## 2018-12-02 LAB — HEMATOCRIT: HCT: 31.9 % — ABNORMAL LOW (ref 36.0–46.0)

## 2018-12-06 ENCOUNTER — Other Ambulatory Visit: Payer: Self-pay | Admitting: Adult Health

## 2018-12-10 ENCOUNTER — Ambulatory Visit: Payer: Medicare Other | Admitting: Gastroenterology

## 2018-12-11 ENCOUNTER — Encounter: Payer: Self-pay | Admitting: Gastroenterology

## 2018-12-11 ENCOUNTER — Other Ambulatory Visit: Payer: Self-pay

## 2018-12-11 ENCOUNTER — Telehealth (INDEPENDENT_AMBULATORY_CARE_PROVIDER_SITE_OTHER): Payer: Medicare Other | Admitting: Gastroenterology

## 2018-12-11 VITALS — Ht 65.0 in

## 2018-12-11 DIAGNOSIS — Z5181 Encounter for therapeutic drug level monitoring: Secondary | ICD-10-CM

## 2018-12-11 DIAGNOSIS — K2100 Gastro-esophageal reflux disease with esophagitis, without bleeding: Secondary | ICD-10-CM

## 2018-12-11 DIAGNOSIS — K222 Esophageal obstruction: Secondary | ICD-10-CM | POA: Diagnosis not present

## 2018-12-11 DIAGNOSIS — K269 Duodenal ulcer, unspecified as acute or chronic, without hemorrhage or perforation: Secondary | ICD-10-CM | POA: Diagnosis not present

## 2018-12-11 DIAGNOSIS — D62 Acute posthemorrhagic anemia: Secondary | ICD-10-CM

## 2018-12-11 DIAGNOSIS — K449 Diaphragmatic hernia without obstruction or gangrene: Secondary | ICD-10-CM

## 2018-12-11 DIAGNOSIS — Z7982 Long term (current) use of aspirin: Secondary | ICD-10-CM

## 2018-12-11 DIAGNOSIS — K279 Peptic ulcer, site unspecified, unspecified as acute or chronic, without hemorrhage or perforation: Secondary | ICD-10-CM | POA: Diagnosis not present

## 2018-12-11 DIAGNOSIS — K259 Gastric ulcer, unspecified as acute or chronic, without hemorrhage or perforation: Secondary | ICD-10-CM

## 2018-12-11 MED ORDER — PANTOPRAZOLE SODIUM 40 MG PO TBEC: 40.0000 mg | DELAYED_RELEASE_TABLET | Freq: Every day | ORAL | 5 refills | Status: DC

## 2018-12-11 NOTE — Progress Notes (Signed)
Chief Complaint: Hospital follow-up, gastric and duodenal ulcers, GERD   Referring Provider:     Dorothyann Peng, NP   HPI:    Due to current restrictions/limitations of in-office visits due to the COVID-19 pandemic, this scheduled clinical appointment was converted to a telehealth consultation via telephone.  -Time of medical discussion: 20 minutes -The patient did consent to this telephone visit and is aware of possible charges through their insurance for this visit.  -Names of all parties present: Stacy Brennan (patient), Stacy Brennan (patient's daughter), Gerrit Heck, DO, [facility] (physician)  Stacy Brennan is a 73 y.o. 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  Transfused 3 units PRBCs for hemoglobin nadir 5.7 with an appropriate response, hemoglobin 8.4 at discharge on 4/23.  EGD on 4/22 notable for 3 proximal duodenal ulcers and one distal gastric ulcer, all nonbleeding and superficial.  No high risk stigmata of bleeding requiring endoscopic intervention.  Biopsies obtained and demonstrated duodenitis, gastritis, esophagitis, without H. pylori or dysplasia.  EGD also notable for a 2 cm hiatal hernia with lower esophageal esophagitis and a presumed peptic stricture, which was dilated with the endoscope alone.  Discharged with Protonix 40 mg p.o. twice daily x8 weeks, with plan to titrate to 40 mg daily for ongoing control of reflux and noted peptic stricture.  Recommended to stop all NSAIDs, and recommended repeat upper endoscopy 6 to 8 weeks after discharge.  Xarelto was stopped indefinitely by Dr. Donzetta Matters a couple days after d/c and started on daily aspirin 81 mg.  She presents to the Gastroenterology Clinic for routine hospital follow-up.  Repeat hemoglobin last week 10.3.  Followed  up with her PCM on 11/28/2018 and doing well at that time.  Today she states she feels well and no complaints. Tolerating PO intake without issue and no e/o ongoing bleeding.  Denies any reflux symptoms.  No dysphagia.  Weight is stable.  Current weight 157.6. Temp 98.2  Past medical history, past surgical history, social history, family history, medications, and allergies reviewed in the chart and with patient over the phone.  Past Medical History:  Diagnosis Date  . Allergic rhinitis, cause unspecified 01/01/2014  . Anxiety state, unspecified 01/01/2014  . Cataract   . Cognitive impairment   . Depression 01/01/2014  . Glaucoma   . Hypertension   . Other and unspecified hyperlipidemia 01/01/2014  . Renal insufficiency 01/01/2014  . Stroke [facility]) 2002   CVA x 2  . Unspecified asthma(493.90) 01/01/2014  . Vision decreased      Past Surgical History:  Procedure Laterality Date  . BIOPSY  11/20/2018   Procedure: BIOPSY;  Surgeon: Lavena Bullion, DO;  [facility];  Service: Gastroenterology;;  . DILATION AND CURETTAGE OF UTERUS    . ESOPHAGOGASTRODUODENOSCOPY (EGD) WITH PROPOFOL N/A 11/20/2018   Procedure: ESOPHAGOGASTRODUODENOSCOPY (EGD) WITH PROPOFOL;  Surgeon: Lavena Bullion, DO;  [facility];  Service: Gastroenterology;  Laterality: N/A;  . THROMBECTOMY BRACHIAL ARTERY Left 08/30/2018   Procedure: LEFT SUBCLAVIAN ARTERY THROMBECTOMY;  Surgeon: Waynetta Sandy, MD;  Location: [place];  Service: Vascular;  Laterality: Left;  . [place]     Family History  Problem Relation Age of Onset  . Cancer Other  prostate cancer  . Hypertension Other   . Diabetes Other   . Thyroid disease Other   . Hyperlipidemia Other   . Hypertension Other   . Glaucoma Other   . Parkinsonism Neg Hx   . Dementia Neg Hx   . Colon cancer Neg Hx   . Esophageal cancer Neg Hx    Social History   Tobacco Use  . Smoking status: Former Smoker    Last attempt to quit:  03/20/2002    Years since quitting: 16.7  . Smokeless tobacco: Former Systems developer    Types: Snuff  Substance Use Topics  . Alcohol use: No  . Drug use: No   Current Outpatient Medications  Medication Sig Dispense Refill  . acetaminophen (TYLENOL) 500 MG tablet Take 1,000 mg by mouth every 6 (six) hours as needed for moderate pain.    Marland Kitchen alendronate (FOSAMAX) 70 MG tablet Take 1 tablet (70 mg total) by mouth once a week. Take with a full glass of water on an empty stomach. (Patient taking differently: Take 70 mg by mouth every Monday. Take with a full glass of water on an empty stomach.) 4 tablet 11  . ALPRAZolam (XANAX) 0.5 MG tablet Take 1 tablet (0.5 mg total) by mouth 3 (three) times daily as needed for up to 30 days for anxiety. 90 tablet 2  . aspirin EC 81 MG tablet Take 81 mg by mouth daily.    Marland Kitchen atorvastatin (LIPITOR) 20 MG tablet Take 1 tablet (20 mg total) by mouth daily. 90 tablet 3  . brimonidine (ALPHAGAN) 0.2 % ophthalmic solution Place 1 drop into both eyes 2 (two) times daily.     . chlorhexidine (PERIDEX) 0.12 % solution Use as directed 15 mLs in the mouth or throat daily as needed. Gum Diease    . diphenhydrAMINE (BENADRYL) 25 MG tablet Take 50 mg by mouth every 6 (six) hours as needed for allergies.    Marland Kitchen dorzolamide-timolol (COSOPT) 22.3-6.8 MG/ML ophthalmic solution Place 1 drop into both eyes 2 (two) times daily.    . fluticasone (FLONASE) 50 MCG/ACT nasal spray Place 1 spray into both nostrils daily as needed for allergies or rhinitis.    Marland Kitchen labetalol (NORMODYNE) 200 MG tablet TAKE 1 TABLET BY MOUTH TWICE DAILY (Patient taking differently: Take 200 mg by mouth 2 (two) times daily. ) 60 tablet 2  . pantoprazole (PROTONIX) 40 MG tablet Take 1 tablet (40 mg total) by mouth 2 (two) times daily. 60 tablet 1  . Psyllium (METAMUCIL FIBER PO) Take by mouth daily. 1 Teaspoon daily    . RHOPRESSA 0.02 % SOLN Place 1 drop into both eyes daily.     . sennosides-docusate sodium (SENOKOT-S)  8.6-50 MG tablet Take 1 tablet by mouth as needed.     Marland Kitchen spironolactone (ALDACTONE) 25 MG tablet TAKE 1 TABLET (25 MG TOTAL) BY MOUTH EVERY MORNING. (Patient taking differently: Take 25 mg by mouth daily. ) 30 tablet 3  . Travoprost, BAK Free, (TRAVATAN) 0.004 % SOLN ophthalmic solution Place 1 drop into both eyes at bedtime.    . traZODone (DESYREL) 50 MG tablet Take 1.5 tablets (75 mg total) by mouth at bedtime as needed for sleep. 135 tablet 0  . verapamil (CALAN-SR) 240 MG CR tablet TAKE 1 TABLET BY MOUTH AT BEDTIME (Patient taking differently: Take 240 mg by mouth daily. ) 30 tablet 9  . methylcellulose oral powder Take 1 packet by mouth daily.     No current facility-administered medications for  this visit.    Allergies  Allergen Reactions  . Warfarin Palpitations  . Doxycycline Rash  . Aricept [Donepezil Hcl] Other (See Comments)    Insomnia and not eating      Review of Systems: All systems reviewed and negative except where noted in HPI.     Physical Exam:    Physical exam not completed due to the nature of this telehealth communication.  Patient was otherwise alert and oriented and well communicative.   ASSESSMENT AND PLAN;   Stacy Mae Zietlow is a 73 y.o. female presenting with:  1) Peptic Ulcer Disease: Recent admission for antral and duodenal ulcers, treated medically with high-dose PPI.  Improved hemoglobin by 2 units since discharge and no ongoing bleeding. -Resume Protonix 40 mg p.o. twice daily for a total of 8 weeks -Repeat upper endoscopy late June (8 weeks from hospitalization) to ensure ulcer healing with repeat biopsies as indicated at that time - Given need for ongoing long-term aspirin, will titrate Protonix down to 40 mg daily to control reflux, but recommend continuing some form of acid suppression therapy long-term for gastric prophylaxis  2) GERD with Erosive Esophagitis: -Resume high-dose PPI as above -Resume antireflux lifestyle measures  -Evaluate for mucosal healing at time of EGD as above - Plan to reduce Protonix to 40 mg daily after the 8-week course, and titrate to lowest effective dose  3) Peptic stricture: -Evaluate at time of EGD as above with dilation as appropriate - Does not endorse dysphagia  4) Hiatal hernia  5) Chronic aspirin use: -No need to hold for endoscopy - Has been off Xarelto since day after discharge  6) Posthemorrhagic anemia: -Hemoglobin improving post discharge.  No ongoing overt GI blood loss -Repeat hemoglobin in approximately 3 months  The indications, risks, and benefits of EGD were explained to the patient and her daughter in detail. Risks include but are not limited to bleeding, perforation, adverse reaction to medications, and cardiopulmonary compromise. Sequelae include but are not limited to the possibility of surgery, hositalization, and mortality. The patient and her daughter both verbalized understanding and wished to proceed. All questions answered, referred to scheduler. Further recommendations pending results of the exam.     Lavena Bullion, DO, FACG  12/11/2018, 1:57 PM   Nafziger, Tommi Rumps, NP

## 2018-12-11 NOTE — Patient Instructions (Signed)
To help prevent the possible spread of infection to our patients, communities, and staff; we will be implementing the following measures:  As of now we are not allowing any visitors/family members to accompany you to any upcoming appointments with [facility]. If you have any concerns about this please contact our office to discuss prior to the appointment.   You have been scheduled for an endoscopy. Please follow written instructions given to you at your visit today. If you use inhalers (even only as needed), please bring them with you on the day of your procedure. Your physician has requested that you go to www.startemmi.com and enter the access code given to you at your visit today. This web site gives a general overview about your procedure. However, you should still follow specific instructions given to you by our office regarding your preparation for the procedure.  We have sent the following medication to the pharmacy Protonix 40mg  daily   Please call [phone] if you have any question

## 2018-12-31 DIAGNOSIS — H43812 Vitreous degeneration, left eye: Secondary | ICD-10-CM | POA: Diagnosis not present

## 2018-12-31 DIAGNOSIS — Z961 Presence of intraocular lens: Secondary | ICD-10-CM | POA: Diagnosis not present

## 2018-12-31 DIAGNOSIS — H401133 Primary open-angle glaucoma, bilateral, severe stage: Secondary | ICD-10-CM | POA: Diagnosis not present

## 2018-12-31 DIAGNOSIS — H353132 Nonexudative age-related macular degeneration, bilateral, intermediate dry stage: Secondary | ICD-10-CM | POA: Diagnosis not present

## 2018-12-31 DIAGNOSIS — H26491 Other secondary cataract, right eye: Secondary | ICD-10-CM | POA: Diagnosis not present

## 2019-01-09 ENCOUNTER — Telehealth: Payer: Self-pay | Admitting: Family Medicine

## 2019-01-09 NOTE — Telephone Encounter (Signed)
Family is insistent that you said medication could be increased to 5.5 tabs.  I have set up a virtual appt to discuss.  Nothing further needed.

## 2019-01-09 NOTE — Telephone Encounter (Signed)
I see where you increased her dosage to 1.5.  I do not see where you increased it to 2.5 but she did ask about taking 2.5 (not 5.5).  Please advise.

## 2019-01-09 NOTE — Telephone Encounter (Signed)
Copied from [place] [id number]. Topic: General - Other >> Jan 08, 2019  4:06 PM Pauline Good wrote: Reason for CRM: pt's daughter [name] is calling and stating her mom medication for trazodone 50mg  had to be changed to 5.5 pills daily  instead of 1.5 a day. Directions and dosage need to be changed. Pt is out of medication.  Please call if any questions and advise

## 2019-01-09 NOTE — Telephone Encounter (Signed)
I would not go any higher than 150 mg ( 1.5 pills) on this medication. If it is not working then ok to schedule a virtual visit. If it is working then ok to fill for 90 days

## 2019-01-10 ENCOUNTER — Encounter: Payer: Self-pay | Admitting: Adult Health

## 2019-01-10 ENCOUNTER — Ambulatory Visit (INDEPENDENT_AMBULATORY_CARE_PROVIDER_SITE_OTHER): Payer: Medicare Other | Admitting: Adult Health

## 2019-01-10 ENCOUNTER — Other Ambulatory Visit: Payer: Self-pay

## 2019-01-10 DIAGNOSIS — I708 Atherosclerosis of other arteries: Secondary | ICD-10-CM | POA: Diagnosis not present

## 2019-01-10 DIAGNOSIS — F0391 Unspecified dementia with behavioral disturbance: Secondary | ICD-10-CM | POA: Diagnosis not present

## 2019-01-10 MED ORDER — TRAZODONE HCL 100 MG PO TABS: 200.0000 mg | ORAL_TABLET | Freq: Every day | ORAL | 1 refills | Status: DC

## 2019-01-10 NOTE — Progress Notes (Signed)
Virtual Visit via Video Note  I connected with Stacy Brennan on 01/10/19 at  3:00 PM EDT by a video enabled telemedicine application and verified that I am speaking with the correct person using two identifiers.  Location patient: home Location provider:work or home office Persons participating in the virtual visit: patient, provider, daughter  I discussed the limitations of evaluation and management by telemedicine and the availability of in person appointments. The patient expressed understanding and agreed to proceed.   HPI: 73 year old female who is being evaluated today for follow-up regarding sleep disturbance.  He was started on trazodone back in February for wandering behavior and sundowning syndrome.  She is originally started on 25 to 50 mg and has been slowly tapering since.  Less to been some miscommunication between the daughter and I, and the daughter has increased the trazodone dose to 250 mg nightly.  The daughter does report that since increasing to 200 mg and beyond the patient is going to bed at her normal bedtime but is no longer wondering at night and is not having issues with sundowning. The daughter does not feel as though the patient is waking up feeling "hungover".   ROS: See pertinent positives and negatives per HPI.  Past Medical History:  Diagnosis Date  . Allergic rhinitis, cause unspecified 01/01/2014  . Anxiety state, unspecified 01/01/2014  . Cataract   . Cognitive impairment   . Depression 01/01/2014  . Glaucoma   . Hypertension   . Other and unspecified hyperlipidemia 01/01/2014  . Renal insufficiency 01/01/2014  . Stroke [facility]) 2002   CVA x 2  . Unspecified asthma(493.90) 01/01/2014  . Vision decreased     Past Surgical History:  Procedure Laterality Date  . BIOPSY  11/20/2018   Procedure: BIOPSY;  Surgeon: Lavena Bullion, DO;  Location: [facility];  Service: Gastroenterology;;  . DILATION AND CURETTAGE OF UTERUS    . ESOPHAGOGASTRODUODENOSCOPY (EGD)  WITH PROPOFOL N/A 11/20/2018   Procedure: ESOPHAGOGASTRODUODENOSCOPY (EGD) WITH PROPOFOL;  Surgeon: Lavena Bullion, DO;  Location: [facility];  Service: Gastroenterology;  Laterality: N/A;  . THROMBECTOMY BRACHIAL ARTERY Left 08/30/2018   Procedure: LEFT SUBCLAVIAN ARTERY THROMBECTOMY;  Surgeon: Waynetta Sandy, MD;  Location: [place];  Service: Vascular;  Laterality: Left;  . [place]      Family History  Problem Relation Age of Onset  . Cancer Other        prostate cancer  . Hypertension Other   . Diabetes Other   . Thyroid disease Other   . Hyperlipidemia Other   . Hypertension Other   . Glaucoma Other   . Parkinsonism Neg Hx   . Dementia Neg Hx   . Colon cancer Neg Hx   . Esophageal cancer Neg Hx       Current Outpatient Medications:  .  acetaminophen (TYLENOL) 500 MG tablet, Take 1,000 mg by mouth every 6 (six) hours as needed for moderate pain., Disp: , Rfl:  .  alendronate (FOSAMAX) 70 MG tablet, Take 1 tablet (70 mg total) by mouth once a week. Take with a full glass of water on an empty stomach. (Patient taking differently: Take 70 mg by mouth every Monday. Take with a full glass of water on an empty stomach.), Disp: 4 tablet, Rfl: 11 .  aspirin EC 81 MG tablet, Take 81 mg by mouth daily., Disp: , Rfl:  .  atorvastatin (LIPITOR) 20 MG tablet, Take 1 tablet (20 mg total) by mouth daily., Disp: 90 tablet, Rfl: 3 .  brimonidine (ALPHAGAN) 0.2 % ophthalmic solution, Place 1 drop into both eyes 2 (two) times daily. , Disp: , Rfl:  .  chlorhexidine (PERIDEX) 0.12 % solution, Use as directed 15 mLs in the mouth or throat daily as needed. Gum Diease, Disp: , Rfl:  .  diphenhydrAMINE (BENADRYL) 25 MG tablet, Take 50 mg by mouth every 6 (six) hours as needed for allergies., Disp: , Rfl:  .  dorzolamide-timolol (COSOPT) 22.3-6.8 MG/ML ophthalmic solution, Place 1 drop into both eyes 2 (two) times daily., Disp: , Rfl:  .  fluticasone (FLONASE) 50 MCG/ACT nasal spray,  Place 1 spray into both nostrils daily as needed for allergies or rhinitis., Disp: , Rfl:  .  labetalol (NORMODYNE) 200 MG tablet, TAKE 1 TABLET BY MOUTH TWICE DAILY (Patient taking differently: Take 200 mg by mouth 2 (two) times daily. ), Disp: 60 tablet, Rfl: 2 .  methylcellulose oral powder, Take 1 packet by mouth daily., Disp: , Rfl:  .  pantoprazole (PROTONIX) 40 MG tablet, Take 1 tablet (40 mg total) by mouth daily., Disp: 90 tablet, Rfl: 5 .  Psyllium (METAMUCIL FIBER PO), Take by mouth daily. 1 Teaspoon daily, Disp: , Rfl:  .  RHOPRESSA 0.02 % SOLN, Place 1 drop into both eyes daily. , Disp: , Rfl:  .  sennosides-docusate sodium (SENOKOT-S) 8.6-50 MG tablet, Take 1 tablet by mouth as needed. , Disp: , Rfl:  .  spironolactone (ALDACTONE) 25 MG tablet, TAKE 1 TABLET (25 MG TOTAL) BY MOUTH EVERY MORNING. (Patient taking differently: Take 25 mg by mouth daily. ), Disp: 30 tablet, Rfl: 3 .  Travoprost, BAK Free, (TRAVATAN) 0.004 % SOLN ophthalmic solution, Place 1 drop into both eyes at bedtime., Disp: , Rfl:  .  traZODone (DESYREL) 50 MG tablet, Take 1.5 tablets (75 mg total) by mouth at bedtime as needed for sleep., Disp: 135 tablet, Rfl: 0 .  verapamil (CALAN-SR) 240 MG CR tablet, TAKE 1 TABLET BY MOUTH AT BEDTIME (Patient taking differently: Take 240 mg by mouth daily. ), Disp: 30 tablet, Rfl: 9  EXAM:  VITALS per patient if applicable:  GENERAL: alert, oriented, appears well and in no acute distress  HEENT: atraumatic, conjunttiva clear, no obvious abnormalities on inspection of external nose and ears  NECK: normal movements of the head and neck  LUNGS: on inspection no signs of respiratory distress, breathing rate appears normal, no obvious gross SOB, gasping or wheezing  CV: no obvious cyanosis  MS: moves all visible extremities without noticeable abnormality  PSYCH/NEURO: pleasant and cooperative, no obvious depression or anxiety, speech and thought processing grossly  intact  ASSESSMENT AND PLAN:  Discussed the following assessment and plan:  1. Dementia with behavioral disturbance, unspecified dementia type ([place]) -Do not want to increase past 200 mg nightly.  Urged to watch for morning somnolence.  Was advised to try cutting back to 150 mg nightly. - traZODone (DESYREL) 100 MG tablet; Take 2 tablets (200 mg total) by mouth at bedtime.  Dispense: 180 tablet; Refill: 1     I discussed the assessment and treatment plan with the patient. The patient was provided an opportunity to ask questions and all were answered. The patient agreed with the plan and demonstrated an understanding of the instructions.   The patient was advised to call back or seek an in-person evaluation if the symptoms worsen or if the condition fails to improve as anticipated.   Dorothyann Peng, NP

## 2019-01-14 ENCOUNTER — Telehealth: Payer: Self-pay | Admitting: Adult Health

## 2019-01-14 DIAGNOSIS — Z760 Encounter for issue of repeat prescription: Secondary | ICD-10-CM

## 2019-01-14 NOTE — Telephone Encounter (Signed)
Medication Refill - Medication:  verapamil (CALAN-SR) 240 MG CR tablet spironolactone (ALDACTONE) 25 MG tablet labetalol (NORMODYNE) 200 MG tablet  Has the patient contacted their pharmacy? Yes - another doctor was filling these, but RX has expired.  Pt needs a 90 day supply (Agent: If no, request that the patient contact the pharmacy for the refill.) (Agent: If yes, when and what did the pharmacy advise?)  Preferred Pharmacy (with phone number or street name):  [facility] [address] [place] - [address] [phone] (Phone) [phone] (Fax)     Agent: Please be advised that RX refills may take up to 3 business days. We ask that you follow-up with your pharmacy.

## 2019-01-15 MED ORDER — VERAPAMIL HCL ER 240 MG PO TBCR: 240.0000 mg | EXTENDED_RELEASE_TABLET | Freq: Every day | ORAL | 1 refills | Status: DC

## 2019-01-15 MED ORDER — SPIRONOLACTONE 25 MG PO TABS: ORAL_TABLET | ORAL | 1 refills | Status: DC

## 2019-01-15 MED ORDER — LABETALOL HCL 200 MG PO TABS: 200.0000 mg | ORAL_TABLET | Freq: Two times a day (BID) | ORAL | 1 refills | Status: DC

## 2019-01-15 NOTE — Telephone Encounter (Signed)
Sent to the pharmacy by e-scribe. 

## 2019-01-15 NOTE — Telephone Encounter (Signed)
Ok to refill for 90 +1 

## 2019-01-21 ENCOUNTER — Telehealth: Payer: Self-pay | Admitting: Gastroenterology

## 2019-01-21 NOTE — Telephone Encounter (Signed)
Spoke w/daughter Joelene Millin regarding Covid-19 Screening Questions:  Do you now or have you had a fever in the last 14 days? NO Do you have any respiratory symptoms of shortness of breath or cough now or in the last 14 days? NO Do you have any family members or close contacts with diagnosed or suspected Covid-19 in the past 14 days? NO  Have you been tested for Covid-19 and found to be positive? NO

## 2019-01-22 ENCOUNTER — Other Ambulatory Visit: Payer: Self-pay

## 2019-01-22 ENCOUNTER — Encounter: Payer: Self-pay | Admitting: Gastroenterology

## 2019-01-22 ENCOUNTER — Telehealth: Payer: Self-pay | Admitting: Gastroenterology

## 2019-01-22 ENCOUNTER — Other Ambulatory Visit: Payer: Self-pay | Admitting: *Deleted

## 2019-01-22 ENCOUNTER — Ambulatory Visit (AMBULATORY_SURGERY_CENTER): Payer: Medicare Other | Admitting: Gastroenterology

## 2019-01-22 VITALS — BP 145/72 | HR 65 | Temp 98.4°F | Resp 18 | Ht 65.0 in | Wt 159.0 lb

## 2019-01-22 DIAGNOSIS — K222 Esophageal obstruction: Secondary | ICD-10-CM

## 2019-01-22 DIAGNOSIS — K3189 Other diseases of stomach and duodenum: Secondary | ICD-10-CM | POA: Diagnosis not present

## 2019-01-22 DIAGNOSIS — K2100 Gastro-esophageal reflux disease with esophagitis, without bleeding: Secondary | ICD-10-CM

## 2019-01-22 DIAGNOSIS — K259 Gastric ulcer, unspecified as acute or chronic, without hemorrhage or perforation: Secondary | ICD-10-CM

## 2019-01-22 DIAGNOSIS — K2980 Duodenitis without bleeding: Secondary | ICD-10-CM

## 2019-01-22 DIAGNOSIS — K219 Gastro-esophageal reflux disease without esophagitis: Secondary | ICD-10-CM | POA: Diagnosis not present

## 2019-01-22 MED ORDER — ESOMEPRAZOLE MAGNESIUM 20 MG PO CPDR: 40.0000 mg | DELAYED_RELEASE_CAPSULE | Freq: Two times a day (BID) | ORAL | Status: DC

## 2019-01-22 MED ORDER — SUCRALFATE 1 GM/10ML PO SUSP: 1.0000 g | Freq: Two times a day (BID) | ORAL | 1 refills | Status: DC

## 2019-01-22 MED ORDER — ESOMEPRAZOLE MAGNESIUM 20 MG PO CPDR: 40.0000 mg | DELAYED_RELEASE_CAPSULE | Freq: Every day | ORAL | Status: DC

## 2019-01-22 MED ORDER — SODIUM CHLORIDE 0.9 % IV SOLN: 500.0000 mL | Freq: Once | INTRAVENOUS | Status: DC

## 2019-01-22 NOTE — Op Note (Signed)
[place] Patient Name: Stacy Brennan Procedure Date: 01/22/2019 11:03 AM MRN: [id number] Endoscopist: Gerrit Heck , MD Age: 73 Referring MD:  Date of Birth: [birth year] Gender: Female Account #: [id number] Procedure:                Upper GI endoscopy Indications:              Follow-up of reflux esophagitis, Follow-up of                            esophageal stenosis, Follow-up of gastric ulcer,                            Follow-up of duodenal ulcer                           73 yo female admitted in 10/2018 with UGIB 2/2                            gastric ulcer x1 and duodenal ulcers x3, treated                            with pRBC transfusions and high dose PPI therapy.                            Admission EGD also notable for LA Grade A                            esophagitis and a peptic stricture, which was                            dilated with the endoscope passage alone. She                            presents today to evaluate for mucosal healing on                            high dose PPI therapy (pantoprazole 40 mg PO BID x8                            weeks). Medicines:                Monitored Anesthesia Care Procedure:                Pre-Anesthesia Assessment:                           - Prior to the procedure, a History and Physical                            was performed, and patient medications and                            allergies were reviewed. The patient's tolerance of  previous anesthesia was also reviewed. The risks                            and benefits of the procedure and the sedation                            options and risks were discussed with the patient.                            All questions were answered, and informed consent                            was obtained. Prior Anticoagulants: The patient has                            taken no previous anticoagulant or antiplatelet   agents except for aspirin. ASA Grade Assessment:                            III - A patient with severe systemic disease. After                            reviewing the risks and benefits, the patient was                            deemed in satisfactory condition to undergo the                            procedure.                           After obtaining informed consent, the endoscope was                            passed under direct vision. Throughout the                            procedure, the patient's blood pressure, pulse, and                            oxygen saturations were monitored continuously. The                            Endoscope was introduced through the mouth, and                            advanced to the second part of duodenum. The upper                            GI endoscopy was accomplished without difficulty.                            The patient tolerated the procedure well. Scope In: Scope Out: Findings:  The upper third of the esophagus and middle third                            of the esophagus were normal.                           One benign-appearing, intrinsic moderate stenosis                            was found 39 cm from the incisors. This stenosis                            measured 1 cm (inner diameter) x 1 cm (in length).                            The stenosis was traversed. A TTS dilator was                            passed through the scope. Dilation with an 06-11-12                            mm balloon and a 13.5-14.5-15.5 mm balloon dilator                            was performed to 15.5 mm. The dilation site was                            examined and showed mild mucosal disruption and 2                            mucosal rents, consistent with successful dilation.                            Estimated blood loss was minimal.                           The Z-line was regular and was found 40 cm from the                             incisors. The previously noted erosive esophagitis                            has since healed.                           Four non-bleeding superficial gastric ulcers with                            adherent clot were found in the gastric body. The                            largest lesion was 5 mm in largest dimension. There  was mild surrounding mucosal erythema. There was                            hematin around 2 of the larger ulcers. For                            hemostasis, three hemostatic clips were                            successfully placed. Area was successfully injected                            with 3.5 mL of a 1:10,000 solution of epinephrine                            for hemostasis with appropriate mucosal blanching.                            Estimated blood loss was minimal. Biopsies were                            taken with a cold forceps for histology. Estimated                            blood loss was minimal.                           The gastric fundus, incisura, gastric antrum and                            pylorus were normal. The previously noted antral                            ulcer has since healed.                           Localized mildly erythematous mucosa without active                            bleeding and with no stigmata of bleeding was found                            in the duodenal bulb. The previously noted ulcers                            were no longer present. Biopsies were taken with a                            cold forceps for histology. Estimated blood loss                            was minimal.  The second portion of the duodenum was normal. Complications:            No immediate complications. Estimated Blood Loss:     Estimated blood loss was minimal. Impression:               - Normal upper third of esophagus and middle third                            of  esophagus.                           - Benign-appearing esophageal stenosis. Dilated                            with a 15.5 mm TTS balloon with appropriate mucosal                            rent x2.                           - Z-line regular, 40 cm from the incisors.                           - Non-bleeding gastric ulcers with adherent clot.                            Clips were placed. Injected. Biopsied.                           - Normal gastric fundus, incisura, antrum and                            pylorus.                           - Erythematous duodenopathy. Biopsied.                           - Normal second portion of the duodenum.                           - While the previously noted erosive esophagitis,                            antral ulcer and duodenal ulcers have healed, there                            were 4 new gastric ulcer noted on this study, 2 of                            which had adherent clot with surrounding hematin,                            highly sugestive of recent bleeding/risk of  rebleeding. These were treated with 3 hemostatic                            clips and Epinehrine injection. However, the                            presence of these ulcers despite ongoing high dise                            PPI therapy warrants a change in therapy. Will                            change pantoprazole to esomeprazole as below,and                            add sucralfate, with strong consideration for                            repeat EGD in 6-8 weeks to reassess. Recommendation:           - Patient has a contact number available for                            emergencies. The signs and symptoms of potential                            delayed complications were discussed with the                            patient. Return to normal activities tomorrow.                            Written discharge instructions were provided to the                             patient.                           - Soft diet today.                           - Continue present medications.                           - Await pathology results.                           - Repeat upper endoscopy in 6-8 weeks for                            reassessment and possible retreatment.                           - Return to GI clinic at appointment to be  scheduled.                           - Change pantoprazole to Nexium (esomeprazole) 40                            mg PO BID for 8 weeks, and if ulcers are healing,                            will reduce to 40 mg daily for ongoing gastric                            prophylaxis given need for long term ASA use.                           - Use sucralfate tablets 1 gram PO BID for 4 weeks. Gerrit Heck, MD 01/22/2019 12:03:07 PM

## 2019-01-22 NOTE — Progress Notes (Signed)
Called to room to assist during endoscopic procedure.  Patient ID and intended procedure confirmed with present staff. Received instructions for my participation in the procedure from the performing physician.  

## 2019-01-22 NOTE — Progress Notes (Signed)
PT taken to PACU. Monitors in place. VSS. Report given to RN. 

## 2019-01-22 NOTE — Progress Notes (Signed)
Pt's states no medical or surgical changes since previsit or office visit. 

## 2019-01-22 NOTE — Patient Instructions (Signed)
Thank you for allowing [place] to participate in your care today!  Await  pathology results by mail, approximately 2 weeks.  Start new medications (2). These were sent to your current pharmacy.  Discontinue Pantoprazole.  Begin Nexium.  Return to GI clinic in 6-8 weeks for reassessment and possible retreatment.  We will call to schedule this.  Soft diet today.  May resume regular diet tomorrow.  Resume regular activities tomorrow.       YOU HAD AN ENDOSCOPIC PROCEDURE TODAY AT [facility]:   Refer to the procedure report that was given to you for any specific questions about what was found during the examination.  If the procedure report does not answer your questions, please call your gastroenterologist to clarify.  If you requested that your care partner not be given the details of your procedure findings, then the procedure report has been included in a sealed envelope for you to review at your convenience later.  YOU SHOULD EXPECT: Some feelings of bloating in the abdomen. Passage of more gas than usual.  Walking can help get rid of the air that was put into your GI tract during the procedure and reduce the bloating. If you had a lower endoscopy (such as a colonoscopy or flexible sigmoidoscopy) you may notice spotting of blood in your stool or on the toilet paper. If you underwent a bowel prep for your procedure, you may not have a normal bowel movement for a few days.  Please Note:  You might notice some irritation and congestion in your nose or some drainage.  This is from the oxygen used during your procedure.  There is no need for concern and it should clear up in a day or so.  SYMPTOMS TO REPORT IMMEDIATELY:    Following upper endoscopy (EGD)  Vomiting of blood or coffee ground material  New chest pain or pain under the shoulder blades  Painful or persistently difficult swallowing  New shortness of breath  Fever of 100F or higher  Black, tarry-looking  stools  For urgent or emergent issues, a gastroenterologist can be reached at any hour by calling [phone].   DIET:  We do recommend a small meal at first, but then you may proceed to your regular diet.  Drink plenty of fluids but you should avoid alcoholic beverages for 24 hours.  ACTIVITY:  You should plan to take it easy for the rest of today and you should NOT DRIVE or use heavy machinery until tomorrow (because of the sedation medicines used during the test).    FOLLOW UP: Our staff will call the number listed on your records 48-72 hours following your procedure to check on you and address any questions or concerns that you may have regarding the information given to you following your procedure. If we do not reach you, we will leave a message.  We will attempt to reach you two times.  During this call, we will ask if you have developed any symptoms of COVID 19. If you develop any symptoms (ie: fever, flu-like symptoms, shortness of breath, cough etc.) before then, please call [phone].  If you test positive for Covid 19 in the 2 weeks post procedure, please call and report this information to [place].    If any biopsies were taken you will be contacted by phone or by letter within the next 1-3 weeks.  Please call us at [phone] if you have not heard about the biopsies in 3 weeks.  SIGNATURES/CONFIDENTIALITY: You and/or your care partner have signed paperwork which will be entered into your electronic medical record.  These signatures attest to the fact that that the information above on your After Visit Summary has been reviewed and is understood.  Full responsibility of the confidentiality of this discharge information lies with you and/or your care-partner.

## 2019-01-23 NOTE — Telephone Encounter (Signed)
Contacted pharmacy, gave verbal order for Nexium.  Called patient's daughter back to apologize for inconvenience and advised pharmacy said med was ready for pick up.

## 2019-01-24 ENCOUNTER — Other Ambulatory Visit: Payer: Self-pay | Admitting: Adult Health

## 2019-01-24 ENCOUNTER — Telehealth: Payer: Self-pay

## 2019-01-24 DIAGNOSIS — F0391 Unspecified dementia with behavioral disturbance: Secondary | ICD-10-CM

## 2019-01-24 NOTE — Telephone Encounter (Signed)
New prescription for Trazodone was sent in on 6/12

## 2019-01-24 NOTE — Telephone Encounter (Signed)
  Follow up Call-  Call back number 01/22/2019  Post procedure Call Back phone  # [phone] ok to speak to daughter  Permission to leave phone message Yes  Some recent data might be hidden     Patient questions:  Do you have a fever, pain , or abdominal swelling? No. Pain Score  0 *  Have you tolerated food without any problems? Yes.    Have you been able to return to your normal activities? Yes.    Do you have any questions about your discharge instructions: Diet   No. Medications  No. Follow up visit  No.  Do you have questions or concerns about your Care? No.  Actions: * If pain score is 4 or above: No action needed, pain <4.  1. Have you developed a fever since your procedure? no  2.   Have you had an respiratory symptoms (SOB or cough) since your procedure? no  3.   Have you tested positive for COVID 19 since your procedure no  4.   Have you had any family members/close contacts diagnosed with the COVID 19 since your procedure?  no   If yes to any of these questions please route to Joylene John, RN and Alphonsa Gin, Therapist, sports.

## 2019-01-24 NOTE — Telephone Encounter (Signed)
Attempted to reach pt. With follow-up call following endoscopic procedure 01/22/2019.  LM On pt. Voice mail.  Will try to reach pt. Again later today.

## 2019-02-06 ENCOUNTER — Telehealth: Payer: Self-pay | Admitting: Gastroenterology

## 2019-02-06 NOTE — Telephone Encounter (Signed)
Pt's daughter Casimer Lanius called stating that [facility] told her that Nexium needs [facility]. She wants to knnow if pt can take something else in the meantime, pls send it to [facility] on [address].

## 2019-02-07 ENCOUNTER — Encounter: Payer: Self-pay | Admitting: Gastroenterology

## 2019-02-10 MED ORDER — ESOMEPRAZOLE MAGNESIUM 40 MG PO CPDR: 40.0000 mg | DELAYED_RELEASE_CAPSULE | Freq: Two times a day (BID) | ORAL | 0 refills | Status: AC

## 2019-02-10 NOTE — Telephone Encounter (Signed)
Prior Auth has been sent into [facility] for Nexium, patients daughter is coming by the [facility] office to pick up samples to get through until PA is approved or denied.

## 2019-02-10 NOTE — Telephone Encounter (Signed)
Patient daughter calling back regarding this. Best # [phone]

## 2019-02-20 ENCOUNTER — Telehealth: Payer: Self-pay | Admitting: Gastroenterology

## 2019-02-20 ENCOUNTER — Other Ambulatory Visit: Payer: Self-pay | Admitting: Gastroenterology

## 2019-02-20 NOTE — Telephone Encounter (Signed)
Called and spoke with patient's daughter- she reports the patient is not eating any meals, she is mostly drinking 2 ensures and 2 bottles of water per day; patient's daughter reports she is tasting salt in "everything" and is having a small bowel movement every other day; patient's daughter also reports she is not wanting to eat and they are having to "force" her to eat but they don't know if that is because of the dementia or something is wrong with her belly; patient fell last night and when BP was checked it was 180/72 patient's normal is 160's/70's  Advised patient's daughter to push po fluids (she bought pedialyte to try) and to also give Miralax if she felt the patient was constipated as to why she is not eating; advised daughter to call back to the office should questions/concerns arise; Patient's daughter verbalized understanding of information/instructions;   Please advise

## 2019-02-21 NOTE — Telephone Encounter (Signed)
Patient's daughter-Kimmie-called back to the office reporting that the patient has had vast improvement after consuming Pedialyte and having a bowel movement post Miralax- Kimmie was advised to call back to the office should symptoms worsen or not improvement greatly from here on; Kimmie verbalized understanding of information/instructions;

## 2019-02-21 NOTE — Telephone Encounter (Signed)
Agree with your advice and let's monitor for improvement with improvement in bowel habits. If ongoing sxs, may warrant in-office evaluation. Thanks.

## 2019-02-21 NOTE — Telephone Encounter (Signed)
Left message for patient to call back  

## 2019-03-17 ENCOUNTER — Telehealth: Payer: Self-pay | Admitting: Gastroenterology

## 2019-03-17 ENCOUNTER — Other Ambulatory Visit: Payer: Self-pay | Admitting: Adult Health

## 2019-03-17 NOTE — Telephone Encounter (Signed)
No answer and No vmail setup, unable to leave a message regarding Covid-19 screening questions. Covid-19 Screening Questions:  Do you now or have you had a fever in the last 14 days?   Do you have any respiratory symptoms of shortness of breath or cough now or in the last 14 days?   Do you have any family members or close contacts with diagnosed or suspected Covid-19 in the past 14 days?   Have you been tested for Covid-19 and found to be positive?

## 2019-03-18 ENCOUNTER — Encounter: Payer: Self-pay | Admitting: Gastroenterology

## 2019-03-18 ENCOUNTER — Other Ambulatory Visit: Payer: Self-pay | Admitting: Gastroenterology

## 2019-03-18 ENCOUNTER — Other Ambulatory Visit: Payer: Self-pay

## 2019-03-18 ENCOUNTER — Ambulatory Visit (AMBULATORY_SURGERY_CENTER): Payer: Medicare Other | Admitting: Gastroenterology

## 2019-03-18 VITALS — BP 136/72 | HR 48 | Temp 98.1°F | Resp 18 | Ht 66.0 in | Wt 159.0 lb

## 2019-03-18 DIAGNOSIS — K259 Gastric ulcer, unspecified as acute or chronic, without hemorrhage or perforation: Secondary | ICD-10-CM

## 2019-03-18 DIAGNOSIS — K279 Peptic ulcer, site unspecified, unspecified as acute or chronic, without hemorrhage or perforation: Secondary | ICD-10-CM | POA: Diagnosis not present

## 2019-03-18 DIAGNOSIS — K2980 Duodenitis without bleeding: Secondary | ICD-10-CM

## 2019-03-18 DIAGNOSIS — R1319 Other dysphagia: Secondary | ICD-10-CM

## 2019-03-18 DIAGNOSIS — R1310 Dysphagia, unspecified: Secondary | ICD-10-CM | POA: Diagnosis not present

## 2019-03-18 DIAGNOSIS — K2100 Gastro-esophageal reflux disease with esophagitis, without bleeding: Secondary | ICD-10-CM

## 2019-03-18 DIAGNOSIS — K222 Esophageal obstruction: Secondary | ICD-10-CM | POA: Diagnosis not present

## 2019-03-18 DIAGNOSIS — I10 Essential (primary) hypertension: Secondary | ICD-10-CM | POA: Diagnosis not present

## 2019-03-18 DIAGNOSIS — K219 Gastro-esophageal reflux disease without esophagitis: Secondary | ICD-10-CM | POA: Diagnosis not present

## 2019-03-18 DIAGNOSIS — K449 Diaphragmatic hernia without obstruction or gangrene: Secondary | ICD-10-CM

## 2019-03-18 MED ORDER — SODIUM CHLORIDE 0.9 % IV SOLN: 500.0000 mL | Freq: Once | INTRAVENOUS | Status: DC

## 2019-03-18 NOTE — Progress Notes (Signed)
Report given to PACU, vss 

## 2019-03-18 NOTE — Op Note (Signed)
[facility] Patient Name: Stacy Brennan Procedure Date: 03/18/2019 11:06 AM MRN: [id number] Endoscopist: Gerrit Heck , MD Age: 73 Referring MD:  Date of Birth: 10/26/1945 Gender: Female Account #: [id number] Procedure:                Upper GI endoscopy Indications:              Dysphagia, Follow-up of reflux esophagitis,                            Follow-up of gastritis, Follow-up of esophageal                            stricture                           History of GU and DU in 10/2018, along with peptic                            stricture and LA Grade A esophagitis, treated with                            high dose PPI. Repeat EGD in 12/2018 with stricture                            at 39 cm, dilated to 15.5 mm TTS balloon, along                            with 4 gastric ulcers, requiring endoscopic                            treatment of 2 with clips x3 and epinephrine.                            Biopsies negative for H pylori. She presents today                            to evaluate for mucosal healing and possible repeat                            esophageal dilation. Medicines:                Monitored Anesthesia Care Procedure:                Pre-Anesthesia Assessment:                           - Prior to the procedure, a History and Physical                            was performed, and patient medications and                            allergies were reviewed. The patient's tolerance of  previous anesthesia was also reviewed. The risks                            and benefits of the procedure and the sedation                            options and risks were discussed with the patient.                            All questions were answered, and informed consent                            was obtained. Prior Anticoagulants: The patient has                            taken no previous anticoagulant or antiplatelet      agents except for aspirin. ASA Grade Assessment:                            III - A patient with severe systemic disease. After                            reviewing the risks and benefits, the patient was                            deemed in satisfactory condition to undergo the                            procedure.                           After obtaining informed consent, the endoscope was                            passed under direct vision. Throughout the                            procedure, the patient's blood pressure, pulse, and                            oxygen saturations were monitored continuously. The                            Endoscope was introduced through the mouth, and                            advanced to the second part of duodenum. The upper                            GI endoscopy was accomplished without difficulty.                            The patient tolerated the procedure well. Scope In: Scope Out: Findings:  One benign-appearing, intrinsic mild stenosis was                            found 39 cm from the incisors. This stenosis                            measured less than one cm (in length). The stenosis                            was easily traversed. Due to the extensive food in                            the stomach, prolonged procedure time with                            esophageal dilation was not performed.                           The upper third of the esophagus and middle third                            of the esophagus were normal.                           A 2 cm hiatal hernia was present.                           A large amount of food (residue) was found in the                            entire examined stomach. This significantly                            precluded visualization of the gastric mucosa.                           The duodenal bulb, first portion of the duodenum                            and second  portion of the duodenum were normal. Complications:            No immediate complications. Estimated Blood Loss:     Estimated blood loss: none. Impression:               - Benign-appearing esophageal stenosis.                           - Normal upper third of esophagus and middle third                            of esophagus.                           - 2 cm hiatal hernia.                           -  A large amount of food (residue) in the stomach.                           - Normal duodenal bulb, first portion of the                            duodenum and second portion of the duodenum.                           - No specimens collected. Recommendation:           - Patient has a contact number available for                            emergencies. The signs and symptoms of potential                            delayed complications were discussed with the                            patient. Return to normal activities tomorrow.                            Written discharge instructions were provided to the                            patient.                           - Resume previous diet today.                           - Continue present medications.                           - Return to GI clinic at appointment to be                            scheduled.                           - Repeat upper endoscopy PRN for retreatment.                           - Depending on symptomatology at follow-up, may                            consider Gastric Emptying Study. Gerrit Heck, MD 03/18/2019 11:28:12 AM

## 2019-03-18 NOTE — Progress Notes (Signed)
Pt's states no medical or surgical changes since previsit or office visit. 

## 2019-03-18 NOTE — Patient Instructions (Signed)
YOU HAD AN ENDOSCOPIC PROCEDURE TODAY AT THE [facility]:   Refer to the procedure report that was given to you for any specific questions about what was found during the examination.  If the procedure report does not answer your questions, please call your gastroenterologist to clarify.  If you requested that your care partner not be given the details of your procedure findings, then the procedure report has been included in a sealed envelope for you to review at your convenience later.  YOU SHOULD EXPECT: Some feelings of bloating in the abdomen. Passage of more gas than usual.  Walking can help get rid of the air that was put into your GI tract during the procedure and reduce the bloating. If you had a lower endoscopy (such as a colonoscopy or flexible sigmoidoscopy) you may notice spotting of blood in your stool or on the toilet paper. If you underwent a bowel prep for your procedure, you may not have a normal bowel movement for a few days.  Please Note:  You might notice some irritation and congestion in your nose or some drainage.  This is from the oxygen used during your procedure.  There is no need for concern and it should clear up in a day or so.  SYMPTOMS TO REPORT IMMEDIATELY:   Following upper endoscopy (EGD)  Vomiting of blood or coffee ground material  New chest pain or pain under the shoulder blades  Painful or persistently difficult swallowing  New shortness of breath  Fever of 100F or higher  Black, tarry-looking stools  For urgent or emergent issues, a gastroenterologist can be reached at any hour by calling [phone].   DIET:  We do recommend a small meal at first, but then you may proceed to your regular diet.  Drink plenty of fluids but you should avoid alcoholic beverages for 24 hours.  ACTIVITY:  You should plan to take it easy for the rest of today and you should NOT DRIVE or use heavy machinery until tomorrow (because of the sedation medicines used  during the test).    FOLLOW UP: Our staff will call the number listed on your records 48-72 hours following your procedure to check on you and address any questions or concerns that you may have regarding the information given to you following your procedure. If we do not reach you, we will leave a message.  We will attempt to reach you two times.  During this call, we will ask if you have developed any symptoms of COVID 19. If you develop any symptoms (ie: fever, flu-like symptoms, shortness of breath, cough etc.) before then, please call [phone].  If you test positive for Covid 19 in the 2 weeks post procedure, please call and report this information to us.    If any biopsies were taken you will be contacted by phone or by letter within the next 1-3 weeks.  Please call us at [phone] if you have not heard about the biopsies in 3 weeks.    SIGNATURES/CONFIDENTIALITY: You and/or your care partner have signed paperwork which will be entered into your electronic medical record.  These signatures attest to the fact that that the information above on your After Visit Summary has been reviewed and is understood.  Full responsibility of the confidentiality of this discharge information lies with you and/or your care-partner. 

## 2019-03-19 ENCOUNTER — Other Ambulatory Visit: Payer: Self-pay

## 2019-03-20 ENCOUNTER — Telehealth: Payer: Self-pay

## 2019-03-20 NOTE — Telephone Encounter (Signed)
Called [phone], [name], pt's daughter and left a messaged we tried to reach pt for a follow up call. maw

## 2019-03-20 NOTE — Telephone Encounter (Signed)
Left message for patient concerning f/u appt made for patient to see Dr. Bryan Lemma on 04/04/2019 at 3:00 pm; patient advised to call back to the office and change appt date/time if they do not work for her;

## 2019-04-04 ENCOUNTER — Other Ambulatory Visit: Payer: Self-pay

## 2019-04-04 ENCOUNTER — Telehealth (INDEPENDENT_AMBULATORY_CARE_PROVIDER_SITE_OTHER): Payer: Medicare Other | Admitting: Adult Health

## 2019-04-04 ENCOUNTER — Telehealth (INDEPENDENT_AMBULATORY_CARE_PROVIDER_SITE_OTHER): Payer: Medicare Other | Admitting: Gastroenterology

## 2019-04-04 ENCOUNTER — Encounter: Payer: Self-pay | Admitting: Gastroenterology

## 2019-04-04 VITALS — Ht 67.0 in | Wt 151.4 lb

## 2019-04-04 DIAGNOSIS — G4701 Insomnia due to medical condition: Secondary | ICD-10-CM | POA: Diagnosis not present

## 2019-04-04 DIAGNOSIS — K279 Peptic ulcer, site unspecified, unspecified as acute or chronic, without hemorrhage or perforation: Secondary | ICD-10-CM | POA: Diagnosis not present

## 2019-04-04 DIAGNOSIS — K222 Esophageal obstruction: Secondary | ICD-10-CM | POA: Diagnosis not present

## 2019-04-04 DIAGNOSIS — I708 Atherosclerosis of other arteries: Secondary | ICD-10-CM | POA: Diagnosis not present

## 2019-04-04 DIAGNOSIS — K449 Diaphragmatic hernia without obstruction or gangrene: Secondary | ICD-10-CM | POA: Diagnosis not present

## 2019-04-04 DIAGNOSIS — F0391 Unspecified dementia with behavioral disturbance: Secondary | ICD-10-CM

## 2019-04-04 DIAGNOSIS — K219 Gastro-esophageal reflux disease without esophagitis: Secondary | ICD-10-CM | POA: Diagnosis not present

## 2019-04-04 MED ORDER — RISPERIDONE 0.5 MG PO TABS: 0.5000 mg | ORAL_TABLET | Freq: Every day | ORAL | 1 refills | Status: DC

## 2019-04-04 NOTE — Progress Notes (Signed)
Virtual Visit via Video Note  I connected with Stacy Brennan on 04/04/19 at  1:00 PM EDT by a video enabled telemedicine application and verified that I am speaking with the correct person using two identifiers.  Location patient: home Location provider:work or home office Persons participating in the virtual visit: patient, provider, daughter  I discussed the limitations of evaluation and management by telemedicine and the availability of in person appointments. The patient expressed understanding and agreed to proceed.   HPI: 73 year old female who is being evaluated today regarding dementia and insomnia.  Her daughter reports that the patient is not sleeping, she is given 200 mg of trazodone around 8 or 9 PM but does not fall asleep until the early morning.  She then sleeps throughout the day.  Daughter also reports that she is having visual hallucinations of a small child in the house.  Denies auditory hallucinations.  Her appetite is not good, but she does eat an evening meal.  She no longer likes the taste of most foods and will not drink Ensure.  He has been trialed on Aricept and Namenda in the past but did not tolerate medications.  The daughter is also asking for a prescription for a rolling walker as well as a cane.   ROS: See pertinent positives and negatives per HPI.  Past Medical History:  Diagnosis Date  . Allergic rhinitis, cause unspecified 01/01/2014  . Anxiety state, unspecified 01/01/2014  . Cataract   . Cognitive impairment   . Depression 01/01/2014  . Glaucoma   . Hypertension   . Other and unspecified hyperlipidemia 01/01/2014  . Renal insufficiency 01/01/2014  . Stroke [facility]) 2002   CVA x 2  . Unspecified asthma(493.90) 01/01/2014  . Vision decreased     Past Surgical History:  Procedure Laterality Date  . BIOPSY  11/20/2018   Procedure: BIOPSY;  Surgeon: Lavena Bullion, DO;  Location: [facility];  Service: Gastroenterology;;  . DILATION AND CURETTAGE OF UTERUS     . ESOPHAGOGASTRODUODENOSCOPY (EGD) WITH PROPOFOL N/A 11/20/2018   Procedure: ESOPHAGOGASTRODUODENOSCOPY (EGD) WITH PROPOFOL;  Surgeon: Lavena Bullion, DO;  Location: [facility];  Service: Gastroenterology;  Laterality: N/A;  . THROMBECTOMY BRACHIAL ARTERY Left 08/30/2018   Procedure: LEFT SUBCLAVIAN ARTERY THROMBECTOMY;  Surgeon: Waynetta Sandy, MD;  Location: [facility];  Service: Vascular;  Laterality: Left;  . [place]      Family History  Problem Relation Age of Onset  . Cancer Other        prostate cancer  . Hypertension Other   . Diabetes Other   . Thyroid disease Other   . Hyperlipidemia Other   . Hypertension Other   . Glaucoma Other   . Parkinsonism Neg Hx   . Dementia Neg Hx   . Colon cancer Neg Hx   . Esophageal cancer Neg Hx      Current Outpatient Medications:  .  acetaminophen (TYLENOL) 500 MG tablet, Take 1,000 mg by mouth every 6 (six) hours as needed for moderate pain., Disp: , Rfl:  .  alendronate (FOSAMAX) 70 MG tablet, Take 1 tablet (70 mg total) by mouth once a week. Take with a full glass of water on an empty stomach. (Patient taking differently: Take 70 mg by mouth every Monday. Take with a full glass of water on an empty stomach.), Disp: 4 tablet, Rfl: 11 .  ALPRAZolam (XANAX) 0.5 MG tablet, TAKE 1 TABLET BY MOUTH 3 TIMES DAILY AS NEEDED FOR ANXIETY., Disp: 90 tablet, Rfl:  1 .  aspirin EC 81 MG tablet, Take 81 mg by mouth daily., Disp: , Rfl:  .  atorvastatin (LIPITOR) 20 MG tablet, Take 1 tablet (20 mg total) by mouth daily., Disp: 90 tablet, Rfl: 3 .  brimonidine (ALPHAGAN) 0.2 % ophthalmic solution, Place 1 drop into both eyes 2 (two) times daily. , Disp: , Rfl:  .  chlorhexidine (PERIDEX) 0.12 % solution, Use as directed 15 mLs in the mouth or throat daily as needed. Gum Diease, Disp: , Rfl:  .  diphenhydrAMINE (BENADRYL) 25 MG tablet, Take 50 mg by mouth every 6 (six) hours as needed for allergies., Disp: , Rfl:  .  dorzolamide-timolol  (COSOPT) 22.3-6.8 MG/ML ophthalmic solution, Place 1 drop into both eyes 2 (two) times daily., Disp: , Rfl:  .  esomeprazole (NEXIUM) 40 MG capsule, Take 1 capsule (40 mg total) by mouth 2 (two) times a day., Disp: 112 capsule, Rfl: 0 .  fluticasone (FLONASE) 50 MCG/ACT nasal spray, Place 1 spray into both nostrils daily as needed for allergies or rhinitis., Disp: , Rfl:  .  labetalol (NORMODYNE) 200 MG tablet, Take 1 tablet (200 mg total) by mouth 2 (two) times daily., Disp: 180 tablet, Rfl: 1 .  methylcellulose oral powder, Take 1 packet by mouth daily., Disp: , Rfl:  .  Psyllium (METAMUCIL FIBER PO), Take by mouth daily. 1 Teaspoon daily, Disp: , Rfl:  .  RHOPRESSA 0.02 % SOLN, Place 1 drop into both eyes daily. , Disp: , Rfl:  .  risperiDONE (RISPERDAL) 0.5 MG tablet, Take 1 tablet (0.5 mg total) by mouth at bedtime., Disp: 30 tablet, Rfl: 1 .  sennosides-docusate sodium (SENOKOT-S) 8.6-50 MG tablet, Take 1 tablet by mouth as needed. , Disp: , Rfl:  .  spironolactone (ALDACTONE) 25 MG tablet, TAKE 1 TABLET (25 MG TOTAL) BY MOUTH EVERY MORNING., Disp: 90 tablet, Rfl: 1 .  sucralfate (CARAFATE) 1 g tablet, TAKE 1 TABLET BY MOUTH TWICE A DAY BEFORE A MEAL, Disp: 60 tablet, Rfl: 1 .  Travoprost, BAK Free, (TRAVATAN) 0.004 % SOLN ophthalmic solution, Place 1 drop into both eyes at bedtime., Disp: , Rfl:  .  traZODone (DESYREL) 100 MG tablet, Take 2 tablets (200 mg total) by mouth at bedtime., Disp: 180 tablet, Rfl: 1 .  verapamil (CALAN-SR) 240 MG CR tablet, Take 1 tablet (240 mg total) by mouth at bedtime., Disp: 90 tablet, Rfl: 1  EXAM:  Patient was laying in bed during this exam, she was able to answer questions appropriately but would often fall asleep.  Did not appear in distress.   ASSESSMENT AND PLAN:  Discussed the following assessment and plan:  1. Dementia with behavioral disturbance, unspecified dementia type (HCC)  - risperiDONE (RISPERDAL) 0.5 MG tablet; Take 1 tablet (0.5 mg  total) by mouth at bedtime.  Dispense: 30 tablet; Refill: 1  2. Insomnia due to medical condition -Vies daughter on weaning patient off trazodone.  Tapering instructions given, once on 50 mg and tapering off can start on Risperdal. - Follow up in 2-3 weeks or sooner if needed - risperiDONE (RISPERDAL) 0.5 MG tablet; Take 1 tablet (0.5 mg total) by mouth at bedtime.  Dispense: 30 tablet; Refill: 1  Dorothyann Peng, NP     I discussed the assessment and treatment plan with the patient. The patient was provided an opportunity to ask questions and all were answered. The patient agreed with the plan and demonstrated an understanding of the instructions.   The patient was advised  to call back or seek an in-person evaluation if the symptoms worsen or if the condition fails to improve as anticipated.   Dorothyann Peng, NP

## 2019-04-04 NOTE — Progress Notes (Signed)
Chief Complaint: GERD, esophageal stricture, PUD  GI Hx: 73 y.o. 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  Transfused 3 units PRBCs for hemoglobin nadir 5.7 with an appropriate response, hemoglobin 8.4 at discharge on 4/23.  EGD on 4/22 notable for 3 proximal duodenal ulcers and one distal gastric ulcer, all nonbleeding and superficial. No high risk stigmata of bleeding requiring endoscopic intervention. Biopsies obtained and demonstrated duodenitis, gastritis, esophagitis, without H. pylori or dysplasia. EGD also notable for a 2 cm hiatal hernia with lower esophageal esophagitis and a presumed peptic stricture, which was dilated with the endoscope alone.  Discharged with Protonix 40 mg p.o. twice daily x8 weeks. Xarelto was stopped indefinitely by Dr. Donzetta Matters a couple days after d/c and started on daily aspirin 81 mg.   Endoscopic history: - EGD (03/2019, Dr. Bryan Lemma): Benign stenosis at 39 cm, easily traversed.  Dilation not performed due to extensive retained food in the stomach.  2 cm HH.  Normal duodenum.  Consider GES -EGD (12/2018, Dr. Bryan Lemma): Stricture at 39 cm, dilated to 15.5 mm TTS balloon, 4 gastric ulcers, with clips x3 and epinephrine.  Biopsies negative for H. pylori -EGD (10/2018, inpatient): LA Grade A esophagitis, GU, DU  HPI:    Due to current restrictions/limitations of in-office visits due to the COVID-19 pandemic, this scheduled clinical appointment was converted to a telehealth virtual consultation using Doximity.  -Time of medical discussion: 25 minutes -The patient did consent to this virtual visit and is aware of possible charges through their insurance for this visit.  -Names of all parties present: Stacy Brennan (patient), Roxan Hockey (daughter), Gerrit Heck, DO, [facility] (physician) -Patient location: Home -Physician location: Office  Stacy Brennan is a 73 y.o. female referred to the Gastroenterology Clinic for routine follow-up.  Last seen by me in 11/2018 after hospitalization in 10/2018 as outlined above.  Repeat EGD in 12/2018 with esophageal stricture, dilated to 15.5 mm TTS, gastric ulcers, treated with clips x3.  Again repeat EGD in 03/2019 with benign stenosis at 39 cm, easily traversed.  Further dilation not performed due to extensive retained food in the stomach.  Today she states  No dysphagia, n/v. Appetite preserved but early satiety per daughter, which has actually been present for the last year or so. Thinks she has altered taste. Drinking Ensure. Regular bowel habits. No hematochezia, melena.   Not sleeping well lately and with weakness, particularly legs and falls. Altered sleep cycle. Had virtual appt with PCM earlier today to discuss dementia and these same complaints- started on risperdone and stopping trazodone. Previously trialed Aricept and Nemenda.   Past medical history, past surgical history, social history, family history, medications, and allergies reviewed in the chart and with patient.    Past Medical History:  Diagnosis Date  . Allergic rhinitis, cause unspecified 01/01/2014  . Anxiety state, unspecified 01/01/2014  . Cataract   . Cognitive impairment   . Dementia ([place])   . Depression 01/01/2014  . Glaucoma   . Hypertension   . Other and unspecified hyperlipidemia 01/01/2014  . Renal insufficiency 01/01/2014  . Stroke [facility]) 2002   CVA x 2  . Unspecified asthma(493.90) 01/01/2014  . Vision decreased      Past Surgical History:  Procedure  Laterality Date  . BIOPSY  11/20/2018   Procedure: BIOPSY;  Surgeon: Lavena Bullion, DO;  Location: [facility];  Service: Gastroenterology;;  . DILATION AND CURETTAGE OF UTERUS    . ESOPHAGOGASTRODUODENOSCOPY (EGD) WITH PROPOFOL N/A 11/20/2018   Procedure:  ESOPHAGOGASTRODUODENOSCOPY (EGD) WITH PROPOFOL;  Surgeon: Lavena Bullion, DO;  [facility];  Service: Gastroenterology;  Laterality: N/A;  . THROMBECTOMY BRACHIAL ARTERY Left 08/30/2018   Procedure: LEFT SUBCLAVIAN ARTERY THROMBECTOMY;  Surgeon: Waynetta Sandy, MD;  Location: [place];  Service: Vascular;  Laterality: Left;  . [place]     Family History  Problem Relation Age of Onset  . Cancer Other        prostate cancer  . Hypertension Other   . Diabetes Other   . Thyroid disease Other   . Hyperlipidemia Other   . Hypertension Other   . Glaucoma Other   . Parkinsonism Neg Hx   . Dementia Neg Hx   . Colon cancer Neg Hx   . Esophageal cancer Neg Hx    Social History   Tobacco Use  . Smoking status: Former Smoker    Quit date: 03/20/2002    Years since quitting: 17.0  . Smokeless tobacco: Former Systems developer    Types: Snuff  Substance Use Topics  . Alcohol use: No  . Drug use: No   Current Outpatient Medications  Medication Sig Dispense Refill  . alendronate (FOSAMAX) 70 MG tablet Take 1 tablet (70 mg total) by mouth once a week. Take with a full glass of water on an empty stomach. (Patient taking differently: Take 70 mg by mouth every Monday. Take with a full glass of water on an empty stomach.) 4 tablet 11  . ALPRAZolam (XANAX) 0.5 MG tablet TAKE 1 TABLET BY MOUTH 3 TIMES DAILY AS NEEDED FOR ANXIETY. 90 tablet 1  . aspirin EC 81 MG tablet Take 81 mg by mouth daily.    Marland Kitchen atorvastatin (LIPITOR) 20 MG tablet Take 1 tablet (20 mg total) by mouth daily. 90 tablet 3  . brimonidine (ALPHAGAN) 0.2 % ophthalmic solution Place 1 drop into both eyes 2 (two) times daily.     . chlorhexidine (PERIDEX) 0.12 % solution Use as directed 15 mLs in the mouth or throat daily as needed. Gum Diease    . Chlorphen-Pseudoephed-APAP (TYLENOL ALLERGY SINUS PO) Take 1 tablet by mouth as needed.    . dorzolamide-timolol (COSOPT) 22.3-6.8 MG/ML ophthalmic solution Place 1 drop into  both eyes 2 (two) times daily.    Marland Kitchen esomeprazole (NEXIUM) 40 MG capsule Take 1 capsule (40 mg total) by mouth 2 (two) times a day. 112 capsule 0  . fluticasone (FLONASE) 50 MCG/ACT nasal spray Place 1 spray into both nostrils daily as needed for allergies or rhinitis.    Marland Kitchen labetalol (NORMODYNE) 200 MG tablet Take 1 tablet (200 mg total) by mouth 2 (two) times daily. 180 tablet 1  . Psyllium (METAMUCIL FIBER PO) Take by mouth daily. 1 Teaspoon daily    . RHOPRESSA 0.02 % SOLN Place 1 drop into both eyes daily.     . sennosides-docusate sodium (SENOKOT-S) 8.6-50 MG tablet Take 1 tablet by mouth as needed.     Marland Kitchen spironolactone (ALDACTONE) 25 MG tablet TAKE 1 TABLET (25 MG TOTAL) BY MOUTH EVERY MORNING. 90 tablet 1  . sucralfate (CARAFATE) 1 g tablet TAKE 1 TABLET BY MOUTH TWICE A DAY BEFORE A MEAL 60 tablet 1  . Travoprost, BAK Free, (TRAVATAN) 0.004 % SOLN  ophthalmic solution Place 1 drop into both eyes at bedtime.    . verapamil (CALAN-SR) 240 MG CR tablet Take 1 tablet (240 mg total) by mouth at bedtime. 90 tablet 1  . risperiDONE (RISPERDAL) 0.5 MG tablet Take 1 tablet (0.5 mg total) by mouth at bedtime. (Patient not taking: Reported on 04/04/2019) 30 tablet 1   No current facility-administered medications for this visit.    Allergies  Allergen Reactions  . Warfarin Palpitations  . Doxycycline Rash  . Aricept [Donepezil Hcl] Other (See Comments)    Insomnia and not eating      Review of Systems: All systems reviewed and negative except where noted in HPI.     Physical Exam:    Complete physical exam not completed due to the nature of this telehealth communication.   Gen: Awake, alert HEENT: EOMI, non-icteric sclera, NCAT, MMM Neck: Normal movement of head and neck Pulm: No labored breathing, speaking in full sentences without conversational dyspnea Derm: No apparent lesions or bruising in visible field MS: Moves all visible extremities without noticeable abnormality Psych:  Pleasant, cooperative, normal speech, thought processing seemingly intact   ASSESSMENT AND PLAN;   1) GERD with Erosive Esophagitis 2) Esophageal stricture 3) PUD 4) Hiatal hernia  -Resume Nexium - No nausea/vomiting.  Not planning on GES at this time.  However if developing further UGI symptoms, can consider performing the study based on food retained in stomach on last EGD.  Otherwise, prior EGDs without food retained - Stopped Carafate previously -Resume p.o. intake as tolerated.  Agree with Ensure, and other calorie rich liquids as tolerated - Will monitor for clinical improvement with change in dementia therapy as implemented earlier today - Repeat hemoglobin in another 2 months   RTC in 3 months or sooner as needed  Lavena Bullion, DO, FACG  04/04/2019, 3:08 PM   Nafziger, Tommi Rumps, NP

## 2019-04-15 ENCOUNTER — Telehealth (INDEPENDENT_AMBULATORY_CARE_PROVIDER_SITE_OTHER): Payer: Medicare Other | Admitting: Adult Health

## 2019-04-15 ENCOUNTER — Other Ambulatory Visit: Payer: Self-pay

## 2019-04-15 ENCOUNTER — Telehealth: Payer: Self-pay

## 2019-04-15 DIAGNOSIS — F0391 Unspecified dementia with behavioral disturbance: Secondary | ICD-10-CM

## 2019-04-15 DIAGNOSIS — G4701 Insomnia due to medical condition: Secondary | ICD-10-CM

## 2019-04-15 DIAGNOSIS — I708 Atherosclerosis of other arteries: Secondary | ICD-10-CM

## 2019-04-15 MED ORDER — RISPERIDONE 1 MG PO TABS: 1.0000 mg | ORAL_TABLET | Freq: Two times a day (BID) | ORAL | 0 refills | Status: DC

## 2019-04-15 NOTE — Progress Notes (Signed)
Virtual Visit via Video Note  I connected with Stacy Brennan  on 04/15/19 at  3:00 PM EDT by a video enabled telemedicine application and verified that I am speaking with the correct person using two identifiers.  Location patient: home Location provider:work or home office Persons participating in the virtual visit: patient, provider, daughter  I discussed the limitations of evaluation and management by telemedicine and the availability of in person appointments. The patient expressed understanding and agreed to proceed.   HPI: 73 year old female who is being evaluated today for follow-up regarding dementia and insomnia.  She was last seen via telemedicine for these issues on 04/04/2019.  At this time her daughter reported that the patient was not sleeping despite taking 200 mg of trazodone.  She would eventually fall asleep early morning and was asleep throughout the day.  Her daughter also reports that she is having visual hallucinations of a small child in the house.  Denied auditory hallucinations.  Her appetite was not good but she continued to eat a evening meal.  She no longer likes the taste of most foods and will not drink Ensure.  Trazodone was weaned and she was started on Risperdal 0.5 mg nightly.  Risperdal was started 4 nights ago, and the daughter reports that the patient has not slept since that time.  Her daughter reports that the patient has been up out of bed more often and is walking around her room throughout the evening.  She continues to have visual hallucinations and her appetite continues to decline.  Her daughter is having a hard time being able to deal with her and help her out.  They are currently looking for placement in a memory care unit.   ROS: See pertinent positives and negatives per HPI.  Past Medical History:  Diagnosis Date  . Allergic rhinitis, cause unspecified 01/01/2014  . Anxiety state, unspecified 01/01/2014  . Cataract   . Cognitive impairment   .  Dementia ([place])   . Depression 01/01/2014  . Glaucoma   . Hypertension   . Other and unspecified hyperlipidemia 01/01/2014  . Renal insufficiency 01/01/2014  . Stroke [facility]) 2002   CVA x 2  . Unspecified asthma(493.90) 01/01/2014  . Vision decreased     Past Surgical History:  Procedure Laterality Date  . BIOPSY  11/20/2018   Procedure: BIOPSY;  Surgeon: Lavena Bullion, DO;  [facility];  Service: Gastroenterology;;  . DILATION AND CURETTAGE OF UTERUS    . ESOPHAGOGASTRODUODENOSCOPY (EGD) WITH PROPOFOL N/A 11/20/2018   Procedure: ESOPHAGOGASTRODUODENOSCOPY (EGD) WITH PROPOFOL;  Surgeon: Lavena Bullion, DO;  [facility];  Service: Gastroenterology;  Laterality: N/A;  . THROMBECTOMY BRACHIAL ARTERY Left 08/30/2018   Procedure: LEFT SUBCLAVIAN ARTERY THROMBECTOMY;  Surgeon: Waynetta Sandy, MD;  Location: [place];  Service: Vascular;  Laterality: Left;  . [place]      Family History  Problem Relation Age of Onset  . Cancer Other        prostate cancer  . Hypertension Other   . Diabetes Other   . Thyroid disease Other   . Hyperlipidemia Other   . Hypertension Other   . Glaucoma Other   . Parkinsonism Neg Hx   . Dementia Neg Hx   . Colon cancer Neg Hx   . Esophageal cancer Neg Hx      Current Outpatient Medications:  .  alendronate (FOSAMAX) 70 MG tablet, Take 1 tablet (70 mg total) by mouth once a week. Take with a  full glass of water on an empty stomach. (Patient taking differently: Take 70 mg by mouth every Monday. Take with a full glass of water on an empty stomach.), Disp: 4 tablet, Rfl: 11 .  ALPRAZolam (XANAX) 0.5 MG tablet, TAKE 1 TABLET BY MOUTH 3 TIMES DAILY AS NEEDED FOR ANXIETY., Disp: 90 tablet, Rfl: 1 .  aspirin EC 81 MG tablet, Take 81 mg by mouth daily., Disp: , Rfl:  .  atorvastatin (LIPITOR) 20 MG tablet, Take 1 tablet (20 mg total) by mouth daily., Disp: 90 tablet, Rfl: 3 .  brimonidine (ALPHAGAN) 0.2 % ophthalmic solution,  Place 1 drop into both eyes 2 (two) times daily. , Disp: , Rfl:  .  chlorhexidine (PERIDEX) 0.12 % solution, Use as directed 15 mLs in the mouth or throat daily as needed. Gum Diease, Disp: , Rfl:  .  Chlorphen-Pseudoephed-APAP (TYLENOL ALLERGY SINUS PO), Take 1 tablet by mouth as needed., Disp: , Rfl:  .  dorzolamide-timolol (COSOPT) 22.3-6.8 MG/ML ophthalmic solution, Place 1 drop into both eyes 2 (two) times daily., Disp: , Rfl:  .  esomeprazole (NEXIUM) 40 MG capsule, Take 1 capsule (40 mg total) by mouth 2 (two) times a day., Disp: 112 capsule, Rfl: 0 .  fluticasone (FLONASE) 50 MCG/ACT nasal spray, Place 1 spray into both nostrils daily as needed for allergies or rhinitis., Disp: , Rfl:  .  labetalol (NORMODYNE) 200 MG tablet, Take 1 tablet (200 mg total) by mouth 2 (two) times daily., Disp: 180 tablet, Rfl: 1 .  Psyllium (METAMUCIL FIBER PO), Take by mouth daily. 1 Teaspoon daily, Disp: , Rfl:  .  RHOPRESSA 0.02 % SOLN, Place 1 drop into both eyes daily. , Disp: , Rfl:  .  risperiDONE (RISPERDAL) 0.5 MG tablet, Take 1 tablet (0.5 mg total) by mouth at bedtime. (Patient not taking: Reported on 04/04/2019), Disp: 30 tablet, Rfl: 1 .  sennosides-docusate sodium (SENOKOT-S) 8.6-50 MG tablet, Take 1 tablet by mouth as needed. , Disp: , Rfl:  .  spironolactone (ALDACTONE) 25 MG tablet, TAKE 1 TABLET (25 MG TOTAL) BY MOUTH EVERY MORNING., Disp: 90 tablet, Rfl: 1 .  sucralfate (CARAFATE) 1 g tablet, TAKE 1 TABLET BY MOUTH TWICE A DAY BEFORE A MEAL, Disp: 60 tablet, Rfl: 1 .  Travoprost, BAK Free, (TRAVATAN) 0.004 % SOLN ophthalmic solution, Place 1 drop into both eyes at bedtime., Disp: , Rfl:  .  verapamil (CALAN-SR) 240 MG CR tablet, Take 1 tablet (240 mg total) by mouth at bedtime., Disp: 90 tablet, Rfl: 1  EXAM:  VITALS per patient if applicable:  GENERAL: patient alert but is unable to answer complex questions. She appears sickly and tired   HEENT: atraumatic, conjunttiva clear, no obvious  abnormalities on inspection of external nose and ears  NECK: normal movements of the head and neck  LUNGS: on inspection no signs of respiratory distress, breathing rate appears normal, no obvious gross SOB, gasping or wheezing  CV: no obvious cyanosis  MS: moves all visible extremities without noticeable abnormality  PSYCH/NEURO: pleasant and cooperative, no obvious depression or anxiety, speech and thought processing grossly intact  ASSESSMENT AND PLAN:  Discussed the following assessment and plan:  1. Dementia with behavioral disturbance, unspecified dementia type ([place]) -Patient's daughter tearful and appears exhausted.  Patient does not look well.  I am going to refer to hospice care for nearing end-of-life, the daughter is okay with this.  Increase Risperdal from 0.5 mg nightly to 1 mg twice daily.  Follow-up in  2 to 3 weeks or sooner if needed - risperiDONE (RISPERDAL) 1 MG tablet; Take 1 tablet (1 mg total) by mouth 2 (two) times daily.  Dispense: 60 tablet; Refill: 0  2. Insomnia due to medical condition  - risperiDONE (RISPERDAL) 1 MG tablet; Take 1 tablet (1 mg total) by mouth 2 (two) times daily.  Dispense: 60 tablet; Refill: 0   I discussed the assessment and treatment plan with the patient. The patient was provided an opportunity to ask questions and all were answered. The patient agreed with the plan and demonstrated an understanding of the instructions.   The patient was advised to call back or seek an in-person evaluation if the symptoms worsen or if the condition fails to improve as anticipated.   Dorothyann Peng, NP

## 2019-04-15 NOTE — Telephone Encounter (Signed)
Copied from [place] [id number]. Topic: General - Inquiry >> Apr 15, 2019  3:25 PM Reyne Dumas L wrote: Reason for CRM:  Pt's son in law, Dub Amis, calling.  States that he needs a work note for himself so that he can be allowed to work from home because he is pt's caregiver. Shade Flood can be reached at [phone]

## 2019-04-15 NOTE — Telephone Encounter (Signed)
He will need to contact his PCP. I see his mother in law but cannot write a note for him

## 2019-04-16 ENCOUNTER — Telehealth: Payer: Self-pay | Admitting: Adult Health

## 2019-04-16 NOTE — Telephone Encounter (Signed)
Pt daughter Joelene Millin is calling to let cory know the risperidone 1 mg is not working. The med was increase yesterday. [facility] [address] . Pt lips are dry and daughter is sure her mother is  Dehydrated. Please advise. Pt daughter said cory is aware her mother is not drinking etc

## 2019-04-17 ENCOUNTER — Other Ambulatory Visit: Payer: Self-pay | Admitting: Adult Health

## 2019-04-17 MED ORDER — LORAZEPAM 0.5 MG PO TABS: 0.5000 mg | ORAL_TABLET | Freq: Every day | ORAL | 0 refills | Status: DC

## 2019-04-17 NOTE — Telephone Encounter (Signed)
It will take longer than one day to get into her system and start working should start to notice in a few days.   I will send in a short course of Ativan that she can take at night to help get her to fall asleep.   Try to force fluids as much as possible. If she will not drink then she will likely need IV fluids.

## 2019-04-17 NOTE — Telephone Encounter (Signed)
Discussed notes per [place] with Joelene Millin. She expressed understanding. Nothing further needed.

## 2019-04-17 NOTE — Telephone Encounter (Signed)
Also, please do not have her take Xanax with Ativan at night

## 2019-04-18 ENCOUNTER — Telehealth: Payer: Self-pay

## 2019-04-18 NOTE — Telephone Encounter (Signed)
Caller advised he will need to contact his PCP for completion. Message sent to Dr. Ronnald Ramp office.

## 2019-04-18 NOTE — Telephone Encounter (Signed)
Spoke to Dr. Tomasa Hosteller with Hospice. Stacy Brennan was evaluated yesterday and does not yet meet criteria for Hospice. She does meet criteria for palliative care. They will start the process of Palliative Care

## 2019-04-18 NOTE — Telephone Encounter (Signed)
Copied from [place] [id number]. Topic: General - Other >> Apr 17, 2019  4:38 PM Yvette Rack wrote: Reason for CRM: Dr. Tomasa Hosteller with [facility] request that Stacy Brennan return his call at [phone]

## 2019-04-21 ENCOUNTER — Telehealth (INDEPENDENT_AMBULATORY_CARE_PROVIDER_SITE_OTHER): Payer: Medicare Other | Admitting: Family Medicine

## 2019-04-21 ENCOUNTER — Other Ambulatory Visit: Payer: Self-pay

## 2019-04-21 ENCOUNTER — Encounter: Payer: Self-pay | Admitting: Family Medicine

## 2019-04-21 DIAGNOSIS — F0391 Unspecified dementia with behavioral disturbance: Secondary | ICD-10-CM | POA: Diagnosis not present

## 2019-04-21 DIAGNOSIS — I708 Atherosclerosis of other arteries: Secondary | ICD-10-CM | POA: Diagnosis not present

## 2019-04-21 DIAGNOSIS — G4700 Insomnia, unspecified: Secondary | ICD-10-CM

## 2019-04-21 MED ORDER — LORAZEPAM 1 MG PO TABS: 1.0000 mg | ORAL_TABLET | Freq: Every day | ORAL | 0 refills | Status: DC

## 2019-04-21 NOTE — Progress Notes (Signed)
Virtual Visit via Video Note  I connected with the patient on 04/21/19 at  4:15 PM EDT by a video enabled telemedicine application and verified that I am speaking with the correct person using two identifiers.  Location patient: home Location provider:work or home office Persons participating in the virtual visit: patient, provider  I discussed the limitations of evaluation and management by telemedicine and the availability of in person appointments. The patient expressed understanding and agreed to proceed.   HPI: Here asking for help with sleep. I spoke to her daughter while the patient sat beside her and listened. She sees Film/video editor for primary care, and she has advancing dementia with behavioral disturbances and visual hallucinations. She also has insomnia which has been difficult to treat. Her daughter is at her wits end from exhaustion and her own lack of sleep. [place] has been tried on Trazodone with doses up to 200 mg with no benefit. Then she was tried on Risperdol 0.5 mg at bedtime, again with no benefit. She had a VV with [name] on 04-15-19 and he decided to increase the Risperdol to a full 1 mg dose and to give this BID. Apparently the daughter misunderstood and never picked up this prescription. Tommi Rumps also gave [place] Ativan 0.5 mg at bedtime, and her daughter says this actually helped her to fall asleep, but it only lasts 2-3 hours until she wakes up. The daughter asks if the Ativan can be increased. Of note she was referred to Hospice, and they did meet with the family last week for an intake interview. They will be coming back this week to set up a full care plan, and the daughter is very excited to have some help now.    ROS: See pertinent positives and negatives per HPI.  Past Medical History:  Diagnosis Date  . Allergic rhinitis, cause unspecified 01/01/2014  . Anxiety state, unspecified 01/01/2014  . Cataract   . Cognitive impairment   . Dementia ([place])   . Depression  01/01/2014  . Glaucoma   . Hypertension   . Other and unspecified hyperlipidemia 01/01/2014  . Renal insufficiency 01/01/2014  . Stroke [facility]) 2002   CVA x 2  . Unspecified asthma(493.90) 01/01/2014  . Vision decreased     Past Surgical History:  Procedure Laterality Date  . BIOPSY  11/20/2018   Procedure: BIOPSY;  Surgeon: Lavena Bullion, DO;  Location: [facility];  Service: Gastroenterology;;  . DILATION AND CURETTAGE OF UTERUS    . ESOPHAGOGASTRODUODENOSCOPY (EGD) WITH PROPOFOL N/A 11/20/2018   Procedure: ESOPHAGOGASTRODUODENOSCOPY (EGD) WITH PROPOFOL;  Surgeon: Lavena Bullion, DO;  Location: [facility];  Service: Gastroenterology;  Laterality: N/A;  . THROMBECTOMY BRACHIAL ARTERY Left 08/30/2018   Procedure: LEFT SUBCLAVIAN ARTERY THROMBECTOMY;  Surgeon: Waynetta Sandy, MD;  Location: [place];  Service: Vascular;  Laterality: Left;  . [place]      Family History  Problem Relation Age of Onset  . Cancer Other        prostate cancer  . Hypertension Other   . Diabetes Other   . Thyroid disease Other   . Hyperlipidemia Other   . Hypertension Other   . Glaucoma Other   . Parkinsonism Neg Hx   . Dementia Neg Hx   . Colon cancer Neg Hx   . Esophageal cancer Neg Hx      Current Outpatient Medications:  .  alendronate (FOSAMAX) 70 MG tablet, Take 1 tablet (70 mg total) by mouth once a week. Take with  a full glass of water on an empty stomach. (Patient taking differently: Take 70 mg by mouth every Monday. Take with a full glass of water on an empty stomach.), Disp: 4 tablet, Rfl: 11 .  ALPRAZolam (XANAX) 0.5 MG tablet, TAKE 1 TABLET BY MOUTH 3 TIMES DAILY AS NEEDED FOR ANXIETY., Disp: 90 tablet, Rfl: 1 .  aspirin EC 81 MG tablet, Take 81 mg by mouth daily., Disp: , Rfl:  .  atorvastatin (LIPITOR) 20 MG tablet, Take 1 tablet (20 mg total) by mouth daily., Disp: 90 tablet, Rfl: 3 .  brimonidine (ALPHAGAN) 0.2 % ophthalmic solution, Place 1 drop into both eyes 2  (two) times daily. , Disp: , Rfl:  .  chlorhexidine (PERIDEX) 0.12 % solution, Use as directed 15 mLs in the mouth or throat daily as needed. Gum Diease, Disp: , Rfl:  .  Chlorphen-Pseudoephed-APAP (TYLENOL ALLERGY SINUS PO), Take 1 tablet by mouth as needed., Disp: , Rfl:  .  dorzolamide-timolol (COSOPT) 22.3-6.8 MG/ML ophthalmic solution, Place 1 drop into both eyes 2 (two) times daily., Disp: , Rfl:  .  esomeprazole (NEXIUM) 40 MG capsule, Take 1 capsule (40 mg total) by mouth 2 (two) times a day., Disp: 112 capsule, Rfl: 0 .  fluticasone (FLONASE) 50 MCG/ACT nasal spray, Place 1 spray into both nostrils daily as needed for allergies or rhinitis., Disp: , Rfl:  .  labetalol (NORMODYNE) 200 MG tablet, Take 1 tablet (200 mg total) by mouth 2 (two) times daily., Disp: 180 tablet, Rfl: 1 .  LORazepam (ATIVAN) 1 MG tablet, Take 1 tablet (1 mg total) by mouth at bedtime., Disp: 30 tablet, Rfl: 0 .  Psyllium (METAMUCIL FIBER PO), Take by mouth daily. 1 Teaspoon daily, Disp: , Rfl:  .  RHOPRESSA 0.02 % SOLN, Place 1 drop into both eyes daily. , Disp: , Rfl:  .  risperiDONE (RISPERDAL) 1 MG tablet, Take 1 tablet (1 mg total) by mouth 2 (two) times daily., Disp: 60 tablet, Rfl: 0 .  sennosides-docusate sodium (SENOKOT-S) 8.6-50 MG tablet, Take 1 tablet by mouth as needed. , Disp: , Rfl:  .  spironolactone (ALDACTONE) 25 MG tablet, TAKE 1 TABLET (25 MG TOTAL) BY MOUTH EVERY MORNING., Disp: 90 tablet, Rfl: 1 .  sucralfate (CARAFATE) 1 g tablet, TAKE 1 TABLET BY MOUTH TWICE A DAY BEFORE A MEAL, Disp: 60 tablet, Rfl: 1 .  Travoprost, BAK Free, (TRAVATAN) 0.004 % SOLN ophthalmic solution, Place 1 drop into both eyes at bedtime., Disp: , Rfl:  .  verapamil (CALAN-SR) 240 MG CR tablet, Take 1 tablet (240 mg total) by mouth at bedtime., Disp: 90 tablet, Rfl: 1  EXAM:  VITALS per patient if applicable:  GENERAL: alert, oriented, appears well and in no acute distress  HEENT: atraumatic, conjunttiva clear, no  obvious abnormalities on inspection of external nose and ears  NECK: normal movements of the head and neck  LUNGS: on inspection no signs of respiratory distress, breathing rate appears normal, no obvious gross SOB, gasping or wheezing  CV: no obvious cyanosis  MS: moves all visible extremities without noticeable abnormality  PSYCH/NEURO: pleasant and cooperative, no obvious depression or anxiety, speech and thought processing grossly intact  ASSESSMENT AND PLAN: Dementia with insomnia. I encouraged them to pick up the new prescription for Risperdol 1 mg BID. I suggested she dose this at breakfast and at supper. We will also increase Ativan to a full 1 mg at bedtime. Recheck prn. We spent 25 minutes discussing these issues.  Alysia Penna, MD  Discussed the following assessment and plan:  No diagnosis found.     I discussed the assessment and treatment plan with the patient. The patient was provided an opportunity to ask questions and all were answered. The patient agreed with the plan and demonstrated an understanding of the instructions.   The patient was advised to call back or seek an in-person evaluation if the symptoms worsen or if the condition fails to improve as anticipated.

## 2019-04-23 ENCOUNTER — Telehealth: Payer: Self-pay | Admitting: Internal Medicine

## 2019-04-23 ENCOUNTER — Telehealth: Payer: Self-pay | Admitting: Adult Health

## 2019-04-23 NOTE — Telephone Encounter (Signed)
Stacy Brennan dropped off a form to have completed  Call for pick up at [phone]  Disposition: Dr's folder

## 2019-04-23 NOTE — Telephone Encounter (Signed)
Called patient to schedule the Palliative Consult, no answer.  Left message with reason for call along with my contact information °

## 2019-04-23 NOTE — Telephone Encounter (Signed)
Rec'd call back from daughter Rosine Beat and she was in agreement with Palliative services.  I have scheduled a Telephone Consult for 04/29/19 @ 3:30 PM.

## 2019-04-29 ENCOUNTER — Other Ambulatory Visit: Payer: Medicare Other | Admitting: Internal Medicine

## 2019-04-29 ENCOUNTER — Encounter: Payer: Self-pay | Admitting: Internal Medicine

## 2019-04-29 ENCOUNTER — Other Ambulatory Visit: Payer: Self-pay

## 2019-04-29 DIAGNOSIS — Z515 Encounter for palliative care: Secondary | ICD-10-CM | POA: Diagnosis not present

## 2019-04-29 NOTE — Progress Notes (Signed)
Sept 29th, 2020 [facility] Palliative Care Consult Note Telephone: [phone]  Fax: [phone]  Due to the current COVID-19 infection/crises, the family prefer, and have given their verbal consent for, a provider visit via telemedicine. HIPPA policies of confidentially were discussed. Video-audio (telehealth) contact was unable to be done due technical barriers from the patient's side.  PATIENT NAME: Stacy Brennan DOB: June 06, 1946 MRN: [id number] Stacy Brennan Phone: [phone]  PRIMARY CARE PROVIDER:   Dorothyann Peng, NP  REFERRING PROVIDER:  Dorothyann Peng, NP [place],   [id number]   RESPONSIBLE PARTY: (daughter) Rosine Beat [phone]  [facility] / RECOMMENDATIONS:  1. Advance Care Planning: a. Directives: Not addressed this visit b. Goals of Care: Symptom management of insomnia, agitation. Hoping for eventual placement in memory care facility.   3. Symptom management:  A. Agitation/Restlessness d/t underlying dementia with behavioral disturbances.  FAST 6e. Patient oriented to self only. Constantly confused. Believes her daughter/PCG is her sister. Over the previous 3 months demonstrates increased agitation/restlessness. She was started on Trazadone with SE hypersomnolence/lethargy "she couldn't get out of bed". Trazadone discontinued and she was initiated on Risperdal with gradual dose increase to 1mg  bid. Over this month patient with insomnia, not sleeping "for days" and keeping patient's daughter and son-in-law awake as well. Her Xanax (0.5mg  tid prn) was changed to Ativan with dose titration up to 1mg  qhs. Daughter notes some improvement in agitation after Risperdal, but Ativan seems to cause increase in restlessness. Patient experiencing progressive frequency of visual and tactile hallucinations; starting to have things on her like spiders / bugs; children. Seeing snakes,  and items crawling on the walls. Wandering behavior is placing patient at an increased safety risk for tripping and elopement.  She has been making attempts to leave the home through the window. She doesn't try to go out the door, however. Daughter thinks this is b/c patient remembers there is a sensor alarm there. Patient keeps mentioning she needs to "go home". Will spend the night packing all her items up b/c "someone is on the way to pick her up".  -Start Phenobarbital 64.8mg  po bid for sedation effect. Hopefully as patient gets some rest she may have decreased signs of delirium. Do to the late time of day, I was unable to reach PCP for approval. I will reach out to [place] tomorrow to discuss. In the meantime, wrote script for #6 tabs.      -Consider gradual dose reduction of Ativan; decrease to 0.5mg  qhs, starting tonight.    B. Weight loss: Just eating bites of food; she has the sensation that her food is excessively salted. This has been ongoing for the last year but getting worse. It's now a struggle for her daughter to get her to drink ensure or water. No recent weights but currently her clothes are very loose and big on her. Today drank 2 ensure. Yesterday had 3 chicken nuggets and took her all day to eat these. Two weeks ago her weight was 150 lbs. At a height of 5'7", her BMI is 23.5 kg/m2.    C. Constipation: Moving bowels with some straining, about once a week. Daughter had thought this was okay b/c patient was eating so little.  -Start Senokot qhs; titrate dose for bowel movement qd to qod. Push fluids as able.   3. Functional status: Independent in transfers; ambulates with walker. Inappropriate dress selection (multi layers of shirts/under ware/etc; backwards). Can  wash her face otherwise dependent for personal hygiene. Developing incomitance of bowel and bladder.  4. Family Supports/Coping: Patient has been residing with her daughter [name] and son in law for 11 years. They would like  to place patient in memory care facility ("[place]  [place] form I the process of being completed by PCP. Daughter needs to complete further paperwork at the county level, but "is too tired to do this". [name] is grieving the loss of her mom's cognitive ability; feels she has lost her best friend.   5. F/U: TC in a few days to check on response to Phenobarbital; hopefully will have aided patient to get some sleep. F/U constipation.  HISTORY OF PRESENT ILLNESS:  Stacy Brennan is a 73 y.o. female with h/o  Alzheimer's Dementia (w/behavioral disturbances), HTN, CKD stage 3, DVT, glaucoma, GIB. Recent evaluation for hospice services (9/17/202); thought non-eligible at that time as prognosis believed to be greater than 6 months. Palliative Care was asked to help address goals of care.   I spent 90 minutes providing this consultation from 3:30pm to 5pm. More than 50% of the time in this consultation was spent coordinating communication.   CODE STATUS: Not addressed  PPS: 40%  HOSPICE ELIGIBILITY/DIAGNOSIS: TBD  PAST MEDICAL HISTORY:  Past Medical History:  Diagnosis Date  . Allergic rhinitis, cause unspecified 01/01/2014  . Anxiety state, unspecified 01/01/2014  . Cataract   . Cognitive impairment   . Dementia ([place])   . Depression 01/01/2014  . Glaucoma   . Hypertension   . Other and unspecified hyperlipidemia 01/01/2014  . Renal insufficiency 01/01/2014  . Stroke [facility]) 2002   CVA x 2  . Unspecified asthma(493.90) 01/01/2014  . Vision decreased     SOCIAL HX:  Social History   Tobacco Use  . Smoking status: Former Smoker    Quit date: 03/20/2002    Years since quitting: 17.1  . Smokeless tobacco: Former Systems developer    Types: Snuff  Substance Use Topics  . Alcohol use: No    ALLERGIES:  Allergies  Allergen Reactions  . Warfarin Palpitations  . Doxycycline Rash  . Aricept [Donepezil Hcl] Other (See Comments)    Insomnia and not eating      PERTINENT MEDICATIONS:   Outpatient Encounter Medications as of 04/29/2019  Medication Sig  . alendronate (FOSAMAX) 70 MG tablet Take 1 tablet (70 mg total) by mouth once a week. Take with a full glass of water on an empty stomach. (Patient taking differently: Take 70 mg by mouth every Monday. Take with a full glass of water on an empty stomach.)  . ALPRAZolam (XANAX) 0.5 MG tablet TAKE 1 TABLET BY MOUTH 3 TIMES DAILY AS NEEDED FOR ANXIETY. (Patient taking differently: Patient not taking for now, d/t taking Ativan)  . aspirin EC 81 MG tablet Take 81 mg by mouth daily.  Marland Kitchen atorvastatin (LIPITOR) 20 MG tablet Take 1 tablet (20 mg total) by mouth daily.  . brimonidine (ALPHAGAN) 0.2 % ophthalmic solution Place 1 drop into both eyes 2 (two) times daily.   . chlorhexidine (PERIDEX) 0.12 % solution Use as directed 15 mLs in the mouth or throat daily as needed. Gum Diease  . dorzolamide-timolol (COSOPT) 22.3-6.8 MG/ML ophthalmic solution Place 1 drop into both eyes 2 (two) times daily.  Marland Kitchen esomeprazole (NEXIUM) 40 MG capsule Take 1 capsule (40 mg total) by mouth 2 (two) times a day.  . labetalol (NORMODYNE) 200 MG tablet Take 1 tablet (  200 mg total) by mouth 2 (two) times daily.  Marland Kitchen LORazepam (ATIVAN) 1 MG tablet Take 1 tablet (1 mg total) by mouth at bedtime. (Patient taking differently: Take 1 mg by mouth at bedtime. Trying gradual dose reduction d/t possible paradoxical agitation)  . PHENobarbital (LUMINAL) 64.8 MG tablet Take 64.8 mg by mouth 2 (two) times daily.  . Psyllium (METAMUCIL FIBER PO) Take by mouth daily. 1 Teaspoon daily  . RHOPRESSA 0.02 % SOLN Place 1 drop into both eyes daily.   . risperiDONE (RISPERDAL) 1 MG tablet Take 1 tablet (1 mg total) by mouth 2 (two) times daily.  . sennosides-docusate sodium (SENOKOT-S) 8.6-50 MG tablet Take 1 tablet by mouth as needed.   Marland Kitchen spironolactone (ALDACTONE) 25 MG tablet TAKE 1 TABLET (25 MG TOTAL) BY MOUTH EVERY MORNING.  . Travoprost, BAK Free, (TRAVATAN) 0.004 % SOLN  ophthalmic solution Place 1 drop into both eyes at bedtime.  . verapamil (CALAN-SR) 240 MG CR tablet Take 1 tablet (240 mg total) by mouth at bedtime.  . Chlorphen-Pseudoephed-APAP (TYLENOL ALLERGY SINUS PO) Take 1 tablet by mouth as needed. Holding for now setting of agitation.  . fluticasone (FLONASE) 50 MCG/ACT nasal spray Place 1 spray into both nostrils daily as needed for allergies or rhinitis. Not taking conistently  . [DISCONTINUED] sucralfate (CARAFATE) 1 g tablet TAKE 1 TABLET BY MOUTH TWICE A DAY BEFORE A MEAL   No facility-administered encounter medications on file as of 04/29/2019.     PHYSICAL EXAM:   Deferred d/t telephonic only nature of tele-health visit.  Julianne Handler, NP

## 2019-04-30 ENCOUNTER — Other Ambulatory Visit: Payer: Self-pay | Admitting: Family Medicine

## 2019-05-01 ENCOUNTER — Telehealth: Payer: Self-pay | Admitting: Internal Medicine

## 2019-05-01 NOTE — Telephone Encounter (Signed)
05/01/2019: 10:30am  TC to patient's daughter [name] [phone]) to check if initiation of Phenobarbital 64.8mg  bid was helping with patient's agitation and marked insomnia. [name] reports that there is some improvement; patient able to rest about 2.5 hrs after each dose, but that is seems like her "mind is still racing".  [name] has to initially lay down with patient and sooth her before patient gets to sleep, and then patient up after about 2.5 hr. Will double evening dose of  Phenobarbital to 129.6mg , keep morning dose at 64.8. Monitor for response. Script for phenobarbital 64.8 mg # 68 [facility].  Violeta Gelinas NP-C [phone]

## 2019-05-02 ENCOUNTER — Telehealth: Payer: Self-pay | Admitting: Internal Medicine

## 2019-05-02 NOTE — Telephone Encounter (Signed)
11am:  TC to daughter [name]. Kim reports patient with improvement in symptoms of insomnia on increased dose of evening phenobarbital (129.6mg ). Continues morning dose at 64.8mg . Patient received dose at 815pm last night; fell asleep at 1am but able to sleep for 5 hours. Family much relieved. They are okay with continuing this regimen. My associated Gonzella Lex NP will continue to see this patient for future visits.  Violeta Gelinas NP-C [phone]

## 2019-05-02 NOTE — Telephone Encounter (Signed)
Forms Completed and waiting on signature.

## 2019-05-02 NOTE — Telephone Encounter (Signed)
Kim notified that paper work is ready for pick up.  Requested it be sent by mail.  Copy sent to scan.  Nothing further needed.

## 2019-05-05 ENCOUNTER — Other Ambulatory Visit: Payer: Self-pay | Admitting: Adult Health

## 2019-05-06 NOTE — Telephone Encounter (Signed)
Sent to the pharmacy by e-scribe for 90 days.  Pt has upcoming cpx. 

## 2019-05-09 ENCOUNTER — Other Ambulatory Visit: Payer: Self-pay | Admitting: Adult Health

## 2019-05-09 DIAGNOSIS — G4701 Insomnia due to medical condition: Secondary | ICD-10-CM

## 2019-05-09 DIAGNOSIS — F0391 Unspecified dementia with behavioral disturbance: Secondary | ICD-10-CM

## 2019-05-12 DIAGNOSIS — H401133 Primary open-angle glaucoma, bilateral, severe stage: Secondary | ICD-10-CM | POA: Diagnosis not present

## 2019-05-12 DIAGNOSIS — H04123 Dry eye syndrome of bilateral lacrimal glands: Secondary | ICD-10-CM | POA: Diagnosis not present

## 2019-05-12 DIAGNOSIS — I739 Peripheral vascular disease, unspecified: Secondary | ICD-10-CM | POA: Diagnosis not present

## 2019-05-12 DIAGNOSIS — L84 Corns and callosities: Secondary | ICD-10-CM | POA: Diagnosis not present

## 2019-05-12 DIAGNOSIS — B351 Tinea unguium: Secondary | ICD-10-CM | POA: Diagnosis not present

## 2019-05-12 DIAGNOSIS — L603 Nail dystrophy: Secondary | ICD-10-CM | POA: Diagnosis not present

## 2019-05-13 ENCOUNTER — Other Ambulatory Visit: Payer: Self-pay | Admitting: Gastroenterology

## 2019-05-14 ENCOUNTER — Telehealth: Payer: Self-pay | Admitting: Family Medicine

## 2019-05-14 NOTE — Telephone Encounter (Signed)
[place] for order  Dx: gait instability

## 2019-05-14 NOTE — Telephone Encounter (Signed)
[place] for order?  Need dx code.

## 2019-05-14 NOTE — Telephone Encounter (Signed)
Copied from [place] [id number]. Topic: General - Other >> May 14, 2019 10:43 AM Carolyn Stare wrote: [facility] req that the office fax over the RX for walking cane, rolling  walker with seat   Fax [phone]

## 2019-05-15 ENCOUNTER — Encounter: Payer: Self-pay | Admitting: Family Medicine

## 2019-05-15 NOTE — Telephone Encounter (Signed)
Order submitted by fax.  Nothing further needed.

## 2019-05-28 ENCOUNTER — Other Ambulatory Visit: Payer: Self-pay | Admitting: Family Medicine

## 2019-05-28 IMAGING — CT CT ANGIO EXTREM UP*L*
2 of 7 series · 13 of 46 positions shown, 15 images · IV contrast (APPLIED)
Comparison: None.

CLINICAL DATA: Left upper extremity weakness with weak radial pulse
on the left. Fever yesterday. Numbness and tingling of the left
hand.

EXAM:
CT ANGIOGRAPHY OF THE left upperEXTREMITY
TECHNIQUE: Multidetector CT imaging of the left upperwas performed using the
standard protocol during bolus administration of intravenous
contrast. Multiplanar CT image reconstructions and MIPs were
obtained to evaluate the vascular anatomy.
CONTRAST:  80mL [id number] IOPAMIDOL ([id number]) INJECTION 76%

[Series 5: upper ext cta 3.0 i30f 3 · axial · 0.62mm/px · z∈[-712,+2]mm · 10 of 276 slices shown, 12 images]
[im 19/276  soft-tissue]
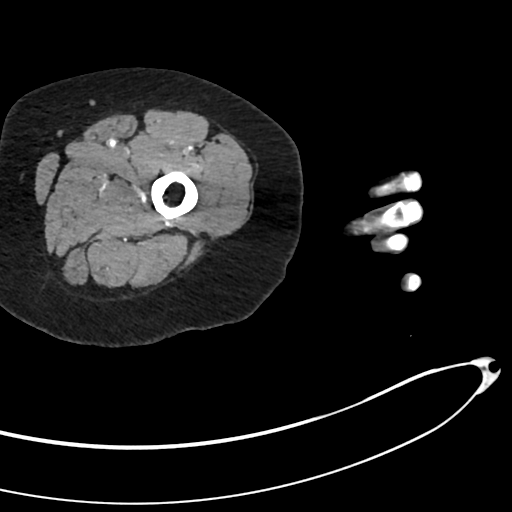
[im 19/276  bone]
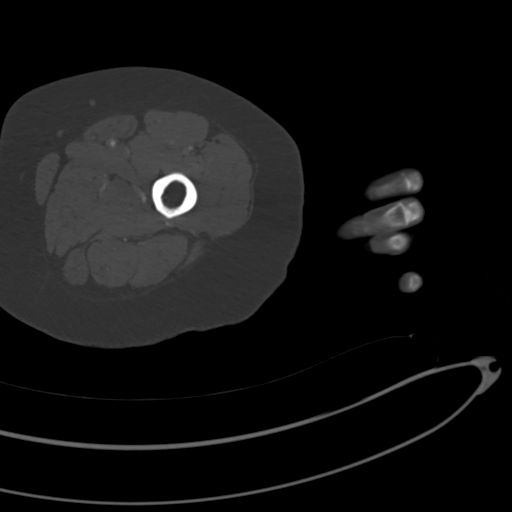
[im 48/276  soft-tissue]
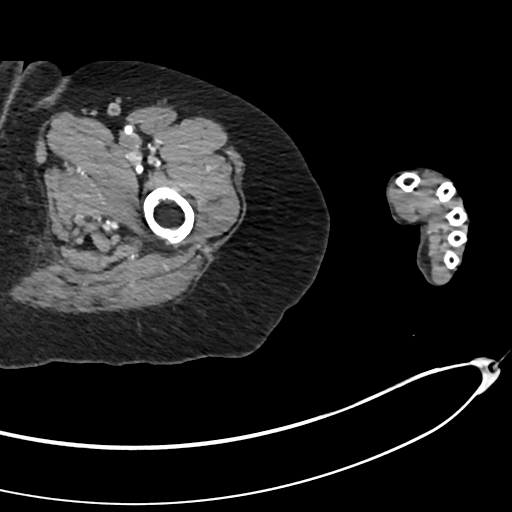
[im 76/276  soft-tissue]
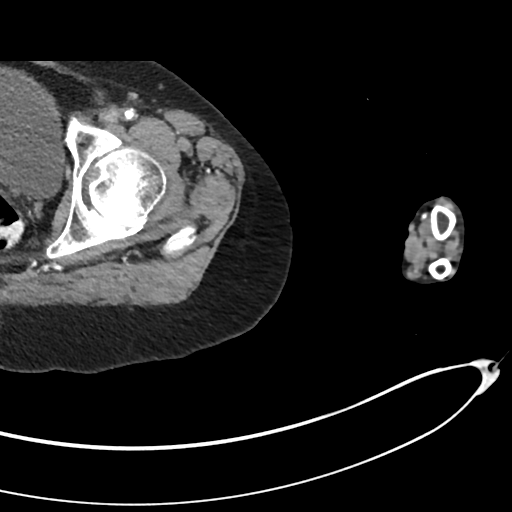
[im 95/276  soft-tissue]
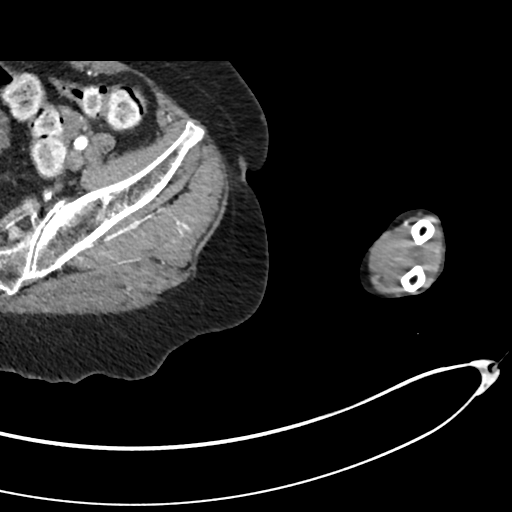
[im 124/276  soft-tissue]
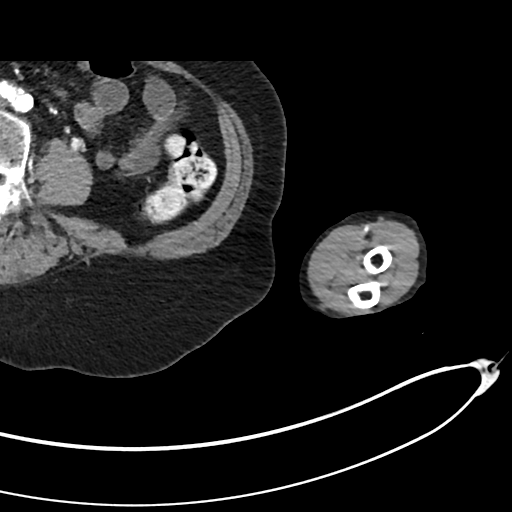
[im 152/276  soft-tissue]
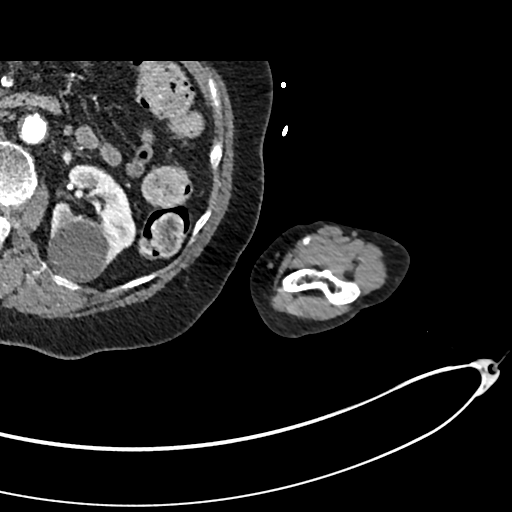
[im 181/276  soft-tissue]
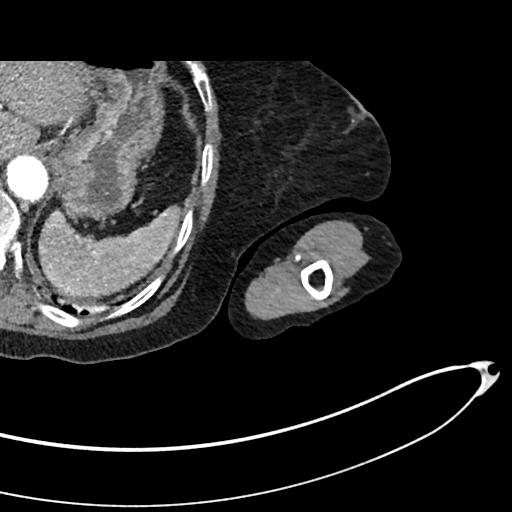
[im 209/276  soft-tissue]
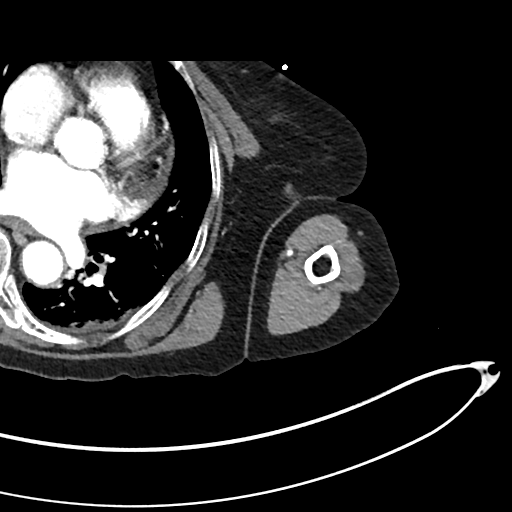
[im 228/276  soft-tissue]
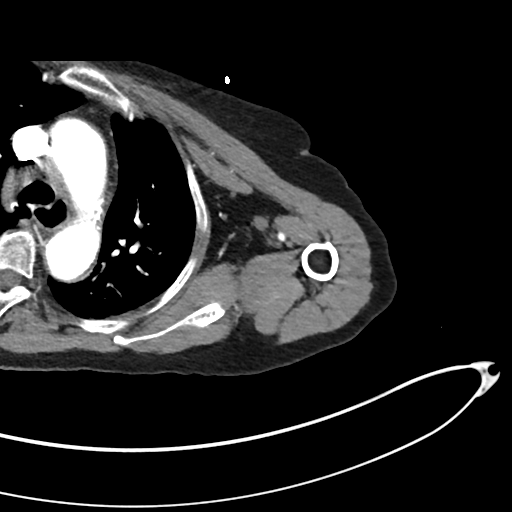
[im 228/276  bone]
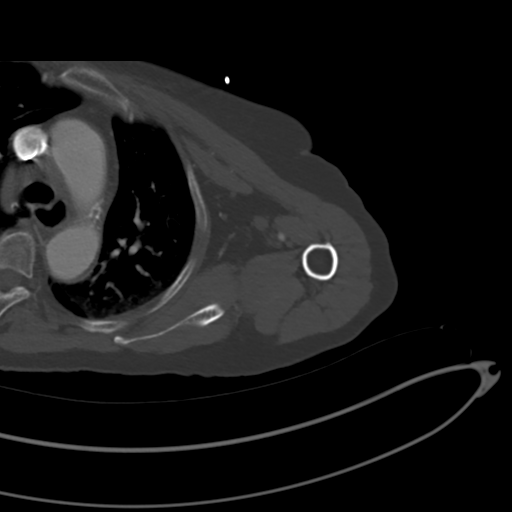
[im 257/276  soft-tissue]
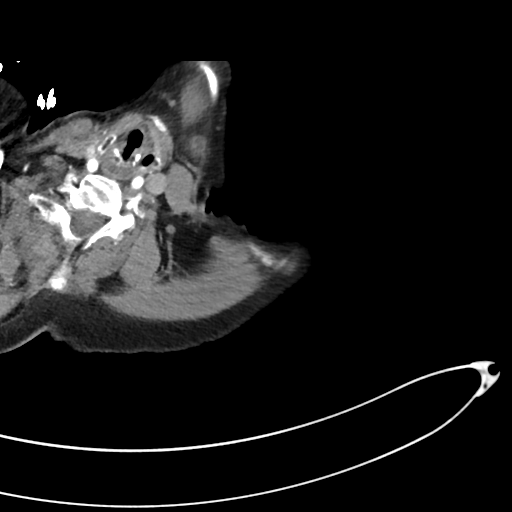

[Series 7: coronals · coronal · 0.69mm/px · 3 of 150 slices shown]
[im 38/150  soft-tissue]
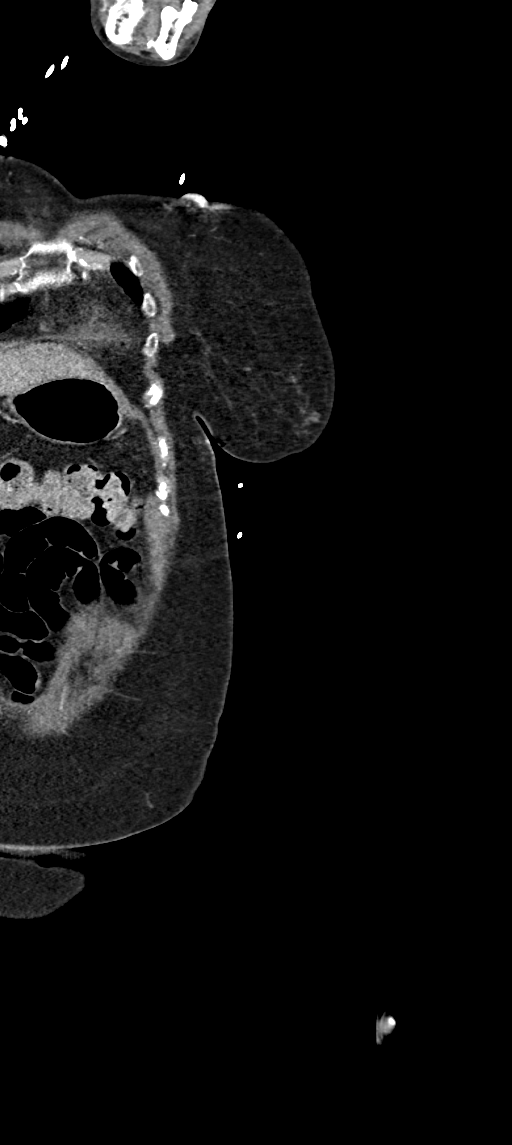
[im 75/150  soft-tissue]
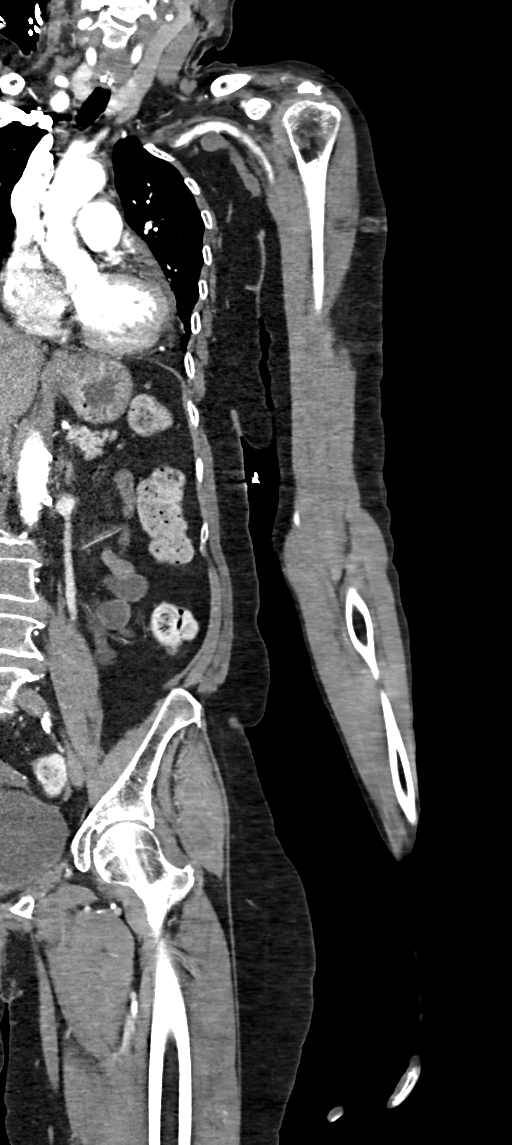
[im 112/150  soft-tissue]
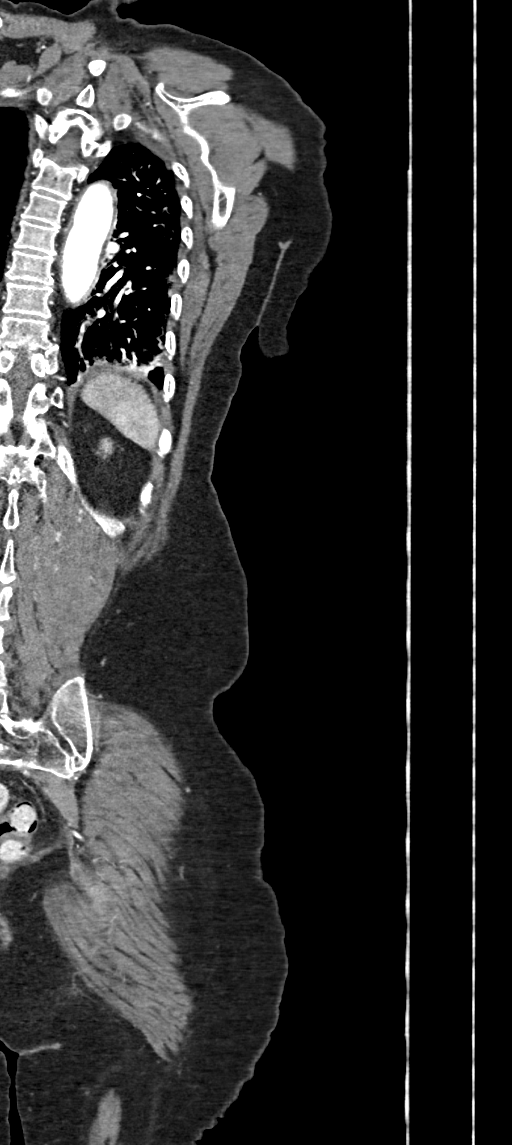

[13 of 46 positions shown; findings below may reference images not displayed]

FINDINGS: Vascular: There is segmental occlusion likely representing
thrombosis of the left subclavian artery beginning just distal to
the origin of the vertebral artery and extending to the axillary
artery. There is reconstitution at the axillary artery, likely from
chest wall collaterals. Flow is then demonstrated in the axillary
artery, brachial artery, and radial artery although diffusely
diminished flow is demonstrated in the ulnar artery. There appears
to be high-grade stenosis or occlusion of the origin of the ulnar
artery.

Soft tissues: No soft tissue mass or hematoma is identified.
Muscular fat planes appear intact.

Visualized chest and abdomen: Normal caliber patent thoracic aorta
without evidence of dissection. Great vessel origins are patent. No
visualized mediastinal mass or lymphadenopathy. Atelectasis in the
left lung base. Left renal cyst. Atherosclerotic calcifications in
the aorta. Narrowing of the origin of the left common iliac artery.

Review of the MIP images confirms the above findings.
IMPRESSION: Segmental occlusion of the left subclavian artery beginning just
distal to the origin of the vertebral artery and extending to the
axillary artery. There is reconstitution at the axillary artery,
likely from chest wall collaterals. There also appears to be focal
high-grade stenosis or occlusion of the origin of the ulnar artery.

## 2019-05-29 NOTE — Telephone Encounter (Signed)
Message routed to PCP CMA  

## 2019-06-24 ENCOUNTER — Other Ambulatory Visit: Payer: Self-pay | Admitting: Adult Health

## 2019-06-29 ENCOUNTER — Other Ambulatory Visit: Payer: Self-pay | Admitting: Adult Health

## 2019-06-29 DIAGNOSIS — F0391 Unspecified dementia with behavioral disturbance: Secondary | ICD-10-CM

## 2019-07-15 ENCOUNTER — Other Ambulatory Visit: Payer: Medicare Other | Admitting: Internal Medicine

## 2019-07-15 ENCOUNTER — Other Ambulatory Visit: Payer: Self-pay | Admitting: Gastroenterology

## 2019-07-15 DIAGNOSIS — F0151 Vascular dementia with behavioral disturbance: Secondary | ICD-10-CM

## 2019-07-15 DIAGNOSIS — Z515 Encounter for palliative care: Secondary | ICD-10-CM

## 2019-07-15 DIAGNOSIS — F01518 Vascular dementia, unspecified severity, with other behavioral disturbance: Secondary | ICD-10-CM

## 2019-07-16 NOTE — Progress Notes (Addendum)
[facility] Consult Note Telephone: [phone]  Fax: [phone]  PATIENT NAME: Stacy Brennan DOB: [birth year] MRN: [id number]  PRIMARY CARE PROVIDER:   Dorothyann Peng, NP  REFERRING PROVIDER:  Dorothyann Peng, NP [address] [place],  [place] [address]  RESPONSIBLE PARTY:   Rosine Beat (daughter)  [phone]      RECOMMENDATIONS and PLAN:  Palliative Care Encounter  Z51.5  1.  Advance Care Planning:  Goals of care are to improve quality of life with return of improved sleep habits and decreased aggressive behaviors.  Pending admission into a memory care unit(currently on a waiting list).  Plan on palliative followup.  2:  Vascular dementia with behaviors and insomnia.  FAST7b  Increased agitation and hallucinations.  10 min naps over past 3 days.  Increase Seroquel to 100 mg tonight at bedtime and continue 50mg  PO q 6hrs prn during daytime hours. Plan on additional discussion with supervising MD and f/u with daughter or 07/16/19 for updates and additional planning as needed. She will need ongoing monitoring and adjustments.  Discussed potential emergency evaluation if worsening of symptoms.   Due to the COVID-19 crisis, this visit occurred telephonically and was initiated and consented by patient and/or family.  I spent 30 minutes providing this consultation,  from 1630 to 1700. More than 50% of the time in this consultation was spent coordinating communication.   HISTORY OF PRESENT ILLNESS: Followup with Stacy Brennan.  Daughter states that patient is still having a great deal of difficulty sleeping and has increased agitation with use of Seroquel.  She is no longer taking Ativan, Resperdone or Trazadone.  She continues to take Phenobarbital as prescribed.  She reports continued hallucinations and frequent falls in the home.  Palliative Care was asked to help address goals of care.  CODE STATUS: Not addressed  PPS: 40%  with falls HOSPICE ELIGIBILITY/DIAGNOSIS: TBD  PAST MEDICAL HISTORY:  Past Medical History:  Diagnosis Date  . Allergic rhinitis, cause unspecified 01/01/2014  . Anxiety state, unspecified 01/01/2014  . Cataract   . Cognitive impairment   . Dementia ([place])   . Depression 01/01/2014  . Glaucoma   . Hypertension   . Other and unspecified hyperlipidemia 01/01/2014  . Renal insufficiency 01/01/2014  . Stroke [facility]) 2002   CVA x 2  . Unspecified asthma(493.90) 01/01/2014  . Vision decreased     SOCIAL HX: Lives with daughter and son-in-law   PERTINENT MEDICATIONS:  Outpatient Encounter Medications as of 07/15/2019  Medication Sig  . alendronate (FOSAMAX) 70 MG tablet Take 1 tablet (70 mg total) by mouth once a week. Take with a full glass of water on an empty stomach. (Patient taking differently: Take 70 mg by mouth every Monday. Take with a full glass of water on an empty stomach.)  . ALPRAZolam (XANAX) 0.5 MG tablet TAKE 1 TABLET BY MOUTH 3 TIMES DAILY AS NEEDED FOR ANXIETY. (Patient taking differently: Patient not taking for now, d/t taking Ativan)  . aspirin EC 81 MG tablet Take 81 mg by mouth daily.  Marland Kitchen atorvastatin (LIPITOR) 20 MG tablet TAKE 1 TABLET BY MOUTH EVERY DAY  . brimonidine (ALPHAGAN) 0.2 % ophthalmic solution Place 1 drop into both eyes 2 (two) times daily.   . chlorhexidine (PERIDEX) 0.12 % solution Use as directed 15 mLs in the mouth or throat daily as needed. Gum Diease  . Chlorphen-Pseudoephed-APAP (TYLENOL ALLERGY SINUS PO) Take 1 tablet by mouth as needed. Holding for now  setting of agitation.  . dorzolamide-timolol (COSOPT) 22.3-6.8 MG/ML ophthalmic solution Place 1 drop into both eyes 2 (two) times daily.  Marland Kitchen esomeprazole (NEXIUM) 40 MG capsule Take 1 capsule (40 mg total) by mouth 2 (two) times a day.  . fluticasone (FLONASE) 50 MCG/ACT nasal spray Place 1 spray into both nostrils daily as needed for allergies or rhinitis. Not taking conistently  . labetalol (NORMODYNE) 200  MG tablet Take 1 tablet (200 mg total) by mouth 2 (two) times daily.  Marland Kitchen LORazepam (ATIVAN) 1 MG tablet TAKE 1 TABLET BY MOUTH AT BEDTIME  . PHENobarbital (LUMINAL) 64.8 MG tablet Take 64.8 mg by mouth 2 (two) times daily. (update 05/01/2019) continue 1 tab in morning, increase eve dose to 2 tabs.  . Psyllium (METAMUCIL FIBER PO) Take by mouth daily. 1 Teaspoon daily  . RHOPRESSA 0.02 % SOLN Place 1 drop into both eyes daily.   . risperiDONE (RISPERDAL) 1 MG tablet TAKE 1 TABLET BY MOUTH TWICE A DAY  . sennosides-docusate sodium (SENOKOT-S) 8.6-50 MG tablet Take 1 tablet by mouth as needed.   Marland Kitchen spironolactone (ALDACTONE) 25 MG tablet TAKE 1 TABLET (25 MG TOTAL) BY MOUTH EVERY MORNING.  . Travoprost, BAK Free, (TRAVATAN) 0.004 % SOLN ophthalmic solution Place 1 drop into both eyes at bedtime.  . traZODone (DESYREL) 100 MG tablet TAKE 2 TABLETS BY MOUTH AT BEDTIME  . verapamil (CALAN-SR) 240 MG CR tablet Take 1 tablet (240 mg total) by mouth at bedtime.   No facility-administered encounter medications on file as of 07/15/2019.  [place]  PHYSICAL EXAM:   General:  Unable to perform examination due to telephone visit.   Gonzella Lex, NP-C

## 2019-07-17 ENCOUNTER — Other Ambulatory Visit: Payer: Self-pay

## 2019-07-18 ENCOUNTER — Other Ambulatory Visit: Payer: Self-pay | Admitting: Adult Health

## 2019-07-18 ENCOUNTER — Other Ambulatory Visit: Payer: Medicare Other | Admitting: Internal Medicine

## 2019-07-19 ENCOUNTER — Other Ambulatory Visit: Payer: Self-pay | Admitting: Adult Health

## 2019-07-19 DIAGNOSIS — R410 Disorientation, unspecified: Secondary | ICD-10-CM

## 2019-07-21 ENCOUNTER — Other Ambulatory Visit: Payer: Self-pay

## 2019-07-21 ENCOUNTER — Other Ambulatory Visit: Payer: Medicare Other | Admitting: Internal Medicine

## 2019-07-21 ENCOUNTER — Other Ambulatory Visit (INDEPENDENT_AMBULATORY_CARE_PROVIDER_SITE_OTHER): Payer: Medicare Other

## 2019-07-21 DIAGNOSIS — R410 Disorientation, unspecified: Secondary | ICD-10-CM

## 2019-07-21 LAB — POCT URINALYSIS DIPSTICK
Bilirubin, UA: NEGATIVE
Blood, UA: NEGATIVE
Glucose, UA: NEGATIVE
Ketones, UA: NEGATIVE
Leukocytes, UA: NEGATIVE
Nitrite, UA: NEGATIVE
Protein, UA: NEGATIVE
Spec Grav, UA: 1.01 (ref 1.010–1.025)
Urobilinogen, UA: 0.2 E.U./dL
pH, UA: 7 (ref 5.0–8.0)

## 2019-07-21 LAB — HM MAMMOGRAPHY

## 2019-07-22 ENCOUNTER — Other Ambulatory Visit: Payer: Medicare Other | Admitting: Internal Medicine

## 2019-07-23 ENCOUNTER — Other Ambulatory Visit: Payer: Self-pay

## 2019-08-03 ENCOUNTER — Other Ambulatory Visit: Payer: Self-pay | Admitting: Adult Health

## 2019-08-05 ENCOUNTER — Other Ambulatory Visit: Payer: Self-pay

## 2019-08-05 ENCOUNTER — Telehealth: Payer: Self-pay

## 2019-08-05 NOTE — Telephone Encounter (Signed)
Copied from [place] [id number]. Topic: General - Other >> Aug 05, 2019 10:51 AM Yvette Rack wrote: Reason for CRM: Pt returned call for screening. Attempted to transfer pt to the office but there was no answer. Pt requests call back. Cb# [phone]

## 2019-08-06 ENCOUNTER — Telehealth: Payer: Self-pay | Admitting: *Deleted

## 2019-08-06 ENCOUNTER — Encounter: Payer: Medicare Other | Admitting: Adult Health

## 2019-08-06 NOTE — Telephone Encounter (Signed)
Spoke to pt daughter this a.m. and she stated that pt is not feeling well and wanted to cancel appt. She stated that her mom has dementia and she doesn't know what days are " her good days". Appt. Has been cancel and there will not be a fee. Spoke to PCP to see when pt could be rescheduled for CPE and he stated that pt is followed by palliative care so pt will not need a CPE. Pt daughter notified of update.

## 2019-08-06 NOTE — Telephone Encounter (Signed)
Copied from [place] [id number]. Topic: General - Other >> Aug 06, 2019 11:02 AM Rainey Pines A wrote: Patients daughter would like to know if appt can be virtual today due to the patient not feeling well. She is also requesting that cpe lab order be placed so that patient  cant come and get labs doen on a day where she is feeling better. Please advise

## 2019-08-27 ENCOUNTER — Other Ambulatory Visit: Payer: Medicare Other | Admitting: Internal Medicine

## 2019-08-27 ENCOUNTER — Other Ambulatory Visit: Payer: Self-pay

## 2019-08-27 DIAGNOSIS — Z515 Encounter for palliative care: Secondary | ICD-10-CM | POA: Diagnosis not present

## 2019-08-27 DIAGNOSIS — R413 Other amnesia: Secondary | ICD-10-CM | POA: Diagnosis not present

## 2019-08-27 NOTE — Progress Notes (Signed)
[facility] Consult Note Telephone: [phone]  Fax: [phone]  PATIENT NAME: Stacy Brennan DOB: Sep 06, 1945 MRN: [id number]  PRIMARY CARE PROVIDER:   Dorothyann Peng, NP  REFERRING PROVIDER:  Dorothyann Peng, NP [address] [place],  [place] [address]  RESPONSIBLE PARTY:   Rosine Beat (daughter)  [phone]      RECOMMENDATIONS and PLAN:  Palliative Care Encounter  Z51.5  1.  Advance Care Planning:  Goals of care remain for  Improvement of quality of life with return of improved sleep habits and decreased aggressive behaviors.  Pending admission into a memory care unit(currently on a waiting list).   Palliative care followup on 09/09/19.  2:  Vascular dementia with behaviors and insomnia.  FAST7b  Continues to have visual hallucinations.    Maintain Seroquel at 200 mg at bedtime and continue 50mg  PO qAM and 50mg  q mid day.   Discussed potential emergency evaluation if worsening of symptoms. Current trajectory does not support a good outcome.  Pt may reach appropriateness for comfort care if she continues to decline.   3.  Dehydration:  Continue to offer oral hydration.  Consider IV hydration if necessary after evaluation of chemistries.  Pt. Drank 1 bottle of water(16 oz) and 1 bottle of Ensure(8 oz) during home visit.  4.  Constipation:  Related to poor hydration and nutritional intake. Encouraged to improve above, continue Metamucil.  Stool softener daily  I spent 60 minutes providing this consultation,  from 1000 to 1100. More than 50% of the time in this consultation was spent coordinating communication with patient and daughter.   HISTORY OF PRESENT ILLNESS: Followup with Stacy Mae Dimitri.  Daughter states that patient is still having a great deal of difficulty sleeping along with increased weakness, little to no nutritional or hydration intake over last 2-3 weeks.  She notes weight loss but has not been able to weigh  patient.  Palliative Care was asked to help address goals of care.  CODE STATUS: FULL CODE  PPS: 40% weak. Requires assistance with ambulation.  Frequent falls  HOSPICE ELIGIBILITY/DIAGNOSIS: TBD  PAST MEDICAL HISTORY:  Past Medical History:  Diagnosis Date  . Allergic rhinitis, cause unspecified 01/01/2014  . Anxiety state, unspecified 01/01/2014  . Cataract   . Cognitive impairment   . Dementia ([place])   . Depression 01/01/2014  . Glaucoma   . Hypertension   . Other and unspecified hyperlipidemia 01/01/2014  . Renal insufficiency 01/01/2014  . Stroke [facility]) 2002   CVA x 2  . Unspecified asthma(493.90) 01/01/2014  . Vision decreased     SOCIAL HX: Lives with daughter and son-in-law   PERTINENT MEDICATIONS:  Outpatient Encounter Medications as of 08/27/2019  Medication Sig  . alendronate (FOSAMAX) 70 MG tablet Take 1 tablet (70 mg total) by mouth once a week. Take with a full glass of water on an empty stomach. (Patient taking differently: Take 70 mg by mouth every Monday. Take with a full glass of water on an empty stomach.)  . ALPRAZolam (XANAX) 0.5 MG tablet TAKE 1 TABLET BY MOUTH 3 TIMES DAILY AS NEEDED FOR ANXIETY. (Patient taking differently: Patient not taking for now, d/t taking Ativan)  . aspirin EC 81 MG tablet Take 81 mg by mouth daily.  Marland Kitchen atorvastatin (LIPITOR) 20 MG tablet TAKE 1 TABLET BY MOUTH EVERY DAY  . brimonidine (ALPHAGAN) 0.2 % ophthalmic solution Place 1 drop into both eyes 2 (two) times daily.   . chlorhexidine (  PERIDEX) 0.12 % solution Use as directed 15 mLs in the mouth or throat daily as needed. Gum Diease  . Chlorphen-Pseudoephed-APAP (TYLENOL ALLERGY SINUS PO) Take 1 tablet by mouth as needed. Holding for now setting of agitation.  . dorzolamide-timolol (COSOPT) 22.3-6.8 MG/ML ophthalmic solution Place 1 drop into both eyes 2 (two) times daily.  Marland Kitchen esomeprazole (NEXIUM) 40 MG capsule Take 1 capsule (40 mg total) by mouth 2 (two) times a day.  . fluticasone  (FLONASE) 50 MCG/ACT nasal spray Place 1 spray into both nostrils daily as needed for allergies or rhinitis. Not taking conistently  . labetalol (NORMODYNE) 200 MG tablet Take 1 tablet (200 mg total) by mouth 2 (two) times daily.  Marland Kitchen PHENobarbital (LUMINAL) 64.8 MG tablet Take 64.8 mg by mouth 2 (two) times daily. (update 05/01/2019) continue 1 tab in morning, increase eve dose to 2 tabs.  . Psyllium (METAMUCIL FIBER PO) Take by mouth daily. 1 Teaspoon daily  . QUEtiapine (SEROQUEL) 50 MG tablet Take 50 mg by mouth at bedtime.  . RHOPRESSA 0.02 % SOLN Place 1 drop into both eyes daily.   . sennosides-docusate sodium (SENOKOT-S) 8.6-50 MG tablet Take 1 tablet by mouth as needed.   Marland Kitchen spironolactone (ALDACTONE) 25 MG tablet TAKE 1 TABLET (25 MG TOTAL) BY MOUTH EVERY MORNING.  . Travoprost, BAK Free, (TRAVATAN) 0.004 % SOLN ophthalmic solution Place 1 drop into both eyes at bedtime.  . verapamil (CALAN-SR) 240 MG CR tablet Take 1 tablet (240 mg total) by mouth at bedtime.   No facility-administered encounter medications on file as of 08/27/2019.  Serouquel  PHYSICAL EXAM:   General: NAD, frail appearing, thin EENT:  Very poor vision.  She uses tactile methods to identify objects Cardiovascular: regular rate and rhythm Pulmonary: clear throughout Abdomen: soft, nontender, + bowel sounds GU: no suprapubic tenderness Extremities: loss of muscle tone, no edema, no joint deformities Skin: exposed skin is intact Neurological: Alert and pleasantly confused. Oriented to person and place. Weakness   Gonzella Lex, NP-C

## 2019-08-28 ENCOUNTER — Encounter: Payer: Self-pay | Admitting: Adult Health

## 2019-08-29 ENCOUNTER — Encounter: Payer: Self-pay | Admitting: Adult Health

## 2019-08-29 ENCOUNTER — Other Ambulatory Visit: Payer: Self-pay

## 2019-08-29 ENCOUNTER — Telehealth (INDEPENDENT_AMBULATORY_CARE_PROVIDER_SITE_OTHER): Payer: Medicare Other | Admitting: Adult Health

## 2019-08-29 DIAGNOSIS — F0391 Unspecified dementia with behavioral disturbance: Secondary | ICD-10-CM | POA: Diagnosis not present

## 2019-08-29 DIAGNOSIS — R627 Adult failure to thrive: Secondary | ICD-10-CM

## 2019-08-29 NOTE — Progress Notes (Signed)
Virtual Visit via Video Note  I connected with Stacy Brennan on 08/29/19 at  1:30 PM EST by a video enabled telemedicine application and verified that I am speaking with the correct person using two identifiers.  Location patient: home Location provider:work or home office Persons participating in the virtual visit: patient, provider, daughter Casimer Lanius)   I discussed the limitations of evaluation and management by telemedicine and the availability of in person appointments. The patient expressed understanding and agreed to proceed.   HPI: 74 year old female who has a history of dementia.  She is under care of palliative care as well.  Was prescribed Seroquel 200 mg at bedtime and 50 mg every morning and 50 mg q. midday for visual hallucinations and insomnia.  Family reports that with the Seroquel the patient is sleeping a little bit longer for the most part gets 3 to 4 hours asleep at night and will take small Naps throughout the day.  She is suffering from sundowners and family reports "once the sun goes down it is like Mr. Jack Quarto comes out".  Daughter also reports that the patient is having difficulty with nutritional or hydration intake over the last few weeks.  She has not really had anything to eat besides an Ensure shake over the last 5 to 6 days and is having very little fluid intake.  She will drink half a glass of juice here and there.   ROS: See pertinent positives and negatives per HPI.  Past Medical History:  Diagnosis Date  . Allergic rhinitis, cause unspecified 01/01/2014  . Anxiety state, unspecified 01/01/2014  . Cataract   . Cognitive impairment   . Dementia ([place])   . Depression 01/01/2014  . Glaucoma   . Hypertension   . Other and unspecified hyperlipidemia 01/01/2014  . Renal insufficiency 01/01/2014  . Stroke [facility]) 2002   CVA x 2  . Unspecified asthma(493.90) 01/01/2014  . Vision decreased     Past Surgical History:  Procedure Laterality Date  . BIOPSY  11/20/2018   Procedure: BIOPSY;  Surgeon: Lavena Bullion, DO;  Location: [facility];  Service: Gastroenterology;;  . DILATION AND CURETTAGE OF UTERUS    . ESOPHAGOGASTRODUODENOSCOPY (EGD) WITH PROPOFOL N/A 11/20/2018   Procedure: ESOPHAGOGASTRODUODENOSCOPY (EGD) WITH PROPOFOL;  Surgeon: Lavena Bullion, DO;  Location: [facility];  Service: Gastroenterology;  Laterality: N/A;  . THROMBECTOMY BRACHIAL ARTERY Left 08/30/2018   Procedure: LEFT SUBCLAVIAN ARTERY THROMBECTOMY;  Surgeon: Waynetta Sandy, MD;  Location: [facility];  Service: Vascular;  Laterality: Left;  . [place]      Family History  Problem Relation Age of Onset  . Cancer Other        prostate cancer  . Hypertension Other   . Diabetes Other   . Thyroid disease Other   . Hyperlipidemia Other   . Hypertension Other   . Glaucoma Other   . Parkinsonism Neg Hx   . Dementia Neg Hx   . Colon cancer Neg Hx   . Esophageal cancer Neg Hx        Current Outpatient Medications:  .  alendronate (FOSAMAX) 70 MG tablet, Take 1 tablet (70 mg total) by mouth once a week. Take with a full glass of water on an empty stomach. (Patient taking differently: Take 70 mg by mouth every Monday. Take with a full glass of water on an empty stomach.), Disp: 4 tablet, Rfl: 11 .  ALPRAZolam (XANAX) 0.5 MG tablet, TAKE 1 TABLET BY MOUTH 3 TIMES DAILY AS  NEEDED FOR ANXIETY. (Patient taking differently: Patient not taking for now, d/t taking Ativan), Disp: 90 tablet, Rfl: 1 .  aspirin EC 81 MG tablet, Take 81 mg by mouth daily., Disp: , Rfl:  .  atorvastatin (LIPITOR) 20 MG tablet, TAKE 1 TABLET BY MOUTH EVERY DAY, Disp: 90 tablet, Rfl: 0 .  brimonidine (ALPHAGAN) 0.2 % ophthalmic solution, Place 1 drop into both eyes 2 (two) times daily. , Disp: , Rfl:  .  chlorhexidine (PERIDEX) 0.12 % solution, Use as directed 15 mLs in the mouth or throat daily as needed. Gum Diease, Disp: , Rfl:  .  Chlorphen-Pseudoephed-APAP (TYLENOL ALLERGY SINUS PO), Take 1  tablet by mouth as needed. Holding for now setting of agitation., Disp: , Rfl:  .  dorzolamide-timolol (COSOPT) 22.3-6.8 MG/ML ophthalmic solution, Place 1 drop into both eyes 2 (two) times daily., Disp: , Rfl:  .  esomeprazole (NEXIUM) 40 MG capsule, Take 1 capsule (40 mg total) by mouth 2 (two) times a day., Disp: 112 capsule, Rfl: 0 .  fluticasone (FLONASE) 50 MCG/ACT nasal spray, Place 1 spray into both nostrils daily as needed for allergies or rhinitis. Not taking conistently, Disp: , Rfl:  .  labetalol (NORMODYNE) 200 MG tablet, Take 1 tablet (200 mg total) by mouth 2 (two) times daily., Disp: 180 tablet, Rfl: 1 .  PHENobarbital (LUMINAL) 64.8 MG tablet, Take 64.8 mg by mouth 2 (two) times daily. (update 05/01/2019) continue 1 tab in morning, increase eve dose to 2 tabs., Disp: , Rfl:  .  Psyllium (METAMUCIL FIBER PO), Take by mouth daily. 1 Teaspoon daily, Disp: , Rfl:  .  QUEtiapine (SEROQUEL) 50 MG tablet, Take 50 mg by mouth at bedtime., Disp: , Rfl:  .  RHOPRESSA 0.02 % SOLN, Place 1 drop into both eyes daily. , Disp: , Rfl:  .  sennosides-docusate sodium (SENOKOT-S) 8.6-50 MG tablet, Take 1 tablet by mouth as needed. , Disp: , Rfl:  .  spironolactone (ALDACTONE) 25 MG tablet, TAKE 1 TABLET (25 MG TOTAL) BY MOUTH EVERY MORNING., Disp: 90 tablet, Rfl: 1 .  Travoprost, BAK Free, (TRAVATAN) 0.004 % SOLN ophthalmic solution, Place 1 drop into both eyes at bedtime., Disp: , Rfl:  .  verapamil (CALAN-SR) 240 MG CR tablet, Take 1 tablet (240 mg total) by mouth at bedtime., Disp: 90 tablet, Rfl: 1  EXAM:  VITALS per patient if applicable:  GENERAL: alert, oriented, appears well and in no acute distress  HEENT: atraumatic, conjunttiva clear, no obvious abnormalities on inspection of external nose and ears  NECK: normal movements of the head and neck  LUNGS: on inspection no signs of respiratory distress, breathing rate appears normal, no obvious gross SOB, gasping or wheezing  CV: no  obvious cyanosis  MS: moves all visible extremities without noticeable abnormality  PSYCH/NEURO: pleasant and cooperative, no obvious depression or anxiety, speech and thought processing grossly intact  ASSESSMENT AND PLAN:  Discussed the following assessment and plan:  1. Dementia with behavioral disturbance, unspecified dementia type ([place]) -Continue with current plan by palliative care.  2. Failure to thrive in adult -Courage trying to use ice cream or Pedialyte popsicles for hydration.  We will follow-up with her family early next week to see how she is doing    I discussed the assessment and treatment plan with the patient. The patient was provided an opportunity to ask questions and all were answered. The patient agreed with the plan and demonstrated an understanding of the instructions.   The  patient was advised to call back or seek an in-person evaluation if the symptoms worsen or if the condition fails to improve as anticipated.   Dorothyann Peng, NP

## 2019-09-04 ENCOUNTER — Telehealth: Payer: Self-pay | Admitting: Adult Health

## 2019-09-04 ENCOUNTER — Other Ambulatory Visit: Payer: Self-pay

## 2019-09-04 ENCOUNTER — Inpatient Hospital Stay (HOSPITAL_COMMUNITY)
Admission: EM | Admit: 2019-09-04 | Discharge: 2019-09-14 | DRG: 683 | Disposition: A | Discharge status: Hospice | Payer: Medicare Other | Attending: Family Medicine | Admitting: Family Medicine

## 2019-09-04 ENCOUNTER — Observation Stay (HOSPITAL_COMMUNITY): Payer: Medicare Other

## 2019-09-04 ENCOUNTER — Emergency Department (HOSPITAL_COMMUNITY): Payer: Medicare Other

## 2019-09-04 ENCOUNTER — Telehealth: Payer: Self-pay | Admitting: Internal Medicine

## 2019-09-04 DIAGNOSIS — Z87891 Personal history of nicotine dependence: Secondary | ICD-10-CM

## 2019-09-04 DIAGNOSIS — N1832 Chronic kidney disease, stage 3b: Secondary | ICD-10-CM | POA: Diagnosis present

## 2019-09-04 DIAGNOSIS — Z79899 Other long term (current) drug therapy: Secondary | ICD-10-CM

## 2019-09-04 DIAGNOSIS — R531 Weakness: Secondary | ICD-10-CM

## 2019-09-04 DIAGNOSIS — R627 Adult failure to thrive: Secondary | ICD-10-CM | POA: Diagnosis not present

## 2019-09-04 DIAGNOSIS — R413 Other amnesia: Secondary | ICD-10-CM

## 2019-09-04 DIAGNOSIS — H409 Unspecified glaucoma: Secondary | ICD-10-CM | POA: Diagnosis present

## 2019-09-04 DIAGNOSIS — Z515 Encounter for palliative care: Secondary | ICD-10-CM | POA: Diagnosis present

## 2019-09-04 DIAGNOSIS — G4700 Insomnia, unspecified: Secondary | ICD-10-CM | POA: Diagnosis present

## 2019-09-04 DIAGNOSIS — N390 Urinary tract infection, site not specified: Secondary | ICD-10-CM | POA: Diagnosis not present

## 2019-09-04 DIAGNOSIS — J45909 Unspecified asthma, uncomplicated: Secondary | ICD-10-CM | POA: Diagnosis present

## 2019-09-04 DIAGNOSIS — F32A Depression, unspecified: Secondary | ICD-10-CM | POA: Diagnosis present

## 2019-09-04 DIAGNOSIS — R441 Visual hallucinations: Secondary | ICD-10-CM | POA: Diagnosis present

## 2019-09-04 DIAGNOSIS — Z03818 Encounter for observation for suspected exposure to other biological agents ruled out: Secondary | ICD-10-CM | POA: Diagnosis not present

## 2019-09-04 DIAGNOSIS — I10 Essential (primary) hypertension: Secondary | ICD-10-CM

## 2019-09-04 DIAGNOSIS — Z66 Do not resuscitate: Secondary | ICD-10-CM | POA: Diagnosis not present

## 2019-09-04 DIAGNOSIS — Z20822 Contact with and (suspected) exposure to covid-19: Secondary | ICD-10-CM | POA: Diagnosis not present

## 2019-09-04 DIAGNOSIS — F329 Major depressive disorder, single episode, unspecified: Secondary | ICD-10-CM | POA: Diagnosis present

## 2019-09-04 DIAGNOSIS — I088 Other rheumatic multiple valve diseases: Secondary | ICD-10-CM | POA: Diagnosis present

## 2019-09-04 DIAGNOSIS — H269 Unspecified cataract: Secondary | ICD-10-CM | POA: Diagnosis present

## 2019-09-04 DIAGNOSIS — K5900 Constipation, unspecified: Secondary | ICD-10-CM | POA: Diagnosis not present

## 2019-09-04 DIAGNOSIS — E860 Dehydration: Secondary | ICD-10-CM | POA: Diagnosis not present

## 2019-09-04 DIAGNOSIS — D696 Thrombocytopenia, unspecified: Secondary | ICD-10-CM | POA: Diagnosis present

## 2019-09-04 DIAGNOSIS — N289 Disorder of kidney and ureter, unspecified: Secondary | ICD-10-CM | POA: Diagnosis not present

## 2019-09-04 DIAGNOSIS — I639 Cerebral infarction, unspecified: Secondary | ICD-10-CM | POA: Diagnosis present

## 2019-09-04 DIAGNOSIS — B952 Enterococcus as the cause of diseases classified elsewhere: Secondary | ICD-10-CM | POA: Diagnosis present

## 2019-09-04 DIAGNOSIS — E782 Mixed hyperlipidemia: Secondary | ICD-10-CM | POA: Diagnosis not present

## 2019-09-04 DIAGNOSIS — Z888 Allergy status to other drugs, medicaments and biological substances status: Secondary | ICD-10-CM

## 2019-09-04 DIAGNOSIS — E872 Acidosis: Secondary | ICD-10-CM | POA: Diagnosis not present

## 2019-09-04 DIAGNOSIS — N179 Acute kidney failure, unspecified: Secondary | ICD-10-CM | POA: Diagnosis not present

## 2019-09-04 DIAGNOSIS — H548 Legal blindness, as defined in USA: Secondary | ICD-10-CM | POA: Diagnosis present

## 2019-09-04 DIAGNOSIS — Z8673 Personal history of transient ischemic attack (TIA), and cerebral infarction without residual deficits: Secondary | ICD-10-CM

## 2019-09-04 DIAGNOSIS — Z7982 Long term (current) use of aspirin: Secondary | ICD-10-CM

## 2019-09-04 DIAGNOSIS — E785 Hyperlipidemia, unspecified: Secondary | ICD-10-CM | POA: Diagnosis present

## 2019-09-04 DIAGNOSIS — F0391 Unspecified dementia with behavioral disturbance: Secondary | ICD-10-CM | POA: Diagnosis present

## 2019-09-04 DIAGNOSIS — E46 Unspecified protein-calorie malnutrition: Secondary | ICD-10-CM

## 2019-09-04 DIAGNOSIS — I951 Orthostatic hypotension: Secondary | ICD-10-CM | POA: Diagnosis present

## 2019-09-04 DIAGNOSIS — Z8249 Family history of ischemic heart disease and other diseases of the circulatory system: Secondary | ICD-10-CM

## 2019-09-04 DIAGNOSIS — F419 Anxiety disorder, unspecified: Secondary | ICD-10-CM | POA: Diagnosis present

## 2019-09-04 DIAGNOSIS — F411 Generalized anxiety disorder: Secondary | ICD-10-CM

## 2019-09-04 DIAGNOSIS — E876 Hypokalemia: Secondary | ICD-10-CM | POA: Diagnosis present

## 2019-09-04 DIAGNOSIS — I129 Hypertensive chronic kidney disease with stage 1 through stage 4 chronic kidney disease, or unspecified chronic kidney disease: Secondary | ICD-10-CM | POA: Diagnosis present

## 2019-09-04 DIAGNOSIS — R109 Unspecified abdominal pain: Secondary | ICD-10-CM | POA: Diagnosis not present

## 2019-09-04 DIAGNOSIS — Z881 Allergy status to other antibiotic agents status: Secondary | ICD-10-CM

## 2019-09-04 LAB — COMPREHENSIVE METABOLIC PANEL
ALT: 27 U/L (ref 0–44)
AST: 22 U/L (ref 15–41)
Albumin: 4.7 g/dL (ref 3.5–5.0)
Alkaline Phosphatase: 116 U/L (ref 38–126)
Anion gap: 13 (ref 5–15)
BUN: 78 mg/dL — ABNORMAL HIGH (ref 8–23)
CO2: 17 mmol/L — ABNORMAL LOW (ref 22–32)
Calcium: 10.3 mg/dL (ref 8.9–10.3)
Chloride: 115 mmol/L — ABNORMAL HIGH (ref 98–111)
Creatinine, Ser: 3.84 mg/dL — ABNORMAL HIGH (ref 0.44–1.00)
GFR calc Af Amer: 13 mL/min — ABNORMAL LOW (ref >60)
GFR calc non Af Amer: 11 mL/min — ABNORMAL LOW (ref >60)
Glucose, Bld: 116 mg/dL — ABNORMAL HIGH (ref 70–99)
Potassium: 4.8 mmol/L (ref 3.5–5.1)
Sodium: 145 mmol/L (ref 135–145)
Total Bilirubin: 1 mg/dL (ref 0.3–1.2)
Total Protein: 9.1 g/dL — ABNORMAL HIGH (ref 6.5–8.1)

## 2019-09-04 LAB — LIPASE, BLOOD: Lipase: 59 U/L — ABNORMAL HIGH (ref 11–51)

## 2019-09-04 LAB — CBC WITH DIFFERENTIAL/PLATELET
Abs Immature Granulocytes: 0.02 10*3/uL (ref 0.00–0.07)
Basophils Absolute: 0 10*3/uL (ref 0.0–0.1)
Basophils Relative: 1 %
Eosinophils Absolute: 0.1 10*3/uL (ref 0.0–0.5)
Eosinophils Relative: 1 %
HCT: 39.6 % (ref 36.0–46.0)
Hemoglobin: 12.6 g/dL (ref 12.0–15.0)
Immature Granulocytes: 0 %
Lymphocytes Relative: 15 %
Lymphs Abs: 1.1 10*3/uL (ref 0.7–4.0)
MCH: 29.4 pg (ref 26.0–34.0)
MCHC: 31.8 g/dL (ref 30.0–36.0)
MCV: 92.5 fL (ref 80.0–100.0)
Monocytes Absolute: 0.7 10*3/uL (ref 0.1–1.0)
Monocytes Relative: 9 %
Neutro Abs: 5.4 10*3/uL (ref 1.7–7.7)
Neutrophils Relative %: 74 %
Platelets: 241 10*3/uL (ref 150–400)
RBC: 4.28 MIL/uL (ref 3.87–5.11)
RDW: 13.8 % (ref 11.5–15.5)
WBC: 7.3 10*3/uL (ref 4.0–10.5)
nRBC: 0 % (ref 0.0–0.2)

## 2019-09-04 LAB — SARS CORONAVIRUS 2 (TAT 6-24 HRS): SARS Coronavirus 2: NEGATIVE

## 2019-09-04 MED ORDER — NETARSUDIL DIMESYLATE 0.02 % OP SOLN
1.0000 [drp] | Freq: Every day | OPHTHALMIC | Status: DC
  Administered 2019-09-06 – 2019-09-11 (×5): 1 [drp] via OPHTHALMIC

## 2019-09-04 MED ORDER — POLYETHYLENE GLYCOL 3350 17 G PO PACK
17.0000 g | PACK | Freq: Every day | ORAL | Status: DC
  Administered 2019-09-06 – 2019-09-09 (×2): 17 g via ORAL
  Filled 2019-09-04 (×4): qty 1

## 2019-09-04 MED ORDER — QUETIAPINE FUMARATE 50 MG PO TABS: 50.0000 mg | ORAL_TABLET | Freq: Three times a day (TID) | ORAL | Status: DC

## 2019-09-04 MED ORDER — DORZOLAMIDE HCL-TIMOLOL MAL 2-0.5 % OP SOLN
1.0000 [drp] | Freq: Two times a day (BID) | OPHTHALMIC | Status: DC
  Administered 2019-09-05 – 2019-09-13 (×15): 1 [drp] via OPHTHALMIC
  Filled 2019-09-04: qty 10

## 2019-09-04 MED ORDER — CHLORHEXIDINE GLUCONATE 0.12 % MT SOLN: 15.0000 mL | Freq: Every day | OROMUCOSAL | Status: DC | PRN

## 2019-09-04 MED ORDER — ONDANSETRON HCL 4 MG PO TABS: 4.0000 mg | ORAL_TABLET | Freq: Four times a day (QID) | ORAL | Status: DC | PRN

## 2019-09-04 MED ORDER — HYDRALAZINE HCL 20 MG/ML IJ SOLN
10.0000 mg | Freq: Three times a day (TID) | INTRAMUSCULAR | Status: DC | PRN
  Administered 2019-09-04 – 2019-09-12 (×2): 10 mg via INTRAVENOUS
  Filled 2019-09-04 (×2): qty 1

## 2019-09-04 MED ORDER — SODIUM CHLORIDE 0.9 % IV BOLUS
1000.0000 mL | Freq: Once | INTRAVENOUS | Status: AC
  Administered 2019-09-04: 17:00:00 1000 mL via INTRAVENOUS

## 2019-09-04 MED ORDER — LATANOPROST 0.005 % OP SOLN
1.0000 [drp] | Freq: Every day | OPHTHALMIC | Status: DC
  Administered 2019-09-05: 1 [drp] via OPHTHALMIC
  Filled 2019-09-04: qty 2.5

## 2019-09-04 MED ORDER — SENNOSIDES-DOCUSATE SODIUM 8.6-50 MG PO TABS
1.0000 | ORAL_TABLET | Freq: Two times a day (BID) | ORAL | Status: DC
  Administered 2019-09-04 – 2019-09-13 (×8): 1 via ORAL
  Filled 2019-09-04 (×14): qty 1

## 2019-09-04 MED ORDER — SODIUM CHLORIDE 0.9 % IV SOLN: INTRAVENOUS | Status: DC

## 2019-09-04 MED ORDER — ASPIRIN EC 81 MG PO TBEC
81.0000 mg | DELAYED_RELEASE_TABLET | Freq: Every day | ORAL | Status: DC
  Administered 2019-09-06 – 2019-09-12 (×4): 81 mg via ORAL
  Filled 2019-09-04 (×6): qty 1

## 2019-09-04 MED ORDER — ATORVASTATIN CALCIUM 20 MG PO TABS
20.0000 mg | ORAL_TABLET | Freq: Every day | ORAL | Status: DC
  Administered 2019-09-06 – 2019-09-12 (×4): 20 mg via ORAL
  Filled 2019-09-04 (×6): qty 1

## 2019-09-04 MED ORDER — QUETIAPINE FUMARATE 200 MG PO TABS
300.0000 mg | ORAL_TABLET | Freq: Every day | ORAL | Status: DC
  Administered 2019-09-04 – 2019-09-13 (×9): 300 mg via ORAL
  Filled 2019-09-04 (×11): qty 1

## 2019-09-04 MED ORDER — ONDANSETRON HCL 4 MG/2ML IJ SOLN
4.0000 mg | Freq: Four times a day (QID) | INTRAMUSCULAR | Status: DC | PRN
  Administered 2019-09-09 – 2019-09-11 (×2): 4 mg via INTRAVENOUS
  Filled 2019-09-04 (×3): qty 2

## 2019-09-04 MED ORDER — QUETIAPINE FUMARATE 50 MG PO TABS
50.0000 mg | ORAL_TABLET | Freq: Two times a day (BID) | ORAL | Status: DC
  Administered 2019-09-05 – 2019-09-12 (×9): 50 mg via ORAL
  Filled 2019-09-04 (×14): qty 1

## 2019-09-04 MED ORDER — HYDROCODONE-ACETAMINOPHEN 5-325 MG PO TABS: 1.0000 | ORAL_TABLET | ORAL | Status: DC | PRN

## 2019-09-04 MED ORDER — LORAZEPAM 2 MG/ML IJ SOLN
0.5000 mg | Freq: Three times a day (TID) | INTRAMUSCULAR | Status: DC | PRN
  Administered 2019-09-04 – 2019-09-10 (×7): 0.5 mg via INTRAVENOUS
  Filled 2019-09-04 (×7): qty 1

## 2019-09-04 MED ORDER — ACETAMINOPHEN 325 MG PO TABS: 650.0000 mg | ORAL_TABLET | Freq: Four times a day (QID) | ORAL | Status: DC | PRN

## 2019-09-04 MED ORDER — SODIUM CHLORIDE 0.9 % IV BOLUS
1000.0000 mL | Freq: Once | INTRAVENOUS | Status: AC
  Administered 2019-09-04: 15:00:00 1000 mL via INTRAVENOUS

## 2019-09-04 MED ORDER — ACETAMINOPHEN 650 MG RE SUPP: 650.0000 mg | Freq: Four times a day (QID) | RECTAL | Status: DC | PRN

## 2019-09-04 MED ORDER — LABETALOL HCL 5 MG/ML IV SOLN
10.0000 mg | INTRAVENOUS | Status: DC | PRN
  Filled 2019-09-04: qty 4

## 2019-09-04 MED ORDER — FLEET ENEMA 7-19 GM/118ML RE ENEM
1.0000 | ENEMA | Freq: Once | RECTAL | Status: AC
  Administered 2019-09-04: 1 via RECTAL
  Filled 2019-09-04: qty 1

## 2019-09-04 MED ORDER — HEPARIN SODIUM (PORCINE) 5000 UNIT/ML IJ SOLN
5000.0000 [IU] | Freq: Three times a day (TID) | INTRAMUSCULAR | Status: DC
  Administered 2019-09-05 – 2019-09-14 (×23): 5000 [IU] via SUBCUTANEOUS
  Filled 2019-09-04 (×23): qty 1

## 2019-09-04 MED ORDER — BRIMONIDINE TARTRATE 0.2 % OP SOLN
1.0000 [drp] | Freq: Two times a day (BID) | OPHTHALMIC | Status: DC
  Administered 2019-09-05 – 2019-09-13 (×15): 1 [drp] via OPHTHALMIC
  Filled 2019-09-04: qty 5

## 2019-09-04 NOTE — ED Notes (Signed)
Admitting MD at bedside.

## 2019-09-04 NOTE — Progress Notes (Signed)
Report received from ED 

## 2019-09-04 NOTE — Telephone Encounter (Signed)
fyi

## 2019-09-04 NOTE — Telephone Encounter (Signed)
Return phone call to daughter Mrs. Loanne Drilling who reports that patient has not eaten and only had a few sips of water in the past week in correspondence to minimal nutritional intake and hydration over the past two to three weeks. She has not taken any medication in past 3 days.  She did drink one Ensure about 1 week ago. Patient is less ambulatory, she's not had a bowel movement in at least one week and appears more more lethargic. Long discussion with daughter related to options of potential treatment for weakness and no nutritional intake versus transition to hospice care for end of life treatment. Daughter states that she would prefer to have immediate family members visit with patient and for them to encourage her to eat and if this does not work, she will have EMS transport patient to the hospital. After hospitalization, if possible, and if no improvement she would then make possible transition to hospice care. Encouraged daughter to not delay hospital visit if that is her desire as patient can continue to decline without Improvement. She will inform me of her decisions in the next one to two days. Support given. Patient remains a full code for daughter's request as of today. Palliative care will follow up with patient as needed.  Informed PCP, Dorothyann Peng, NP of patient status and discussion with daughter.   Gonzella Lex, NP-C

## 2019-09-04 NOTE — ED Provider Notes (Signed)
[facility] DEPT Provider Note   CSN: [id number] Arrival date & time: 09/04/19  1259     History Chief Complaint  Patient presents with  . Failure To Thrive    Stacy Brennan is a 74 y.o. female.  74 yo F with a chief complaints of failure to thrive.  Family states that she has not been eating and drinking for about 2 weeks.  She is under the care of palliative care as well.  Decided today to bring her to the hospital for evaluation.  Patient states that she has been having trouble having bowel movements.  Has been straining without producing.  Has had some mild stomach discomfort with this.  No nausea or vomiting.  No fevers.  The history is provided by the patient and a relative.  Illness Severity:  Moderate Onset quality:  Gradual Duration:  2 weeks Timing:  Constant Progression:  Worsening Chronicity:  New Associated symptoms: abdominal pain   Associated symptoms: no chest pain, no congestion, no fever, no headaches, no myalgias, no nausea, no rhinorrhea, no shortness of breath, no vomiting and no wheezing        Past Medical History:  Diagnosis Date  . Allergic rhinitis, cause unspecified 01/01/2014  . Anxiety state, unspecified 01/01/2014  . Cataract   . Cognitive impairment   . Dementia ([place])   . Depression 01/01/2014  . Glaucoma   . Hypertension   . Other and unspecified hyperlipidemia 01/01/2014  . Renal insufficiency 01/01/2014  . Stroke [facility]) 2002   CVA x 2  . Unspecified asthma(493.90) 01/01/2014  . Vision decreased     Patient Active Problem List   Diagnosis Date Noted  . Chronic anticoagulation   . Duodenal ulcer   . Gastric ulcer without hemorrhage or perforation   . Esophageal stricture   . GI bleed 11/19/2018  . Melena 11/19/2018  . Syncope, non cardiac 11/19/2018  . AKI (acute kidney injury) ([place]) 11/19/2018  . CKD (chronic kidney disease) stage 3, GFR 30-59 ml/min 11/19/2018  . Acute blood loss anemia   . 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 Osteoporosis 01/21/2015  . Encounter to establish care 12/21/2014  . Sinusitis, chronic 12/21/2014  . Fatigue 09/16/2014  . Cough 09/16/2014  . Benzodiazepine dependence ([place]) 05/02/2014  . Cataract 03/22/2014  . Cognitive impairment 03/22/2014  . Snoring 03/22/2014  . Excessive daytime sleepiness 03/22/2014  . Insomnia 03/22/2014  . Anxiety state 01/01/2014  . Hyperlipidemia 01/01/2014  . Renal insufficiency 01/01/2014  . Preventative health care 01/01/2014  . Allergic rhinitis, cause unspecified 01/01/2014  . Asthma 01/01/2014  . Depression 01/01/2014  . Stroke ([place])   . Hypertension   . Glaucoma     Past Surgical History:  Procedure Laterality Date  . BIOPSY  11/20/2018   Procedure: BIOPSY;  Surgeon: Lavena Bullion, DO;  Location: [facility];  Service: Gastroenterology;;  . DILATION AND CURETTAGE OF UTERUS    . ESOPHAGOGASTRODUODENOSCOPY (EGD) WITH PROPOFOL N/A 11/20/2018   Procedure: ESOPHAGOGASTRODUODENOSCOPY (EGD) WITH PROPOFOL;  Surgeon: Lavena Bullion, DO;  Location: [facility];  Service: Gastroenterology;  Laterality: N/A;  . THROMBECTOMY BRACHIAL ARTERY Left 08/30/2018   Procedure: LEFT SUBCLAVIAN ARTERY THROMBECTOMY;  Surgeon: Waynetta Sandy, MD;  Location: [place];  Service: Vascular;  Laterality: Left;  . TUBALIZATION       OB History   No obstetric history on file.     Family History  Problem Relation Age of Onset  . Cancer  Other        prostate cancer  . Hypertension Other   . Diabetes Other   . Thyroid disease Other   . Hyperlipidemia Other   . Hypertension Other   . Glaucoma Other   . Parkinsonism Neg Hx   . Dementia Neg Hx   . Colon cancer Neg Hx   . Esophageal cancer Neg Hx     Social History   Tobacco Use  . Smoking status: Former Smoker    Quit date: 03/20/2002    Years since quitting: 17.4  . Smokeless tobacco: Former Systems developer    Types: Snuff  Substance Use Topics  . Alcohol  use: No  . Drug use: No    Home Medications Prior to Admission medications   Medication Sig Start Date End Date Taking? Authorizing Provider  alendronate (FOSAMAX) 70 MG tablet Take 1 tablet (70 mg total) by mouth once a week. Take with a full glass of water on an empty stomach. Patient taking differently: Take 70 mg by mouth every Monday. Take with a full glass of water on an empty stomach. 01/21/15   Nafziger, Tommi Rumps, NP  ALPRAZolam (XANAX) 0.5 MG tablet TAKE 1 TABLET BY MOUTH 3 TIMES DAILY AS NEEDED FOR ANXIETY. Patient taking differently: Patient not taking for now, d/t taking Ativan 03/18/19   Dorothyann Peng, NP  aspirin EC 81 MG tablet Take 81 mg by mouth daily.    [provider]  atorvastatin (LIPITOR) 20 MG tablet TAKE 1 TABLET BY MOUTH EVERY DAY 08/05/19   Nafziger, Tommi Rumps, NP  brimonidine (ALPHAGAN) 0.2 % ophthalmic solution Place 1 drop into both eyes 2 (two) times daily.  04/17/18   [provider]  chlorhexidine (PERIDEX) 0.12 % solution Use as directed 15 mLs in the mouth or throat daily as needed. Gum Diease    [provider]  Chlorphen-Pseudoephed-APAP (TYLENOL ALLERGY SINUS PO) Take 1 tablet by mouth as needed. Holding for now setting of agitation.    [provider]  dorzolamide-timolol (COSOPT) 22.3-6.8 MG/ML ophthalmic solution Place 1 drop into both eyes 2 (two) times daily.    [provider]  esomeprazole (NEXIUM) 40 MG capsule Take 1 capsule (40 mg total) by mouth 2 (two) times a day. 02/10/19 04/28/20  Cirigliano, Vito V, DO  fluticasone (FLONASE) 50 MCG/ACT nasal spray Place 1 spray into both nostrils daily as needed for allergies or rhinitis. Not taking conistently    [provider]  labetalol (NORMODYNE) 200 MG tablet Take 1 tablet (200 mg total) by mouth 2 (two) times daily. 01/15/19   Nafziger, Tommi Rumps, NP  PHENobarbital (LUMINAL) 64.8 MG tablet Take 64.8 mg by mouth 2 (two) times daily. (update 05/01/2019) continue 1 tab in  morning, increase eve dose to 2 tabs.    [provider]  Psyllium (METAMUCIL FIBER PO) Take by mouth daily. 1 Teaspoon daily    [provider]  QUEtiapine (SEROQUEL) 50 MG tablet Take 50 mg by mouth at bedtime.    [provider]  RHOPRESSA 0.02 % SOLN Place 1 drop into both eyes daily.  05/15/18   [provider]  sennosides-docusate sodium (SENOKOT-S) 8.6-50 MG tablet Take 1 tablet by mouth as needed.     [provider]  spironolactone (ALDACTONE) 25 MG tablet TAKE 1 TABLET (25 MG TOTAL) BY MOUTH EVERY MORNING. 01/15/19   Nafziger, Tommi Rumps, NP  Travoprost, BAK Free, (TRAVATAN) 0.004 % SOLN ophthalmic solution Place 1 drop into both eyes at bedtime.  [provider]  verapamil (CALAN-SR) 240 MG CR tablet Take 1 tablet (240 mg total) by mouth at bedtime. 01/15/19   Nafziger, Tommi Rumps, NP    Allergies    Warfarin, Doxycycline, and Aricept [donepezil hcl]  Review of Systems   Review of Systems  Constitutional: Negative for chills and fever.  HENT: Negative for congestion and rhinorrhea.   Eyes: Negative for redness and visual disturbance.  Respiratory: Negative for shortness of breath and wheezing.   Cardiovascular: Negative for chest pain and palpitations.  Gastrointestinal: Positive for abdominal pain and constipation. Negative for nausea and vomiting.  Genitourinary: Negative for dysuria and urgency.  Musculoskeletal: Negative for arthralgias and myalgias.  Skin: Negative for pallor and wound.  Neurological: Negative for dizziness and headaches.    Physical Exam Updated Vital Signs SpO2 99%   Physical Exam Vitals and nursing note reviewed.  Constitutional:      General: She is not in acute distress.    Appearance: She is well-developed. She is not diaphoretic.  HENT:     Head: Normocephalic and atraumatic.  Eyes:     Pupils: Pupils are equal, round, and reactive to light.  Cardiovascular:     Rate and Rhythm: Normal rate and  regular rhythm.     Heart sounds: No murmur. No friction rub. No gallop.   Pulmonary:     Effort: Pulmonary effort is normal.     Breath sounds: No wheezing or rales.  Abdominal:     General: There is no distension.     Palpations: Abdomen is soft.     Tenderness: There is no abdominal tenderness.  Genitourinary:    Comments: Clay like stool in the vault just to the reach of my fingertip. Musculoskeletal:        General: No tenderness.     Cervical back: Normal range of motion and neck supple.  Skin:    General: Skin is warm and dry.  Neurological:     Mental Status: She is alert and oriented to person, place, and time.  Psychiatric:        Behavior: Behavior normal.     ED Results / Procedures / Treatments   Labs (all labs ordered are listed, but only abnormal results are displayed) Labs Reviewed  CBC WITH DIFFERENTIAL/PLATELET  COMPREHENSIVE METABOLIC PANEL  LIPASE, BLOOD    EKG None  Radiology No results found.  Procedures Procedures (including critical care time)  Medications Ordered in ED Medications  sodium chloride 0.9 % bolus 1,000 mL (has no administration in time range)  sodium phosphate (FLEET) 7-19 GM/118ML enema 1 enema (has no administration in time range)    ED Course  I have reviewed the triage vital signs and the nursing notes.  Pertinent labs & imaging results that were available during my care of the patient were reviewed by me and considered in my medical decision making (see chart for details).    MDM Rules/Calculators/A&P                      74 yo F with a chief complaints of decreased oral intake.  Going on for about 2 weeks.  Likely with impaction though out of my reach.  We will do an enema here.  With prolonged course will obtain a CT scan.  Lab work.  Reassess.  Acute kidney injury on lab work.  Discussed case with Dr. Darl Householder, please see his note for further details care in the ED.  The patients results  and plan were reviewed and  discussed.   Any x-rays performed were independently reviewed by myself.   Differential diagnosis were considered with the presenting HPI.  Medications  aspirin EC tablet 81 mg (has no administration in time range)  atorvastatin (LIPITOR) tablet 20 mg (has no administration in time range)  brimonidine (ALPHAGAN) 0.2 % ophthalmic solution 1 drop (1 drop Both Eyes Not Given 09/04/19 2156)  chlorhexidine (PERIDEX) 0.12 % solution 15 mL (has no administration in time range)  dorzolamide-timolol (COSOPT) 22.3-6.8 MG/ML ophthalmic solution 1 drop (1 drop Both Eyes Not Given 09/04/19 2156)  Netarsudil Dimesylate 0.02 % SOLN 1 drop (has no administration in time range)  latanoprost (XALATAN) 0.005 % ophthalmic solution 1 drop (1 drop Both Eyes Not Given 09/04/19 2157)  LORazepam (ATIVAN) injection 0.5 mg (0.5 mg Intravenous Given 09/04/19 2015)  hydrALAZINE (APRESOLINE) injection 10 mg (10 mg Intravenous Given 09/04/19 2156)  labetalol (NORMODYNE) injection 10 mg (has no administration in time range)  heparin injection 5,000 Units (5,000 Units Subcutaneous Given 09/05/19 0546)  acetaminophen (TYLENOL) tablet 650 mg (has no administration in time range)    Or  acetaminophen (TYLENOL) suppository 650 mg (has no administration in time range)  HYDROcodone-acetaminophen (NORCO/VICODIN) 5-325 MG per tablet 1-2 tablet (has no administration in time range)  ondansetron (ZOFRAN) tablet 4 mg (has no administration in time range)    Or  ondansetron (ZOFRAN) injection 4 mg (has no administration in time range)  senna-docusate (Senokot-S) tablet 1 tablet (1 tablet Oral Given 09/04/19 2155)  polyethylene glycol (MIRALAX / GLYCOLAX) packet 17 g (has no administration in time range)  0.9 %  sodium chloride infusion ( Intravenous Rate/Dose Verify 09/05/19 0600)  QUEtiapine (SEROQUEL) tablet 50 mg (has no administration in time range)    And  QUEtiapine (SEROQUEL) tablet 300 mg (300 mg Oral Given 09/04/19 2155)  sodium chloride  0.9 % bolus 1,000 mL (1,000 mLs Intravenous New Bag/Given 09/04/19 1442)  sodium phosphate (FLEET) 7-19 GM/118ML enema 1 enema (1 enema Rectal Given 09/04/19 1453)  sodium chloride 0.9 % bolus 1,000 mL (1,000 mLs Intravenous New Bag/Given 09/04/19 1654)    Vitals:   09/04/19 1715 09/04/19 1840 09/04/19 2146 09/05/19 0558  BP:  (!) 191/72 (!) 182/75 116/66  Pulse: 81 82  (!) 108  Resp: 20 18  18   Temp:  99.2 F (37.3 C) 99.1 F (37.3 C) 98.5 F (36.9 C)  TempSrc:  Oral Axillary Axillary  SpO2: 99% 100%  100%    Final diagnoses:  Dehydration  AKI (acute kidney injury) [facility])     Final Clinical Impression(s) / ED Diagnoses Final diagnoses:  None    Rx / DC Orders ED Discharge Orders    None       Deno Etienne, DO 09/05/19 [id number]

## 2019-09-04 NOTE — ED Notes (Signed)
Transport has still not arrived, [place] NT to bring pt upstairs.

## 2019-09-04 NOTE — Telephone Encounter (Signed)
Stacy Brennan is returning [name]'s call from yesterday. She wants to let him know that [name] will be picked up at 11:15 am today by ambulance. It has been 13 days since she has last eaten and barely has had anything to drink as well.   If she can be called back at [phone]

## 2019-09-04 NOTE — ED Triage Notes (Signed)
Pt brought in by EMS from home with complaints of FTT. The family stated pt has not been wanting to eat for 2 weeks. Pt has a hx of dementia and is AO x2. Pt ambulates with assistance. EMS reports that family stated pt has a blood clot in the left arm. Per EMS pt is blind.

## 2019-09-04 NOTE — H&P (Addendum)
History and Physical  Stacy Brennan [id number] DOB: 1946-06-04 DOA: 09/04/2019   Patient coming from: Home  Chief Complaint: Poor oral intake/failure to thrive in an adult  HPI: Stacy Mae Breeding is a 74 y.o. female with medical history significant for dementia, hypertension, CKD, CVA, depression, glaucoma, cataracts, was brought to the ED by her daughter complaining of poor oral intake for the past 2-3 weeks, only able to drink Ensure and small amounts of water/juice intermittently.  Not taking any medication for the past 3 days.  Patient also has not had any bowel movements in the past couple of days.  Patient has significant history of dementia with visual hallucinations and insomnia.  Patient is under the care of palliative as an outpatient.  EDP had an extensive discussion with daughter, patient is currently DNR and daughters specifically stated no SNF admission, wants patient back home once hydrated and stabilized.  ED Course: Vital signs stable except for uncontrolled BP, labs showed creatinine of 3.84 above baseline around 1.2, BUN 116, hemoglobin 12.6 which is higher than her baseline around 10.  COVID-19 test pending, UA/UC/chest x-ray currently are pending.  CT abdomen/pelvis done was unremarkable.  EDP disimpacted patient as well as suppositories with good results.  Patient admitted for IV hydration and stabilization.   Review of Systems: Review of systems are otherwise negative   Past Medical History:  Diagnosis Date  . Allergic rhinitis, cause unspecified 01/01/2014  . Anxiety state, unspecified 01/01/2014  . Cataract   . Cognitive impairment   . Dementia ([place])   . Depression 01/01/2014  . Glaucoma   . Hypertension   . Other and unspecified hyperlipidemia 01/01/2014  . Renal insufficiency 01/01/2014  . Stroke [facility]) 2002   CVA x 2  . Unspecified asthma(493.90) 01/01/2014  . Vision decreased    Past Surgical History:  Procedure Laterality Date  . BIOPSY  11/20/2018   Procedure: BIOPSY;  Surgeon: Lavena Bullion, DO;  Location: [facility];  Service: Gastroenterology;;  . DILATION AND CURETTAGE OF UTERUS    . ESOPHAGOGASTRODUODENOSCOPY (EGD) WITH PROPOFOL N/A 11/20/2018   Procedure: ESOPHAGOGASTRODUODENOSCOPY (EGD) WITH PROPOFOL;  Surgeon: Lavena Bullion, DO;  Location: [facility];  Service: Gastroenterology;  Laterality: N/A;  . THROMBECTOMY BRACHIAL ARTERY Left 08/30/2018   Procedure: LEFT SUBCLAVIAN ARTERY THROMBECTOMY;  Surgeon: Waynetta Sandy, MD;  Location: [place];  Service: Vascular;  Laterality: Left;  . TUBALIZATION      Social History:  reports that she quit smoking about 17 years ago. She has quit using smokeless tobacco.  Her smokeless tobacco use included snuff. She reports that she does not drink alcohol or use drugs.   Allergies  Allergen Reactions  . Warfarin Palpitations  . Doxycycline Rash  . Aricept [Donepezil Hcl] Other (See Comments)    Insomnia and not eating     Family History  Problem Relation Age of Onset  . Cancer Other        prostate cancer  . Hypertension Other   . Diabetes Other   . Thyroid disease Other   . Hyperlipidemia Other   . Hypertension Other   . Glaucoma Other   . Parkinsonism Neg Hx   . Dementia Neg Hx   . Colon cancer Neg Hx   . Esophageal cancer Neg Hx       Prior to Admission medications   Medication Sig Start Date End Date Taking? Authorizing Provider  aspirin EC 81 MG tablet Take 81 mg by mouth daily.  Yes [provider]  atorvastatin (LIPITOR) 20 MG tablet TAKE 1 TABLET BY MOUTH EVERY DAY 08/05/19  Yes Nafziger, Tommi Rumps, NP  brimonidine (ALPHAGAN) 0.2 % ophthalmic solution Place 1 drop into both eyes 2 (two) times daily.  04/17/18  Yes [provider]  chlorhexidine (PERIDEX) 0.12 % solution Use as directed 15 mLs in the mouth or throat daily as needed (mouth pain). Gum Diease    Yes [provider]  docusate sodium (COLACE) 100 MG capsule Take 100 mg  by mouth daily as needed for mild constipation.   Yes [provider]  dorzolamide-timolol (COSOPT) 22.3-6.8 MG/ML ophthalmic solution Place 1 drop into both eyes 2 (two) times daily.   Yes [provider]  esomeprazole (NEXIUM) 40 MG capsule Take 1 capsule (40 mg total) by mouth 2 (two) times a day. 02/10/19 04/28/20 Yes Cirigliano, Vito V, DO  labetalol (NORMODYNE) 200 MG tablet Take 1 tablet (200 mg total) by mouth 2 (two) times daily. 01/15/19  Yes Nafziger, Tommi Rumps, NP  Psyllium (METAMUCIL FIBER PO) Take 5 mLs by mouth every evening. 1 Teaspoon daily    Yes [provider]  QUEtiapine (SEROQUEL) 50 MG tablet Take 50-300 mg by mouth 3 (three) times daily. Take 1 in the morning, Take 1 in the afternoon and Take 6 tablets at bedtime   Yes [provider]  RHOPRESSA 0.02 % SOLN Place 1 drop into both eyes daily.  05/15/18  Yes [provider]  spironolactone (ALDACTONE) 25 MG tablet TAKE 1 TABLET (25 MG TOTAL) BY MOUTH EVERY MORNING. 01/15/19  Yes Nafziger, Tommi Rumps, NP  Travoprost, BAK Free, (TRAVATAN) 0.004 % SOLN ophthalmic solution Place 1 drop into both eyes at bedtime.   Yes [provider]  verapamil (CALAN-SR) 240 MG CR tablet Take 1 tablet (240 mg total) by mouth at bedtime. 01/15/19  Yes Nafziger, Tommi Rumps, NP  alendronate (FOSAMAX) 70 MG tablet Take 1 tablet (70 mg total) by mouth once a week. Take with a full glass of water on an empty stomach. Patient taking differently: Take 70 mg by mouth every Monday. Take with a full glass of water on an empty stomach. 01/21/15   Nafziger, Tommi Rumps, NP  ALPRAZolam (XANAX) 0.5 MG tablet TAKE 1 TABLET BY MOUTH 3 TIMES DAILY AS NEEDED FOR ANXIETY. Patient not taking: Reported on 09/04/2019 03/18/19   Dorothyann Peng, NP    Physical Exam: BP (!) 164/91   Pulse 79   Resp 20   SpO2 100%   General: NAD, legally blind Eyes: Normal ENT: Normal Neck: Supple Cardiovascular: S1, S2 present Respiratory: CTAB Abdomen:  Soft, nontender, nondistended, bowel sounds present Skin: Normal Musculoskeletal: No pedal edema bilaterally Psychiatric: Unable to assess Neurologic: No obvious focal neurologic deficits          Labs on Admission:  Basic Metabolic Panel: Recent Labs  Lab 09/04/19 1440  NA 145  K 4.8  CL 115*  CO2 17*  GLUCOSE 116*  BUN 78*  CREATININE 3.84*  CALCIUM 10.3   Liver Function Tests: Recent Labs  Lab 09/04/19 1440  AST 22  ALT 27  ALKPHOS 116  BILITOT 1.0  PROT 9.1*  ALBUMIN 4.7   Recent Labs  Lab 09/04/19 1440  LIPASE 59*   No results for input(s): AMMONIA in the last 168 hours. CBC: Recent Labs  Lab 09/04/19 1440  WBC 7.3  NEUTROABS 5.4  HGB 12.6  HCT 39.6  MCV 92.5  PLT 241   Cardiac Enzymes: No results for  input(s): CKTOTAL, CKMB, CKMBINDEX, TROPONINI in the last 168 hours.  BNP (last 3 results) No results for input(s): BNP in the last 8760 hours.  ProBNP (last 3 results) No results for input(s): PROBNP in the last 8760 hours.  CBG: No results for input(s): GLUCAP in the last 168 hours.  Radiological Exams on Admission: CT ABDOMEN PELVIS WO CONTRAST  Result Date: 09/04/2019 CLINICAL DATA:  Abdominal pain EXAM: CT ABDOMEN AND PELVIS WITHOUT CONTRAST TECHNIQUE: Multidetector CT imaging of the abdomen and pelvis was performed following the standard protocol without IV contrast. COMPARISON:  None. FINDINGS: Lower chest: The visualized heart size within normal limits. No pericardial fluid/thickening. No hiatal hernia. The visualized portions of the lungs are clear. Hepatobiliary: There is a 2.6 cm low-density lesion anterior left liver lobe. No evidence of calcified gallstones or biliary ductal dilatation. Pancreas:  Unremarkable.  No surrounding inflammatory changes. Spleen: Normal in size. Although limited due to the lack of intravenous contrast, normal in appearance. Adrenals/Urinary Tract: Both adrenal glands appear normal. Right-sided renal atrophy is  seen. There is compensatory slight hypertrophy of the left kidney. There is a low-density lesion seen in the lower pole the left kidney measuring 3.8 cm, likely renal cyst. No renal or collecting system calculi seen. The bladder is unremarkable. Stomach/Bowel: The stomach, small bowel, and colon are normal in appearance. No inflammatory changes or obstructive findings. appendix is normal. Vascular/Lymphatic: There are no enlarged abdominal or pelvic lymph nodes. Scattered aortic atherosclerotic calcifications are seen without aneurysmal dilatation. Reproductive: Partially calcified uterine fibroids are seen throughout. The adnexa are grossly unremarkable. Other: No evidence of abdominal wall mass or hernia. Musculoskeletal: No acute or significant osseous findings. IMPRESSION: No acute intra-abdominal or pelvic pathology to explain the patient's symptoms. Aortic Atherosclerosis (ICD10-I70.0). Electronically Signed   By: Prudencio Pair M.D.   On: 09/04/2019 15:48    EKG: Pending  Assessment/Plan Present on Admission: . AKI (acute kidney injury) ([place]) . Stroke ([place]) . Hypertension . Glaucoma . Anxiety state . Hyperlipidemia . Depression  Principal Problem:   AKI (acute kidney injury) ([place]) Active Problems:   Stroke ([place])   Hypertension   Glaucoma   Anxiety state   Hyperlipidemia   Renal insufficiency   Depression   Poor oral intake/failure to thrive Likely related to ??progressive dementia Poor oral intake for the past 2 to 3 weeks CT abdomen/pelvis unremarkable Rule out any source of infection, UA/UC, chest x-ray pending, COVID-19 pending IV fluids Dietitian consulted  AKI on CKD stage III Likely 2/2 above Has had no urine output since in the ED Creatinine on admission 3.84, baseline around 1.23 IV fluids CT abdomen/pelvis showed no renal abnormalities Bladder scan as needed, in and out cath as needed Daily BMP  Constipation EDP disimpacted patient with good results in the  ED Bowel regimen  Hypertension Uncontrolled, likely 2/2 poor compliance Hydralazine as needed, labetalol as needed Hold home p.o. meds for now  History of CVA Continue home aspirin, Lipitor  History of glaucoma/cataracts/legally blind Continue all eyedrops  Dementia with behavioral disturbance History of visual hallucination, insomnia, anxiety Continue quetiapine, Ativan as needed Pt will require safety sitter      DVT prophylaxis: Heparin [place]  Code Status: Full  Family Communication: Dicussed with daughters at  bedside  Disposition Plan: Likely home (Daughters don't want SNF placement if recommended)  Consults called: None  Admission status: Observation    Alma Friendly MD [facility]   If 7PM-7AM, please contact night-coverage [email or web address]  09/04/2019, 5:27 PM

## 2019-09-04 NOTE — ED Notes (Signed)
Transport called.

## 2019-09-04 NOTE — ED Provider Notes (Signed)
  Physical Exam  BP (!) 184/79   Pulse 74   Resp 20   SpO2 100%   Physical Exam  ED Course/Procedures     Procedures  MDM  Patient care assumed at 3 pm.  Patient has a history of dementia and has not been eating and drinking much for the last 2 weeks. Patient was disimpacted by the previous provider and enema was given with good result.  Signout pending CT abdomen pelvis.  4:54 PM CT unremarkable. Has acute renal failure and elevated BUN likely from prerenal dehydration. Given IVF. Talked to daughters extensively. They want her hydrated and eventually go home and doesn't want nursing home placement. Hospitalist to admit    Drenda Freeze, MD 09/04/19 1655

## 2019-09-05 ENCOUNTER — Encounter (HOSPITAL_COMMUNITY): Payer: Self-pay | Admitting: Internal Medicine

## 2019-09-05 DIAGNOSIS — G4700 Insomnia, unspecified: Secondary | ICD-10-CM | POA: Diagnosis present

## 2019-09-05 DIAGNOSIS — R5381 Other malaise: Secondary | ICD-10-CM | POA: Diagnosis not present

## 2019-09-05 DIAGNOSIS — E860 Dehydration: Secondary | ICD-10-CM | POA: Diagnosis present

## 2019-09-05 DIAGNOSIS — E785 Hyperlipidemia, unspecified: Secondary | ICD-10-CM | POA: Diagnosis present

## 2019-09-05 DIAGNOSIS — E872 Acidosis: Secondary | ICD-10-CM | POA: Diagnosis present

## 2019-09-05 DIAGNOSIS — I951 Orthostatic hypotension: Secondary | ICD-10-CM | POA: Diagnosis present

## 2019-09-05 DIAGNOSIS — K5900 Constipation, unspecified: Secondary | ICD-10-CM | POA: Diagnosis present

## 2019-09-05 DIAGNOSIS — Z741 Need for assistance with personal care: Secondary | ICD-10-CM | POA: Diagnosis not present

## 2019-09-05 DIAGNOSIS — F0391 Unspecified dementia with behavioral disturbance: Secondary | ICD-10-CM | POA: Diagnosis present

## 2019-09-05 DIAGNOSIS — R159 Full incontinence of feces: Secondary | ICD-10-CM | POA: Diagnosis not present

## 2019-09-05 DIAGNOSIS — Z7401 Bed confinement status: Secondary | ICD-10-CM | POA: Diagnosis not present

## 2019-09-05 DIAGNOSIS — M2550 Pain in unspecified joint: Secondary | ICD-10-CM | POA: Diagnosis not present

## 2019-09-05 DIAGNOSIS — Z515 Encounter for palliative care: Secondary | ICD-10-CM | POA: Diagnosis present

## 2019-09-05 DIAGNOSIS — G9340 Encephalopathy, unspecified: Secondary | ICD-10-CM | POA: Diagnosis not present

## 2019-09-05 DIAGNOSIS — R531 Weakness: Secondary | ICD-10-CM | POA: Diagnosis not present

## 2019-09-05 DIAGNOSIS — E876 Hypokalemia: Secondary | ICD-10-CM | POA: Diagnosis present

## 2019-09-05 DIAGNOSIS — Z20822 Contact with and (suspected) exposure to covid-19: Secondary | ICD-10-CM | POA: Diagnosis present

## 2019-09-05 DIAGNOSIS — R627 Adult failure to thrive: Secondary | ICD-10-CM | POA: Diagnosis present

## 2019-09-05 DIAGNOSIS — I10 Essential (primary) hypertension: Secondary | ICD-10-CM | POA: Diagnosis not present

## 2019-09-05 DIAGNOSIS — D696 Thrombocytopenia, unspecified: Secondary | ICD-10-CM | POA: Diagnosis present

## 2019-09-05 DIAGNOSIS — Z66 Do not resuscitate: Secondary | ICD-10-CM | POA: Diagnosis not present

## 2019-09-05 DIAGNOSIS — N1832 Chronic kidney disease, stage 3b: Secondary | ICD-10-CM | POA: Diagnosis present

## 2019-09-05 DIAGNOSIS — B952 Enterococcus as the cause of diseases classified elsewhere: Secondary | ICD-10-CM | POA: Diagnosis present

## 2019-09-05 DIAGNOSIS — Z87891 Personal history of nicotine dependence: Secondary | ICD-10-CM | POA: Diagnosis not present

## 2019-09-05 DIAGNOSIS — N179 Acute kidney failure, unspecified: Secondary | ICD-10-CM | POA: Diagnosis present

## 2019-09-05 DIAGNOSIS — Z6822 Body mass index (BMI) 22.0-22.9, adult: Secondary | ICD-10-CM | POA: Diagnosis not present

## 2019-09-05 DIAGNOSIS — D631 Anemia in chronic kidney disease: Secondary | ICD-10-CM | POA: Diagnosis not present

## 2019-09-05 DIAGNOSIS — I129 Hypertensive chronic kidney disease with stage 1 through stage 4 chronic kidney disease, or unspecified chronic kidney disease: Secondary | ICD-10-CM | POA: Diagnosis present

## 2019-09-05 DIAGNOSIS — R32 Unspecified urinary incontinence: Secondary | ICD-10-CM | POA: Diagnosis not present

## 2019-09-05 DIAGNOSIS — H409 Unspecified glaucoma: Secondary | ICD-10-CM | POA: Diagnosis present

## 2019-09-05 DIAGNOSIS — N289 Disorder of kidney and ureter, unspecified: Secondary | ICD-10-CM | POA: Diagnosis not present

## 2019-09-05 DIAGNOSIS — G309 Alzheimer's disease, unspecified: Secondary | ICD-10-CM | POA: Diagnosis not present

## 2019-09-05 DIAGNOSIS — N390 Urinary tract infection, site not specified: Secondary | ICD-10-CM | POA: Diagnosis present

## 2019-09-05 DIAGNOSIS — E782 Mixed hyperlipidemia: Secondary | ICD-10-CM | POA: Diagnosis not present

## 2019-09-05 DIAGNOSIS — F419 Anxiety disorder, unspecified: Secondary | ICD-10-CM | POA: Diagnosis present

## 2019-09-05 DIAGNOSIS — I69818 Other symptoms and signs involving cognitive functions following other cerebrovascular disease: Secondary | ICD-10-CM | POA: Diagnosis not present

## 2019-09-05 DIAGNOSIS — F329 Major depressive disorder, single episode, unspecified: Secondary | ICD-10-CM | POA: Diagnosis present

## 2019-09-05 DIAGNOSIS — H548 Legal blindness, as defined in USA: Secondary | ICD-10-CM | POA: Diagnosis present

## 2019-09-05 DIAGNOSIS — H269 Unspecified cataract: Secondary | ICD-10-CM | POA: Diagnosis present

## 2019-09-05 DIAGNOSIS — F411 Generalized anxiety disorder: Secondary | ICD-10-CM | POA: Diagnosis present

## 2019-09-05 DIAGNOSIS — F0280 Dementia in other diseases classified elsewhere without behavioral disturbance: Secondary | ICD-10-CM | POA: Diagnosis not present

## 2019-09-05 DIAGNOSIS — Z7189 Other specified counseling: Secondary | ICD-10-CM | POA: Diagnosis not present

## 2019-09-05 LAB — COMPREHENSIVE METABOLIC PANEL
ALT: 22 U/L (ref 0–44)
AST: 29 U/L (ref 15–41)
Albumin: 3.8 g/dL (ref 3.5–5.0)
Alkaline Phosphatase: 94 U/L (ref 38–126)
Anion gap: 13 (ref 5–15)
BUN: 59 mg/dL — ABNORMAL HIGH (ref 8–23)
CO2: 9 mmol/L — ABNORMAL LOW (ref 22–32)
Calcium: 9.4 mg/dL (ref 8.9–10.3)
Chloride: 123 mmol/L — ABNORMAL HIGH (ref 98–111)
Creatinine, Ser: 3.11 mg/dL — ABNORMAL HIGH (ref 0.44–1.00)
GFR calc Af Amer: 16 mL/min — ABNORMAL LOW (ref >60)
GFR calc non Af Amer: 14 mL/min — ABNORMAL LOW (ref >60)
Glucose, Bld: 82 mg/dL (ref 70–99)
Potassium: 4.2 mmol/L (ref 3.5–5.1)
Sodium: 145 mmol/L (ref 135–145)
Total Bilirubin: 0.8 mg/dL (ref 0.3–1.2)
Total Protein: 7.2 g/dL (ref 6.5–8.1)

## 2019-09-05 LAB — URINALYSIS, ROUTINE W REFLEX MICROSCOPIC
Bilirubin Urine: NEGATIVE
Glucose, UA: NEGATIVE mg/dL
Hgb urine dipstick: NEGATIVE
Ketones, ur: 20 mg/dL — AB
Leukocytes,Ua: NEGATIVE
Nitrite: NEGATIVE
Protein, ur: 30 mg/dL — AB
Specific Gravity, Urine: 1.016 (ref 1.005–1.030)
pH: 6 (ref 5.0–8.0)

## 2019-09-05 LAB — CBC
HCT: 34.1 % — ABNORMAL LOW (ref 36.0–46.0)
Hemoglobin: 10.8 g/dL — ABNORMAL LOW (ref 12.0–15.0)
MCH: 30.1 pg (ref 26.0–34.0)
MCHC: 31.7 g/dL (ref 30.0–36.0)
MCV: 95 fL (ref 80.0–100.0)
Platelets: 185 10*3/uL (ref 150–400)
RBC: 3.59 MIL/uL — ABNORMAL LOW (ref 3.87–5.11)
RDW: 14 % (ref 11.5–15.5)
WBC: 9.4 10*3/uL (ref 4.0–10.5)
nRBC: 0 % (ref 0.0–0.2)

## 2019-09-05 MED ORDER — SODIUM BICARBONATE-DEXTROSE 150-5 MEQ/L-% IV SOLN
150.0000 meq | Freq: Once | INTRAVENOUS | Status: AC
  Administered 2019-09-05: 150 meq via INTRAVENOUS
  Filled 2019-09-05: qty 1000

## 2019-09-05 MED ORDER — ENSURE ENLIVE PO LIQD
237.0000 mL | Freq: Two times a day (BID) | ORAL | Status: DC
  Administered 2019-09-06 – 2019-09-09 (×5): 237 mL via ORAL

## 2019-09-05 MED ORDER — ADULT MULTIVITAMIN W/MINERALS CH
1.0000 | ORAL_TABLET | Freq: Every day | ORAL | Status: DC
  Administered 2019-09-06 – 2019-09-10 (×3): 1 via ORAL
  Filled 2019-09-05 (×6): qty 1

## 2019-09-05 NOTE — Progress Notes (Signed)
PROGRESS NOTE  Stacy Brennan RUE:[id number] DOB: Dec 14, 1945 DOA: 09/04/2019 PCP: Dorothyann Peng, NP  HPI/Recap of past 24 hours: Stacy Brennan is a 74 y.o. female with medical history significant for dementia, hypertension, CKD, CVA, depression, glaucoma, cataracts, was brought to the ED by her daughter complaining of poor oral intake for the past 2-3 weeks, only able to drink Ensure and small amounts of water/juice intermittently.  Not taking any medication for the past 3 days. Patient also has not had any bowel movements in the past couple of days.  Patient has significant history of dementia with visual hallucinations and insomnia.  Patient is under the care of palliative as an outpatient.  EDP had an extensive discussion with daughter, specifically stated no SNF admission, wants patient back home once hydrated and stabilized. In the ED, VSS except for uncontrolled BP, labs showed creatinine of 3.84 above baseline around 1.2, BUN 116, hemoglobin 12.6 which is higher than her baseline around 10.  COVID-19 test pending, UA/UC/chest x-ray currently are pending.  CT abdomen/pelvis done was unremarkable.  EDP disimpacted patient as well as suppositories with good results.  Patient admitted for IV hydration and stabilization.    Today, met patient in bed resting, easily arousable.  Denied any new complaints, although history of dementia.  Assessment/Plan: Principal Problem:   AKI (acute kidney injury) ([place]) Active Problems:   Stroke ([place])   Hypertension   Glaucoma   Anxiety state   Hyperlipidemia   Renal insufficiency   Depression   Poor oral intake/failure to thrive Likely related to ??progressive dementia Poor oral intake for the past 2 to 3 weeks CT abdomen/pelvis unremarkable Rule out any source of infection, UA negative, UC pending  Chest x-ray unremarkable, COVID-19 negative IV fluids Dietitian consulted  AKI on CKD stage III with metabolic acidosis Likely 2/2  above Creatinine on admission 3.84, baseline around 1.23 IV fluids with bicarb CT abdomen/pelvis showed no renal abnormalities Bladder scan as needed, in and out cath as needed Daily BMP  Acute urinary retention Foley drained about 600 mils of urine Strict I's and O's Plan for voiding trial  Constipation EDP disimpacted patient with good results in the ED Bowel regimen  Hypertension Hydralazine as needed, labetalol as needed Hold home p.o. meds for now  History of CVA Continue home aspirin, Lipitor  History of glaucoma/cataracts/legally blind Continue multiple eyedrops  Dementia with behavioral disturbance History of visual hallucination, insomnia, anxiety Continue quetiapine, Ativan as needed Pt will require safety sitter  GOC Discussed extensively with daughter is on 09/04/2019, wants patient to be full code Currently being followed by outpatient palliative care       Malnutrition Type:  Nutrition Problem: Inadequate oral intake Etiology: lethargy/confusion   Malnutrition Characteristics:  Signs/Symptoms: per patient/family report   Nutrition Interventions:  Interventions: Ensure Enlive (each supplement provides 350kcal and 20 grams of protein), MVI    Estimated body mass index is 23.71 kg/m as calculated from the following:   Height as of 04/04/19: '5\' 7"'$  (1.702 m).   Weight as of 04/04/19: 68.7 kg.     Code Status: Full  Family Communication: Discussed with daughter is on 09/04/2019  Disposition Plan: Daughters insists on discharging patient to home, refusing SNF if recommended   Consultants:  None  Procedures:  None  Antimicrobials:  None  DVT prophylaxis: [facility] [place]   Objective: Vitals:   09/04/19 1840 09/04/19 2146 09/05/19 0558 09/05/19 1401  BP: (!) 191/72 (!) 182/75 116/66 124/79  Pulse: 82  (!)  108 (!) 124  Resp: '18  18 16  '$ Temp: 99.2 F (37.3 C) 99.1 F (37.3 C) 98.5 F (36.9 C) 98.1 F (36.7 C)  TempSrc: Oral  Axillary Axillary Oral  SpO2: 100%  100% 98%    Intake/Output Summary (Last 24 hours) at 09/05/2019 1645 Last data filed at 09/05/2019 1600 Gross per 24 hour  Intake 1012.73 ml  Output 1000 ml  Net 12.73 ml   There were no vitals filed for this visit.  Exam:  General: NAD, lethargic  Cardiovascular: S1, S2 present  Respiratory: CTAB  Abdomen: Soft, nontender, nondistended, bowel sounds present  Musculoskeletal: No bilateral pedal edema noted  Skin: Normal  Psychiatry:  Unable to assess   Data Reviewed: CBC: Recent Labs  Lab 09/04/19 1440 09/05/19 0433  WBC 7.3 9.4  NEUTROABS 5.4  --   HGB 12.6 10.8*  HCT 39.6 34.1*  MCV 92.5 95.0  PLT 241 706   Basic Metabolic Panel: Recent Labs  Lab 09/04/19 1440 09/05/19 0433  NA 145 145  K 4.8 4.2  CL 115* 123*  CO2 17* 9*  GLUCOSE 116* 82  BUN 78* 59*  CREATININE 3.84* 3.11*  CALCIUM 10.3 9.4   GFR: CrCl cannot be calculated (Unknown ideal weight.). Liver Function Tests: Recent Labs  Lab 09/04/19 1440 09/05/19 0433  AST 22 29  ALT 27 22  ALKPHOS 116 94  BILITOT 1.0 0.8  PROT 9.1* 7.2  ALBUMIN 4.7 3.8   Recent Labs  Lab 09/04/19 1440  LIPASE 59*   No results for input(s): AMMONIA in the last 168 hours. Coagulation Profile: No results for input(s): INR, PROTIME in the last 168 hours. Cardiac Enzymes: No results for input(s): CKTOTAL, CKMB, CKMBINDEX, TROPONINI in the last 168 hours. BNP (last 3 results) No results for input(s): PROBNP in the last 8760 hours. HbA1C: No results for input(s): HGBA1C in the last 72 hours. CBG: No results for input(s): GLUCAP in the last 168 hours. Lipid Profile: No results for input(s): CHOL, HDL, LDLCALC, TRIG, CHOLHDL, LDLDIRECT in the last 72 hours. Thyroid Function Tests: No results for input(s): TSH, T4TOTAL, FREET4, T3FREE, THYROIDAB in the last 72 hours. Anemia Panel: No results for input(s): VITAMINB12, FOLATE, FERRITIN, TIBC, IRON, RETICCTPCT in the last  72 hours. Urine analysis:    Component Value Date/Time   COLORURINE YELLOW 09/05/2019 1002   APPEARANCEUR CLEAR 09/05/2019 1002   LABSPEC 1.016 09/05/2019 1002   PHURINE 6.0 09/05/2019 1002   GLUCOSEU NEGATIVE 09/05/2019 1002   HGBUR NEGATIVE 09/05/2019 1002   [place] 09/05/2019 1002   BILIRUBINUR Negative 07/21/2019 1554   KETONESUR 20 (A) 09/05/2019 1002   PROTEINUR 30 (A) 09/05/2019 1002   UROBILINOGEN 0.2 07/21/2019 1554   NITRITE NEGATIVE 09/05/2019 1002   LEUKOCYTESUR NEGATIVE 09/05/2019 1002   Sepsis Labs: '@LABRCNTIP'$ (procalcitonin:4,lacticidven:4)  ) Recent Results (from the past 240 hour(s))  SARS CORONAVIRUS 2 (TAT 6-24 HRS) Nasopharyngeal Nasopharyngeal Swab     Status: None   Collection Time: 09/04/19  4:54 PM   Specimen: Nasopharyngeal Swab  Result Value Ref Range Status   SARS Coronavirus 2 NEGATIVE NEGATIVE Final    Comment: (NOTE) SARS-CoV-2 target nucleic acids are NOT DETECTED. The SARS-CoV-2 RNA is generally detectable in upper and lower respiratory specimens during the acute phase of infection. Negative results do not preclude SARS-CoV-2 infection, do not rule out co-infections with other pathogens, and should not be used as the sole basis for treatment or other patient management decisions. Negative results must be combined  with clinical observations, patient history, and epidemiological information. The expected result is Negative. Fact Sheet for Patients: SugarRoll.be Fact Sheet for Healthcare Providers: https://www.woods-mathews.com/ This test is not yet approved or cleared by the [place] FDA and  has been authorized for detection and/or diagnosis of SARS-CoV-2 by FDA under an Emergency Use Authorization (EUA). This EUA will remain  in effect (meaning this test can be used) for the duration of the COVID-19 declaration under Section 56 4(b)(1) of the Act, 21 U.S.C. section 360bbb-3(b)(1),  unless the authorization is terminated or revoked sooner. Performed at [facility], [place] [address], [place], [place] [address]       Studies: DG CHEST PORT 1 VIEW  Result Date: 09/04/2019 CLINICAL DATA:  74 year old female with failure to thrive. EXAM: PORTABLE CHEST 1 VIEW COMPARISON:  None. FINDINGS: The lungs are clear. There is no pleural effusion or pneumothorax. The cardiac silhouette is within normal limits. Atherosclerotic calcification of the aorta. No acute osseous pathology. Osteopenia. IMPRESSION: No active disease. Electronically Signed   By: Anner Crete M.D.   On: 09/04/2019 18:22    Scheduled Meds: . aspirin EC  81 mg Oral Daily  . atorvastatin  20 mg Oral Daily  . brimonidine  1 drop Both Eyes BID  . dorzolamide-timolol  1 drop Both Eyes BID  . feeding supplement (ENSURE ENLIVE)  237 mL Oral BID BM  . heparin  5,000 Units Subcutaneous Q8H  . latanoprost  1 drop Both Eyes QHS  . multivitamin with minerals  1 tablet Oral Daily  . Netarsudil Dimesylate  1 drop Both Eyes Daily  . polyethylene glycol  17 g Oral Daily  . QUEtiapine  50 mg Oral BID   And  . QUEtiapine  300 mg Oral QHS  . senna-docusate  1 tablet Oral BID  . sodium bicarbonate 150 mEq in dextrose 5% 1000 mL  150 mEq Intravenous Once    Continuous Infusions:   LOS: 0 days     Alma Friendly, MD [facility]  If 7PM-7AM, please contact night-coverage [email or web address] 09/05/2019, 4:45 PM

## 2019-09-05 NOTE — Progress Notes (Signed)
Manufacturing engineer [facility])  Ms. Binda is currently enrolled in our community based palliative care program.  Will resume services once she discharges.  Venia Carbon RN, BSN, [facility]iaison [facility] [place]

## 2019-09-05 NOTE — Progress Notes (Signed)
Bladder scan was performed and showed 379 ml retention. Md ordered accurate I &O documentation to monitor patient's renal function. In addition, a Foley was inserted and 600 cc flowed into bag in just a few minutes. It was clear that patient was retaining. Urine characteristics was clear an pale yellow.Patient was calm and  Cooperative during procedure.  1000 cc Iv Na+bicarb started at 125 cc/hr.

## 2019-09-05 NOTE — Progress Notes (Addendum)
Initial Nutrition Assessment  INTERVENTION:   -Ensure Enlive po BID, each supplement provides 350 kcal and 20 grams of protein -Magic cup BID with meals, each supplement provides 290 kcal and 9 grams of protein -Multivitamin with minerals daily  -Weigh patient if able  NUTRITION DIAGNOSIS:   Inadequate oral intake related to lethargy/confusion as evidenced by per patient/family report.  Ongoing.  GOAL:   Patient will meet greater than or equal to 90% of their needs  Not meeting.  MONITOR:   PO intake, Supplement acceptance, Labs, Weight trends, I & O's  REASON FOR ASSESSMENT:   Consult Poor PO, Assessment of nutrition requirement/status  ASSESSMENT:   74 y.o. female with medical history significant for dementia, hypertension, CKD, CVA, depression, glaucoma, cataracts, was brought to the ED by her daughter complaining of poor oral intake for the past 2-3 weeks, only able to drink Ensure and small amounts of water/juice intermittently.  Not taking any medication for the past 3 days.  Patient also has not had any bowel movements in the past couple of days.  Patient has significant history of dementia with visual hallucinations and insomnia.  Patient is under the care of palliative as an outpatient.  EDP had an extensive discussion with daughter, patient is currently DNR and daughters specifically stated no SNF admission, wants patient back home once hydrated and stabilized.  **RD working remotely**  Pt alert/oriented x 1. Pt with history of CVA. Per family report, pt has not been eating/drinking well for ~2-3 weeks PTA. Pt eventually was not taking medications either. Pt had drank some Ensures during this time but not consistently.   Pt has been ordered for a regular diet. No meals have been ordered. Given mental status, will likely need house trays sent at this time. Will order Ensure and Magic Cups for additional kcals and protein.  Per weight records, no weight has been  measured for this admission. Last recorded weight from September 2020 (151 lbs).  Labs reviewed. Medications reviewed.   NUTRITION - FOCUSED PHYSICAL EXAM:  Working remotely.  Diet Order:   Diet Order            Diet regular Room service appropriate? Yes; Fluid consistency: Thin  Diet effective now              EDUCATION NEEDS:   No education needs have been identified at this time  Skin:  Skin Assessment: Reviewed RN Assessment  Last BM:  PTA  Height:   Ht Readings from Last 1 Encounters:  04/04/19 5\' 7"  (1.702 m)    Weight:   Wt Readings from Last 1 Encounters:  04/04/19 68.7 kg    Ideal Body Weight:  61.3 kg  BMI:  There is no weight on file for admission to calculate BMI.  Estimated Nutritional Needs:   Kcal:  1550-1750 -using IBW  Protein:  70-80g  Fluid:  1.7L/day  Clayton Bibles, MS, RD, LDN Inpatient Clinical Dietitian Contact information available via Amion

## 2019-09-06 LAB — BASIC METABOLIC PANEL
Anion gap: 15 (ref 5–15)
BUN: 43 mg/dL — ABNORMAL HIGH (ref 8–23)
CO2: 23 mmol/L (ref 22–32)
Calcium: 8.5 mg/dL — ABNORMAL LOW (ref 8.9–10.3)
Chloride: 108 mmol/L (ref 98–111)
Creatinine, Ser: 2.49 mg/dL — ABNORMAL HIGH (ref 0.44–1.00)
GFR calc Af Amer: 21 mL/min — ABNORMAL LOW (ref >60)
GFR calc non Af Amer: 18 mL/min — ABNORMAL LOW (ref >60)
Glucose, Bld: 113 mg/dL — ABNORMAL HIGH (ref 70–99)
Potassium: 2.8 mmol/L — ABNORMAL LOW (ref 3.5–5.1)
Sodium: 146 mmol/L — ABNORMAL HIGH (ref 135–145)

## 2019-09-06 LAB — CBC WITH DIFFERENTIAL/PLATELET
Abs Immature Granulocytes: 0.03 10*3/uL (ref 0.00–0.07)
Basophils Absolute: 0 10*3/uL (ref 0.0–0.1)
Basophils Relative: 0 %
Eosinophils Absolute: 0.3 10*3/uL (ref 0.0–0.5)
Eosinophils Relative: 3 %
HCT: 30.4 % — ABNORMAL LOW (ref 36.0–46.0)
Hemoglobin: 10.4 g/dL — ABNORMAL LOW (ref 12.0–15.0)
Immature Granulocytes: 0 %
Lymphocytes Relative: 23 %
Lymphs Abs: 1.9 10*3/uL (ref 0.7–4.0)
MCH: 30.7 pg (ref 26.0–34.0)
MCHC: 34.2 g/dL (ref 30.0–36.0)
MCV: 89.7 fL (ref 80.0–100.0)
Monocytes Absolute: 1 10*3/uL (ref 0.1–1.0)
Monocytes Relative: 11 %
Neutro Abs: 5.3 10*3/uL (ref 1.7–7.7)
Neutrophils Relative %: 63 %
Platelets: 160 10*3/uL (ref 150–400)
RBC: 3.39 MIL/uL — ABNORMAL LOW (ref 3.87–5.11)
RDW: 14 % (ref 11.5–15.5)
WBC: 8.5 10*3/uL (ref 4.0–10.5)
nRBC: 0 % (ref 0.0–0.2)

## 2019-09-06 LAB — URINE CULTURE

## 2019-09-06 MED ORDER — DEXTROSE-NACL 5-0.45 % IV SOLN: INTRAVENOUS | Status: DC

## 2019-09-06 MED ORDER — POTASSIUM CHLORIDE 10 MEQ/100ML IV SOLN
10.0000 meq | INTRAVENOUS | Status: AC
  Administered 2019-09-06 (×3): 10 meq via INTRAVENOUS
  Filled 2019-09-06 (×3): qty 100

## 2019-09-06 MED ORDER — POTASSIUM CHLORIDE 10 MEQ/100ML IV SOLN
INTRAVENOUS | Status: AC
  Administered 2019-09-06: 15:00:00 10 meq
  Filled 2019-09-06: qty 100

## 2019-09-06 NOTE — Progress Notes (Signed)
PROGRESS NOTE  Stacy Brennan IOX:[id number] DOB: 1945-08-03 DOA: 09/04/2019 PCP: Dorothyann Peng, NP  HPI/Recap of past 24 hours: Stacy Mae Dost is a 74 y.o. female with medical history significant for dementia, hypertension, CKD, CVA, depression, glaucoma, cataracts, was brought to the ED by her daughter complaining of poor oral intake for the past 2-3 weeks, only able to drink Ensure and small amounts of water/juice intermittently.  Not taking any medication for the past 3 days. Patient also has not had any bowel movements in the past couple of days.  Patient has significant history of dementia with visual hallucinations and insomnia.  Patient is under the care of palliative as an outpatient.  EDP had an extensive discussion with daughter, specifically stated no SNF admission, wants patient back home once hydrated and stabilized. In the ED, VSS except for uncontrolled BP, labs showed creatinine of 3.84 above baseline around 1.2, BUN 116, hemoglobin 12.6 which is higher than her baseline around 10.  COVID-19 test pending, UA/UC/chest x-ray currently are pending.  CT abdomen/pelvis done was unremarkable.  EDP disimpacted patient as well as suppositories with good results.  Patient admitted for IV hydration and stabilization.     Today, met patient shortly after breakfast, was able to drink strawberry Ensure, denies any new complaints, denies any chest pain, shortness of breath, fever/chills, nausea/vomiting, abdominal pain.    Assessment/Plan: Principal Problem:   AKI (acute kidney injury) ([place]) Active Problems:   Stroke ([place])   Hypertension   Glaucoma   Anxiety state   Hyperlipidemia   Renal insufficiency   Depression   Poor oral intake/failure to thrive Likely related to ??progressive dementia Poor oral intake for the past 2 to 3 weeks CT abdomen/pelvis unremarkable Rule out any source of infection, UA negative, UC showed multiple species Chest x-ray unremarkable, COVID-19  negative IV fluids Dietitian consulted  AKI on CKD stage III with metabolic acidosis Likely 2/2 above Metabolic acidosis improved Creatinine on admission 3.84, baseline around 1.23 Continue IV fluids, stopped IV fluids with bicarb CT abdomen/pelvis showed no renal abnormalities Daily BMP  Hypokalemia Replace as needed  Acute urinary retention Insert Foley Strict I's and O's Plan for voiding trial  Constipation EDP disimpacted patient with good results in the ED Bowel regimen  Hypertension Hydralazine as needed, labetalol as needed Hold home p.o. meds for now  History of CVA Continue home aspirin, Lipitor  History of glaucoma/cataracts/legally blind Continue multiple eyedrops  Dementia with behavioral disturbance History of visual hallucination, insomnia, anxiety Continue quetiapine, Ativan as needed Pt will require safety sitter  GOC Discussed extensively with daughter is on 09/04/2019, wants patient to be full code Currently being followed by outpatient palliative care       Malnutrition Type:  Nutrition Problem: Inadequate oral intake Etiology: lethargy/confusion   Malnutrition Characteristics:  Signs/Symptoms: per patient/family report   Nutrition Interventions:  Interventions: Ensure Enlive (each supplement provides 350kcal and 20 grams of protein), MVI    Estimated body mass index is 22.79 kg/m as calculated from the following:   Height as of 04/04/19: '5\' 7"'$  (1.702 m).   Weight as of this encounter: 66 kg.     Code Status: Full  Family Communication: Discussed with daughter is on 09/04/2019  Disposition Plan: Likely home, pending renal function and PT/OT evaluation   Consultants:  None  Procedures:  None  Antimicrobials:  None  DVT prophylaxis: Heparin [place]   Objective: Vitals:   09/05/19 2102 09/06/19 0601 09/06/19 0700 09/06/19 1537  BP:  136/69 125/85  123/86  Pulse: (!) 108 94  (!) 109  Resp: '15 16  14  '$ Temp:  99.3 F (37.4 C) 97.6 F (36.4 C)  97.8 F (36.6 C)  TempSrc: Oral Oral  Oral  SpO2: 100% 99%  98%  Weight:   66 kg     Intake/Output Summary (Last 24 hours) at 09/06/2019 1828 Last data filed at 09/06/2019 7681 Gross per 24 hour  Intake --  Output 700 ml  Net -700 ml   Filed Weights   09/06/19 0700  Weight: 66 kg    Exam:  General: NAD, appears lethargic  Cardiovascular: S1, S2 present  Respiratory: CTAB  Abdomen: Soft, nontender, nondistended, bowel sounds present  Musculoskeletal: No bilateral pedal edema noted  Skin: Normal  Psychiatry:  Unable to assess    Data Reviewed: CBC: Recent Labs  Lab 09/04/19 1440 09/05/19 0433 09/06/19 0533  WBC 7.3 9.4 8.5  NEUTROABS 5.4  --  5.3  HGB 12.6 10.8* 10.4*  HCT 39.6 34.1* 30.4*  MCV 92.5 95.0 89.7  PLT 241 185 157   Basic Metabolic Panel: Recent Labs  Lab 09/04/19 1440 09/05/19 0433 09/06/19 0533  NA 145 145 146*  K 4.8 4.2 2.8*  CL 115* 123* 108  CO2 17* 9* 23  GLUCOSE 116* 82 113*  BUN 78* 59* 43*  CREATININE 3.84* 3.11* 2.49*  CALCIUM 10.3 9.4 8.5*   GFR: Estimated Creatinine Clearance: 19.3 mL/min (A) (by C-G formula based on SCr of 2.49 mg/dL (H)). Liver Function Tests: Recent Labs  Lab 09/04/19 1440 09/05/19 0433  AST 22 29  ALT 27 22  ALKPHOS 116 94  BILITOT 1.0 0.8  PROT 9.1* 7.2  ALBUMIN 4.7 3.8   Recent Labs  Lab 09/04/19 1440  LIPASE 59*   No results for input(s): AMMONIA in the last 168 hours. Coagulation Profile: No results for input(s): INR, PROTIME in the last 168 hours. Cardiac Enzymes: No results for input(s): CKTOTAL, CKMB, CKMBINDEX, TROPONINI in the last 168 hours. BNP (last 3 results) No results for input(s): PROBNP in the last 8760 hours. HbA1C: No results for input(s): HGBA1C in the last 72 hours. CBG: No results for input(s): GLUCAP in the last 168 hours. Lipid Profile: No results for input(s): CHOL, HDL, LDLCALC, TRIG, CHOLHDL, LDLDIRECT in the last 72  hours. Thyroid Function Tests: No results for input(s): TSH, T4TOTAL, FREET4, T3FREE, THYROIDAB in the last 72 hours. Anemia Panel: No results for input(s): VITAMINB12, FOLATE, FERRITIN, TIBC, IRON, RETICCTPCT in the last 72 hours. Urine analysis:    Component Value Date/Time   COLORURINE YELLOW 09/05/2019 1002   APPEARANCEUR CLEAR 09/05/2019 1002   LABSPEC 1.016 09/05/2019 1002   PHURINE 6.0 09/05/2019 1002   GLUCOSEU NEGATIVE 09/05/2019 1002   HGBUR NEGATIVE 09/05/2019 1002   [place] 09/05/2019 1002   BILIRUBINUR Negative 07/21/2019 1554   KETONESUR 20 (A) 09/05/2019 1002   PROTEINUR 30 (A) 09/05/2019 1002   UROBILINOGEN 0.2 07/21/2019 1554   NITRITE NEGATIVE 09/05/2019 1002   LEUKOCYTESUR NEGATIVE 09/05/2019 1002   Sepsis Labs: '@LABRCNTIP'$ (procalcitonin:4,lacticidven:4)  ) Recent Results (from the past 240 hour(s))  SARS CORONAVIRUS 2 (TAT 6-24 HRS) Nasopharyngeal Nasopharyngeal Swab     Status: None   Collection Time: 09/04/19  4:54 PM   Specimen: Nasopharyngeal Swab  Result Value Ref Range Status   SARS Coronavirus 2 NEGATIVE NEGATIVE Final    Comment: (NOTE) SARS-CoV-2 target nucleic acids are NOT DETECTED. The SARS-CoV-2 RNA is generally detectable in upper and  lower respiratory specimens during the acute phase of infection. Negative results do not preclude SARS-CoV-2 infection, do not rule out co-infections with other pathogens, and should not be used as the sole basis for treatment or other patient management decisions. Negative results must be combined with clinical observations, patient history, and epidemiological information. The expected result is Negative. Fact Sheet for Patients: SugarRoll.be Fact Sheet for Healthcare Providers: https://www.woods-mathews.com/ This test is not yet approved or cleared by the [place] FDA and  has been authorized for detection and/or diagnosis of SARS-CoV-2 by FDA  under an Emergency Use Authorization (EUA). This EUA will remain  in effect (meaning this test can be used) for the duration of the COVID-19 declaration under Section 56 4(b)(1) of the Act, 21 U.S.C. section 360bbb-3(b)(1), unless the authorization is terminated or revoked sooner. Performed at [facility], [place] [address] [place], [place] [address]   Urine culture     Status: Abnormal   Collection Time: 09/05/19 10:02 AM   Specimen: Urine, Clean Catch  Result Value Ref Range Status   Specimen Description   Final    URINE, CLEAN CATCH Performed at [facility], [place] [address] [place], [place] [address]    Special Requests   Final    NONE Performed at [facility], [place] [address], [place], [place] [address]    Culture MULTIPLE SPECIES PRESENT, SUGGEST RECOLLECTION (A)  Final   Report Status 09/06/2019 FINAL  Final      Studies: No results found.  Scheduled Meds: . aspirin EC  81 mg Oral Daily  . atorvastatin  20 mg Oral Daily  . brimonidine  1 drop Both Eyes BID  . dorzolamide-timolol  1 drop Both Eyes BID  . feeding supplement (ENSURE ENLIVE)  237 mL Oral BID BM  . heparin  5,000 Units Subcutaneous Q8H  . latanoprost  1 drop Both Eyes QHS  . multivitamin with minerals  1 tablet Oral Daily  . Netarsudil Dimesylate  1 drop Both Eyes Daily  . polyethylene glycol  17 g Oral Daily  . QUEtiapine  50 mg Oral BID   And  . QUEtiapine  300 mg Oral QHS  . senna-docusate  1 tablet Oral BID    Continuous Infusions: . dextrose 5 % and 0.45% NaCl 100 mL/hr at 09/06/19 1819     LOS: 1 day     Alma Friendly, MD [facility]  If 7PM-7AM, please contact night-coverage [email or web address] 09/06/2019, 6:28 PM

## 2019-09-06 NOTE — Progress Notes (Signed)
Occupational Therapy Evaluation Patient Details Name: Stacy Brennan MRN: [id number] DOB: 02/11/1946 Today's Date: 09/06/2019    History of Present Illness Stacy Brennan is a 74 y.o. female who presents to [facility] due to p.o. intake with fatigue. PMG significant of dementia, hypertension, CKD, CVA, depression, glaucoma, and cataracts.   Clinical Impression   Pt presents with above diagnosis. PTA pt PLOF mod I with assistance as needed for ADLs and IADLs living with family. No AE reported. Pt currently limited with safe ADL function due to fatigue, weakness, and decreased stability with functional transfers. Pt will benefit from additional acute OT to address improved strength, activity tolerance, functional transfer, and AE education prior to return to home setting. DC recommendation to [facility] to maximize independence and safety with ADLs in home environment with support.     Follow Up Recommendations  Home health OT;Supervision/Assistance - 24 hour    Equipment Recommendations  3 in 1 bedside commode    Recommendations for Other Services       Precautions / Restrictions Restrictions Weight Bearing Restrictions: No      Mobility Bed Mobility Overal bed mobility: Needs Assistance Bed Mobility: Supine to Sit;Sit to Supine     Supine to sit: Mod assist Sit to supine: Mod assist   General bed mobility comments: Pt required assist with elevation of trunk and to manage BLE to EOB. Pt demonstrate some decreased trunk support in sitting, likely due to sleepiness or fatigue.   Transfers Overall transfer level: Needs assistance Equipment used: 2 person hand held assist Transfers: Sit to/from Stand Sit to Stand: Min assist         General transfer comment: min A sit to stand with VC's for sequencing and safety due to decreased vision for direction.     Balance Overall balance assessment: Needs assistance Sitting-balance support: Bilateral upper extremity supported;Feet  supported Sitting balance-Leahy Scale: Poor     Standing balance support: Bilateral upper extremity supported Standing balance-Leahy Scale: Zero                             ADL either performed or assessed with clinical judgement   ADL Overall ADL's : Needs assistance/impaired Eating/Feeding: Supervision/ safety   Grooming: Wash/dry hands;Wash/dry face;Oral care;Set up;Supervision/safety           Upper Body Dressing : Minimal assistance;Moderate assistance   Lower Body Dressing: Minimal assistance;Moderate assistance(due to level of arousal and alertness.)   Toilet Transfer: Moderate assistance;+2 for physical assistance;+2 for safety/equipment;Ambulation(2 hand held assist) Toilet Transfer Details (indicate cue type and reason): Pt limited with functional transfer due to fatigue and decrease of BP, after functional transfer BP dropped to 84/62, with an increase to 104/69 once positioned in supine.          Functional mobility during ADLs: Moderate assistance;+2 for physical assistance;+2 for safety/equipment;Cueing for sequencing;Cueing for safety General ADL Comments: Session limited due to pt observed to be fatigued, however, able to demonstrate simulated toilet transfer from bed <> bed     Vision         Perception     Praxis      Pertinent Vitals/Pain Pain Assessment: No/denies pain     Hand Dominance Right   Extremity/Trunk Assessment Upper Extremity Assessment Upper Extremity Assessment: Generalized weakness   Lower Extremity Assessment Lower Extremity Assessment: Defer to PT evaluation       Communication Communication Communication: No difficulties   Cognition Arousal/Alertness:  Lethargic Behavior During Therapy: Restless Overall Cognitive Status: History of cognitive impairments - at baseline                                     General Comments  daughter present at bed side    Exercises     Shoulder  Instructions      Home Living Family/patient expects to be discharged to:: Private residence Living Arrangements: Children Available Help at Discharge: Family Type of Home: House Home Access: Level entry     Home Layout: One level     Bathroom Shower/Tub: Teacher, early years/pre: Standard     Home Equipment: Grab bars - tub/shower   Additional Comments: Pt's daughter reports limited AE in home environment.       Prior Functioning/Environment Level of Independence: Independent with assistive device(s)        Comments: Pt reports no assistive device for ambulation        OT Problem List: Decreased activity tolerance;Impaired balance (sitting and/or standing);Decreased safety awareness;Decreased knowledge of use of DME or AE      OT Treatment/Interventions: Self-care/ADL training;Therapeutic exercise;DME and/or AE instruction;Therapeutic activities;Patient/family education;Balance training    OT Goals(Current goals can be found in the care plan section) Acute Rehab OT Goals Patient Stated Goal: to return home OT Goal Formulation: With patient Time For Goal Achievement: 09/20/19 Potential to Achieve Goals: Fair  OT Frequency: Min 2X/week   Barriers to D/C: Inaccessible home environment  decreased AE       Co-evaluation PT/OT/SLP Co-Evaluation/Treatment: Yes Reason for Co-Treatment: Complexity of the patient's impairments (multi-system involvement);For patient/therapist safety;To address functional/ADL transfers   OT goals addressed during session: ADL's and self-care;Strengthening/ROM      AM-PAC OT "6 Clicks" Daily Activity     Outcome Measure Help from another person eating meals?: A Little Help from another person taking care of personal grooming?: A Little Help from another person toileting, which includes using toliet, bedpan, or urinal?: Total Help from another person bathing (including washing, rinsing, drying)?: A Lot Help from another  person to put on and taking off regular upper body clothing?: A Little Help from another person to put on and taking off regular lower body clothing?: A Little 6 Click Score: 15   End of Session Equipment Utilized During Treatment: Gait belt Nurse Communication: Mobility status  Activity Tolerance: Patient limited by fatigue Patient left: in bed;with call bell/phone within reach;with family/visitor present;with bed alarm set  OT Visit Diagnosis: Unsteadiness on feet (R26.81);Muscle weakness (generalized) (M62.81)                Time: [id number] OT Time Calculation (min): 41 min Charges:  OT General Charges $OT Visit: 1 Visit OT Evaluation $OT Eval Low Complexity: 1 Low OT Treatments $Self Care/Home Management : 8-22 mins  Minus Breeding, MSOT, OTR/L  Supplemental Rehabilitation Services  [phone]   Marius Ditch 09/06/2019, 3:15 PM

## 2019-09-06 NOTE — Evaluation (Signed)
Physical Therapy Evaluation Patient Details Name: Stacy Brennan MRN: [id number] DOB: 08-10-1945 Today's Date: 09/06/2019   History of Present Illness  Stacy Mae Routon is a 74 y.o. female who presents to [facility] due to p.o. intake with fatigue. PMG significant of dementia, hypertension, CKD, CVA, depression, glaucoma, and cataracts.  Clinical Impression  Pt admitted as above and presenting with functional mobility limitations 2* generalized weakness, ambulatory balance deficits, questionable safety awareness (dementia), blindness and orthostatic hypotension (BP 84/62 after ambulating short distance).  Pt's dtr present during session and states pt will be returning home with assist of family and SNF is not an option at this point.    Follow Up Recommendations Home health PT    Equipment Recommendations  None recommended by PT    Recommendations for Other Services OT consult     Precautions / Restrictions Precautions Precautions: Fall Precaution Comments: Pt is legally blind Restrictions Weight Bearing Restrictions: No      Mobility  Bed Mobility Overal bed mobility: Needs Assistance Bed Mobility: Supine to Sit;Sit to Supine     Supine to sit: Mod assist Sit to supine: Mod assist   General bed mobility comments: Pt required assist with elevation of trunk and to manage BLE to EOB. Pt demonstrate some decreased trunk support in sitting, likely due to sleepiness or fatigue.   Transfers Overall transfer level: Needs assistance Equipment used: 2 person hand held assist Transfers: Sit to/from Stand Sit to Stand: Min assist;+2 physical assistance;From elevated surface;+2 safety/equipment         General transfer comment: min A sit to stand with VC's for sequencing and safety due to decreased vision for direction.   Ambulation/Gait Ambulation/Gait assistance: Min assist;+2 physical assistance;+2 safety/equipment Gait Distance (Feet): 38 Feet Assistive device: 2 person hand  held assist Gait Pattern/deviations: Step-to pattern;Step-through pattern;Decreased step length - right;Decreased step length - left;Shuffle;Trunk flexed;Staggering left;Staggering right;Narrow base of support Gait velocity: decr   General Gait Details: cues for posture and BOS; Pt with increasing instability with increased distance ambulated - pt denies dizziness but with BP 84/62 upon sitting  Stairs            Wheelchair Mobility    Modified Rankin (Stroke Patients Only)       Balance Overall balance assessment: Needs assistance Sitting-balance support: Bilateral upper extremity supported;Feet supported Sitting balance-Leahy Scale: Poor     Standing balance support: Bilateral upper extremity supported Standing balance-Leahy Scale: Zero                               Pertinent Vitals/Pain Pain Assessment: No/denies pain    Home Living Family/patient expects to be discharged to:: Private residence Living Arrangements: Children Available Help at Discharge: Family Type of Home: House Home Access: Level entry     Home Layout: One level Home Equipment: Grab bars - tub/shower Additional Comments: Pt's daughter reports limited AE in home environment.     Prior Function Level of Independence: Independent         Comments: Pt requiring HHA for ambulation just prior to admit 2* weakness     Hand Dominance   Dominant Hand: Right    Extremity/Trunk Assessment   Upper Extremity Assessment Upper Extremity Assessment: Generalized weakness    Lower Extremity Assessment Lower Extremity Assessment: Generalized weakness       Communication   Communication: No difficulties  Cognition Arousal/Alertness: Lethargic Behavior During Therapy: Restless Overall Cognitive Status: History  of cognitive impairments - at baseline                                        General Comments General comments (skin integrity, edema, etc.): dtr  present and very supportive    Exercises     Assessment/Plan    PT Assessment Patient needs continued PT services  PT Problem List Decreased strength;Decreased activity tolerance;Decreased balance;Decreased mobility;Decreased cognition;Decreased knowledge of use of DME;Decreased safety awareness       PT Treatment Interventions DME instruction;Gait training;Functional mobility training;Therapeutic activities;Therapeutic exercise;Patient/family education;Balance training    PT Goals (Current goals can be found in the Care Plan section)  Acute Rehab PT Goals Patient Stated Goal: to return home PT Goal Formulation: With patient Time For Goal Achievement: 09/20/19 Potential to Achieve Goals: Fair    Frequency Min 3X/week   Barriers to discharge        Co-evaluation PT/OT/SLP Co-Evaluation/Treatment: Yes Reason for Co-Treatment: For patient/therapist safety PT goals addressed during session: Mobility/safety with mobility OT goals addressed during session: ADL's and self-care       AM-PAC PT "6 Clicks" Mobility  Outcome Measure Help needed turning from your back to your side while in a flat bed without using bedrails?: A Lot Help needed moving from lying on your back to sitting on the side of a flat bed without using bedrails?: A Lot Help needed moving to and from a bed to a chair (including a wheelchair)?: A Lot Help needed standing up from a chair using your arms (e.g., wheelchair or bedside chair)?: A Lot Help needed to walk in hospital room?: A Lot Help needed climbing 3-5 steps with a railing? : Total 6 Click Score: 11    End of Session Equipment Utilized During Treatment: Gait belt Activity Tolerance: Patient limited by fatigue Patient left: in bed;with call bell/phone within reach;with bed alarm set;with family/visitor present Nurse Communication: Mobility status(BP) PT Visit Diagnosis: Unsteadiness on feet (R26.81);Muscle weakness (generalized) (M62.81);Difficulty  in walking, not elsewhere classified (R26.2)    Time: 1340-1410 PT Time Calculation (min) (ACUTE ONLY): 30 min   Charges:   PT Evaluation $PT Eval Low Complexity: 1 Low          Debe Coder PT Acute Rehabilitation Services Pager [phone] Office [phone]   Keshun Berrett 09/06/2019, 3:37 PM

## 2019-09-07 LAB — CBC WITH DIFFERENTIAL/PLATELET
Abs Immature Granulocytes: 0.03 10*3/uL (ref 0.00–0.07)
Basophils Absolute: 0 10*3/uL (ref 0.0–0.1)
Basophils Relative: 0 %
Eosinophils Absolute: 0.2 10*3/uL (ref 0.0–0.5)
Eosinophils Relative: 3 %
HCT: 29.3 % — ABNORMAL LOW (ref 36.0–46.0)
Hemoglobin: 9.5 g/dL — ABNORMAL LOW (ref 12.0–15.0)
Immature Granulocytes: 0 %
Lymphocytes Relative: 21 %
Lymphs Abs: 1.6 10*3/uL (ref 0.7–4.0)
MCH: 29.7 pg (ref 26.0–34.0)
MCHC: 32.4 g/dL (ref 30.0–36.0)
MCV: 91.6 fL (ref 80.0–100.0)
Monocytes Absolute: 0.9 10*3/uL (ref 0.1–1.0)
Monocytes Relative: 12 %
Neutro Abs: 4.7 10*3/uL (ref 1.7–7.7)
Neutrophils Relative %: 64 %
Platelets: 148 10*3/uL — ABNORMAL LOW (ref 150–400)
RBC: 3.2 MIL/uL — ABNORMAL LOW (ref 3.87–5.11)
RDW: 13.9 % (ref 11.5–15.5)
WBC: 7.4 10*3/uL (ref 4.0–10.5)
nRBC: 0 % (ref 0.0–0.2)

## 2019-09-07 LAB — BASIC METABOLIC PANEL
Anion gap: 8 (ref 5–15)
BUN: 31 mg/dL — ABNORMAL HIGH (ref 8–23)
CO2: 22 mmol/L (ref 22–32)
Calcium: 8.2 mg/dL — ABNORMAL LOW (ref 8.9–10.3)
Chloride: 111 mmol/L (ref 98–111)
Creatinine, Ser: 2.21 mg/dL — ABNORMAL HIGH (ref 0.44–1.00)
GFR calc Af Amer: 25 mL/min — ABNORMAL LOW (ref >60)
GFR calc non Af Amer: 21 mL/min — ABNORMAL LOW (ref >60)
Glucose, Bld: 113 mg/dL — ABNORMAL HIGH (ref 70–99)
Potassium: 3.3 mmol/L — ABNORMAL LOW (ref 3.5–5.1)
Sodium: 141 mmol/L (ref 135–145)

## 2019-09-07 MED ORDER — POTASSIUM CHLORIDE 10 MEQ/100ML IV SOLN
10.0000 meq | INTRAVENOUS | Status: AC
  Administered 2019-09-07 (×3): 10 meq via INTRAVENOUS
  Filled 2019-09-07 (×3): qty 100

## 2019-09-07 MED ORDER — POTASSIUM CHLORIDE 10 MEQ/100ML IV SOLN: 10.0000 meq | INTRAVENOUS | Status: DC

## 2019-09-07 NOTE — Progress Notes (Signed)
Physical Therapy Treatment Patient Details Name: Stacy Brennan MRN: [id number] DOB: 10-02-1945 Today's Date: 09/07/2019    History of Present Illness Stacy Brennan is a 74 y.o. female who presents to [facility] due to p.o. intake with fatigue. PMG significant of dementia, hypertension, CKD, CVA, depression, glaucoma, and cataracts.    PT Comments    Pt sleepy but rousable - requiring increased time and encouragement for completion of all task.  Pt c/o dizziness with move to chair with orthostatic BPs as follows; supine121/66; sit 107/83; stand 102/63; after taking few steps to chair 99/56 with c/o dizziness; and with feet up in recliner 120/79 - RN aware.   Follow Up Recommendations  Home health PT     Equipment Recommendations  None recommended by PT    Recommendations for Other Services OT consult     Precautions / Restrictions Precautions Precautions: Fall Precaution Comments: Pt is legally blind Restrictions Weight Bearing Restrictions: No    Mobility  Bed Mobility Overal bed mobility: Needs Assistance Bed Mobility: Supine to Sit     Supine to sit: Mod assist     General bed mobility comments: Pt required assist with elevation of trunk and to manage BLE to EOB.  Transfers Overall transfer level: Needs assistance Equipment used: Rolling walker (2 wheeled) Transfers: Sit to/from Stand Sit to Stand: Min assist;+2 physical assistance;From elevated surface;+2 safety/equipment         General transfer comment: min A sit to stand with VC's for sequencing and safety due to decreased vision for direction.   Ambulation/Gait Ambulation/Gait assistance: Min assist;+2 physical assistance;+2 safety/equipment Gait Distance (Feet): 5 Feet Assistive device: Rolling walker (2 wheeled) Gait Pattern/deviations: Step-to pattern;Step-through pattern;Decreased step length - right;Decreased step length - left;Shuffle;Trunk flexed;Staggering left;Staggering right;Narrow base of  support Gait velocity: decr   General Gait Details: cues for posture, position from RW and BOS; Pt reports dizziness in standing and assisted to chair   Stairs             Wheelchair Mobility    Modified Rankin (Stroke Patients Only)       Balance Overall balance assessment: Needs assistance Sitting-balance support: Bilateral upper extremity supported;Feet supported Sitting balance-Leahy Scale: Fair     Standing balance support: Bilateral upper extremity supported Standing balance-Leahy Scale: Poor                              Cognition Arousal/Alertness: Lethargic Behavior During Therapy: WFL for tasks assessed/performed Overall Cognitive Status: History of cognitive impairments - at baseline                                        Exercises      General Comments        Pertinent Vitals/Pain Pain Assessment: No/denies pain    Home Living                      Prior Function            PT Goals (current goals can now be found in the care plan section) Acute Rehab PT Goals Patient Stated Goal: to return home PT Goal Formulation: With patient Time For Goal Achievement: 09/20/19 Potential to Achieve Goals: Fair Progress towards PT goals: Not progressing toward goals - comment(orthostatic)    Frequency    Min 3X/week  PT Plan Current plan remains appropriate    Co-evaluation              AM-PAC PT "6 Clicks" Mobility   Outcome Measure  Help needed turning from your back to your side while in a flat bed without using bedrails?: A Little Help needed moving from lying on your back to sitting on the side of a flat bed without using bedrails?: A Lot Help needed moving to and from a bed to a chair (including a wheelchair)?: A Lot Help needed standing up from a chair using your arms (e.g., wheelchair or bedside chair)?: A Lot Help needed to walk in hospital room?: A Lot Help needed climbing 3-5 steps  with a railing? : Total 6 Click Score: 12    End of Session Equipment Utilized During Treatment: Gait belt Activity Tolerance: Patient limited by fatigue Patient left: in chair;with call bell/phone within reach;with chair alarm set Nurse Communication: Mobility status PT Visit Diagnosis: Unsteadiness on feet (R26.81);Muscle weakness (generalized) (M62.81);Difficulty in walking, not elsewhere classified (R26.2)     Time: [id number] PT Time Calculation (min) (ACUTE ONLY): 21 min  Charges:  $Therapeutic Activity: 8-22 mins                     Debe Coder PT Acute Rehabilitation Services Pager [phone] Office [phone]    Stacy Brennan 09/07/2019, 4:09 PM

## 2019-09-07 NOTE — Progress Notes (Signed)
Manufacturing engineer [facility])  Ms. Stacy Brennan is our current palliative pt in the community.  [facility] will follow while hospitalized and resume services once discharged.  Venia Carbon RN, BSN, [facility] Liaison  [phone]

## 2019-09-07 NOTE — Progress Notes (Signed)
Work with pt taking po meds but was unable to administer. Pt spit them out even when crushed and placed in applesauce. Not following command "to swallow". Attempted to have pt sip straw, but just blew on it. Contacted MD to consider a speech evaluation.

## 2019-09-07 NOTE — Progress Notes (Signed)
PROGRESS NOTE  Stacy Brennan UUE:[id number] DOB: June 02, 1946 DOA: 09/04/2019 PCP: Dorothyann Peng, NP  HPI/Recap of past 24 hours: Stacy Brennan is a 74 y.o. female with medical history significant for dementia, hypertension, CKD, CVA, depression, glaucoma, cataracts, was brought to the ED by her daughter complaining of poor oral intake for the past 2-3 weeks, only able to drink Ensure and small amounts of water/juice intermittently.  Not taking any medication for the past 3 days. Patient also has not had any bowel movements in the past couple of days.  Patient has significant history of dementia with visual hallucinations and insomnia.  Patient is under the care of palliative as an outpatient.  EDP had an extensive discussion with daughter, specifically stated no SNF admission, wants patient back home once hydrated and stabilized. In the ED, VSS except for uncontrolled BP, labs showed creatinine of 3.84 above baseline around 1.2, BUN 116, hemoglobin 12.6 which is higher than her baseline around 10.  COVID-19 test pending, UA/UC/chest x-ray currently are pending.  CT abdomen/pelvis done was unremarkable.  EDP disimpacted patient as well as suppositories with good results.  Patient admitted for IV hydration and stabilization.    Today, met patient laying comfortably in bed.  Denies any new complaints.  Noted to be spitting out her pills/food.  Speech eval pending    Assessment/Plan: Principal Problem:   AKI (acute kidney injury) ([place]) Active Problems:   Stroke ([place])   Hypertension   Glaucoma   Anxiety state   Hyperlipidemia   Renal insufficiency   Depression   Poor oral intake/failure to thrive Likely related to ??progressive dementia Poor oral intake for the past 2 to 3 weeks CT abdomen/pelvis unremarkable Rule out any source of infection, UA negative, UC showed multiple species Chest x-ray unremarkable, COVID-19 negative IV fluids Dietitian consulted  AKI on CKD stage III  with metabolic acidosis Likely 2/2 above Metabolic acidosis improved Creatinine on admission 3.84, baseline around 1.23 Continue IV fluids, stopped IV fluids with bicarb CT abdomen/pelvis showed no renal abnormalities Daily BMP  Hypokalemia Replace as needed  Acute urinary retention Insert Foley Strict I's and O's Plan for voiding trial  Constipation EDP disimpacted patient with good results in the ED Bowel regimen  Hypertension Hydralazine as needed, labetalol as needed Hold home p.o. meds for now  History of CVA Continue home aspirin, Lipitor  History of glaucoma/cataracts/legally blind Continue multiple eyedrops  Dementia with behavioral disturbance History of visual hallucination, insomnia, anxiety Continue quetiapine, Ativan as needed Pt will require safety sitter  GOC Discussed extensively with daughter is on 09/04/2019, wants patient to be full code Currently being followed by outpatient palliative care       Malnutrition Type:  Nutrition Problem: Inadequate oral intake Etiology: lethargy/confusion   Malnutrition Characteristics:  Signs/Symptoms: per patient/family report   Nutrition Interventions:  Interventions: Ensure Enlive (each supplement provides 350kcal and 20 grams of protein), MVI    Estimated body mass index is 22.79 kg/m as calculated from the following:   Height as of 04/04/19: '5\' 7"'$  (1.702 m).   Weight as of this encounter: 66 kg.     Code Status: Full  Family Communication: Discussed with daughter is on 09/06/2019  Disposition Plan: Likely home, pending renal function and PT/OT evaluation   Consultants:  None  Procedures:  None  Antimicrobials:  None  DVT prophylaxis: Heparin [place]   Objective: Vitals:   09/06/19 1537 09/06/19 2112 09/07/19 0558 09/07/19 1326  BP: 123/86 114/76 119/74 136/75  Pulse: (!) 109 (!) 107 96 94  Resp: '14 15 17 16  '$ Temp: 97.8 F (36.6 C) (!) 97.4 F (36.3 C) 98.2 F (36.8 C)  97.7 F (36.5 C)  TempSrc: Oral Oral Oral Oral  SpO2: 98% 100% 99% 99%  Weight:        Intake/Output Summary (Last 24 hours) at 09/07/2019 1435 Last data filed at 09/07/2019 1128 Gross per 24 hour  Intake 3001.22 ml  Output 900 ml  Net 2101.22 ml   Filed Weights   09/06/19 0700  Weight: 66 kg    Exam:  General: NAD   Cardiovascular: S1, S2 present  Respiratory: CTAB  Abdomen: Soft, nontender, nondistended, bowel sounds present  Musculoskeletal: No bilateral pedal edema noted  Skin: Normal  Psychiatry: Unable to assess    Data Reviewed: CBC: Recent Labs  Lab 09/04/19 1440 09/05/19 0433 09/06/19 0533 09/07/19 0517  WBC 7.3 9.4 8.5 7.4  NEUTROABS 5.4  --  5.3 4.7  HGB 12.6 10.8* 10.4* 9.5*  HCT 39.6 34.1* 30.4* 29.3*  MCV 92.5 95.0 89.7 91.6  PLT 241 185 160 010*   Basic Metabolic Panel: Recent Labs  Lab 09/04/19 1440 09/05/19 0433 09/06/19 0533 09/07/19 0517  NA 145 145 146* 141  K 4.8 4.2 2.8* 3.3*  CL 115* 123* 108 111  CO2 17* 9* 23 22  GLUCOSE 116* 82 113* 113*  BUN 78* 59* 43* 31*  CREATININE 3.84* 3.11* 2.49* 2.21*  CALCIUM 10.3 9.4 8.5* 8.2*   GFR: Estimated Creatinine Clearance: 21.7 mL/min (A) (by C-G formula based on SCr of 2.21 mg/dL (H)). Liver Function Tests: Recent Labs  Lab 09/04/19 1440 09/05/19 0433  AST 22 29  ALT 27 22  ALKPHOS 116 94  BILITOT 1.0 0.8  PROT 9.1* 7.2  ALBUMIN 4.7 3.8   Recent Labs  Lab 09/04/19 1440  LIPASE 59*   No results for input(s): AMMONIA in the last 168 hours. Coagulation Profile: No results for input(s): INR, PROTIME in the last 168 hours. Cardiac Enzymes: No results for input(s): CKTOTAL, CKMB, CKMBINDEX, TROPONINI in the last 168 hours. BNP (last 3 results) No results for input(s): PROBNP in the last 8760 hours. HbA1C: No results for input(s): HGBA1C in the last 72 hours. CBG: No results for input(s): GLUCAP in the last 168 hours. Lipid Profile: No results for input(s): CHOL,  HDL, LDLCALC, TRIG, CHOLHDL, LDLDIRECT in the last 72 hours. Thyroid Function Tests: No results for input(s): TSH, T4TOTAL, FREET4, T3FREE, THYROIDAB in the last 72 hours. Anemia Panel: No results for input(s): VITAMINB12, FOLATE, FERRITIN, TIBC, IRON, RETICCTPCT in the last 72 hours. Urine analysis:    Component Value Date/Time   COLORURINE YELLOW 09/05/2019 1002   APPEARANCEUR CLEAR 09/05/2019 1002   LABSPEC 1.016 09/05/2019 1002   PHURINE 6.0 09/05/2019 1002   GLUCOSEU NEGATIVE 09/05/2019 1002   HGBUR NEGATIVE 09/05/2019 1002   [place] 09/05/2019 1002   BILIRUBINUR Negative 07/21/2019 1554   KETONESUR 20 (A) 09/05/2019 1002   PROTEINUR 30 (A) 09/05/2019 1002   UROBILINOGEN 0.2 07/21/2019 1554   NITRITE NEGATIVE 09/05/2019 1002   LEUKOCYTESUR NEGATIVE 09/05/2019 1002   Sepsis Labs: '@LABRCNTIP'$ (procalcitonin:4,lacticidven:4)  ) Recent Results (from the past 240 hour(s))  SARS CORONAVIRUS 2 (TAT 6-24 HRS) Nasopharyngeal Nasopharyngeal Swab     Status: None   Collection Time: 09/04/19  4:54 PM   Specimen: Nasopharyngeal Swab  Result Value Ref Range Status   SARS Coronavirus 2 NEGATIVE NEGATIVE Final    Comment: (NOTE) SARS-CoV-2  target nucleic acids are NOT DETECTED. The SARS-CoV-2 RNA is generally detectable in upper and lower respiratory specimens during the acute phase of infection. Negative results do not preclude SARS-CoV-2 infection, do not rule out co-infections with other pathogens, and should not be used as the sole basis for treatment or other patient management decisions. Negative results must be combined with clinical observations, patient history, and epidemiological information. The expected result is Negative. Fact Sheet for Patients: SugarRoll.be Fact Sheet for Healthcare Providers: https://www.woods-mathews.com/ This test is not yet approved or cleared by the [place] FDA and  has been authorized  for detection and/or diagnosis of SARS-CoV-2 by FDA under an Emergency Use Authorization (EUA). This EUA will remain  in effect (meaning this test can be used) for the duration of the COVID-19 declaration under Section 56 4(b)(1) of the Act, 21 U.S.C. section 360bbb-3(b)(1), unless the authorization is terminated or revoked sooner. Performed at [facility], [place] [address] [place], [place] [address]   Urine culture     Status: Abnormal   Collection Time: 09/05/19 10:02 AM   Specimen: Urine, Clean Catch  Result Value Ref Range Status   Specimen Description   Final    URINE, CLEAN CATCH Performed at [facility], [place] [address] [place], [place] [address]    Special Requests   Final    NONE Performed at [facility], [place] [address] [place], [place] [address]    Culture MULTIPLE SPECIES PRESENT, SUGGEST RECOLLECTION (A)  Final   Report Status 09/06/2019 FINAL  Final      Studies: No results found.  Scheduled Meds: . aspirin EC  81 mg Oral Daily  . atorvastatin  20 mg Oral Daily  . brimonidine  1 drop Both Eyes BID  . dorzolamide-timolol  1 drop Both Eyes BID  . feeding supplement (ENSURE ENLIVE)  237 mL Oral BID BM  . heparin  5,000 Units Subcutaneous Q8H  . multivitamin with minerals  1 tablet Oral Daily  . Netarsudil Dimesylate  1 drop Both Eyes Daily  . polyethylene glycol  17 g Oral Daily  . QUEtiapine  50 mg Oral BID   And  . QUEtiapine  300 mg Oral QHS  . senna-docusate  1 tablet Oral BID    Continuous Infusions: . dextrose 5 % and 0.45% NaCl 75 mL/hr at 09/07/19 1115     LOS: 2 days     Alma Friendly, MD [facility]  If 7PM-7AM, please contact night-coverage [email or web address] 09/07/2019, 2:35 PM

## 2019-09-08 LAB — CBC WITH DIFFERENTIAL/PLATELET
Abs Immature Granulocytes: 0.04 10*3/uL (ref 0.00–0.07)
Basophils Absolute: 0 10*3/uL (ref 0.0–0.1)
Basophils Relative: 0 %
Eosinophils Absolute: 0.2 10*3/uL (ref 0.0–0.5)
Eosinophils Relative: 2 %
HCT: 29.2 % — ABNORMAL LOW (ref 36.0–46.0)
Hemoglobin: 9.5 g/dL — ABNORMAL LOW (ref 12.0–15.0)
Immature Granulocytes: 1 %
Lymphocytes Relative: 19 %
Lymphs Abs: 1.6 10*3/uL (ref 0.7–4.0)
MCH: 30 pg (ref 26.0–34.0)
MCHC: 32.5 g/dL (ref 30.0–36.0)
MCV: 92.1 fL (ref 80.0–100.0)
Monocytes Absolute: 0.9 10*3/uL (ref 0.1–1.0)
Monocytes Relative: 11 %
Neutro Abs: 5.6 10*3/uL (ref 1.7–7.7)
Neutrophils Relative %: 67 %
Platelets: 133 10*3/uL — ABNORMAL LOW (ref 150–400)
RBC: 3.17 MIL/uL — ABNORMAL LOW (ref 3.87–5.11)
RDW: 13.6 % (ref 11.5–15.5)
WBC: 8.4 10*3/uL (ref 4.0–10.5)
nRBC: 0 % (ref 0.0–0.2)

## 2019-09-08 LAB — BASIC METABOLIC PANEL
Anion gap: 9 (ref 5–15)
BUN: 21 mg/dL (ref 8–23)
CO2: 19 mmol/L — ABNORMAL LOW (ref 22–32)
Calcium: 7.9 mg/dL — ABNORMAL LOW (ref 8.9–10.3)
Chloride: 109 mmol/L (ref 98–111)
Creatinine, Ser: 1.92 mg/dL — ABNORMAL HIGH (ref 0.44–1.00)
GFR calc Af Amer: 29 mL/min — ABNORMAL LOW (ref >60)
GFR calc non Af Amer: 25 mL/min — ABNORMAL LOW (ref >60)
Glucose, Bld: 108 mg/dL — ABNORMAL HIGH (ref 70–99)
Potassium: 3.3 mmol/L — ABNORMAL LOW (ref 3.5–5.1)
Sodium: 137 mmol/L (ref 135–145)

## 2019-09-08 MED ORDER — LABETALOL HCL 100 MG PO TABS: 200.0000 mg | ORAL_TABLET | Freq: Two times a day (BID) | ORAL | Status: DC

## 2019-09-08 MED ORDER — POTASSIUM CHLORIDE 10 MEQ/100ML IV SOLN
10.0000 meq | INTRAVENOUS | Status: AC
  Administered 2019-09-08 (×4): 10 meq via INTRAVENOUS
  Filled 2019-09-08 (×4): qty 100

## 2019-09-08 MED ORDER — SODIUM CHLORIDE 0.9 % IV SOLN: INTRAVENOUS | Status: DC

## 2019-09-08 NOTE — Progress Notes (Signed)
Occupational Therapy Treatment Patient Details Name: Stacy Brennan MRN: [id number] DOB: August 06, 1945 Today's Date: 09/08/2019    History of present illness Stacy Mae Mccarthy is a 74 y.o. female who presents to [facility] due to p.o. intake with fatigue. PMG significant of dementia, hypertension, CKD, CVA, depression, glaucoma, and cataracts.   OT comments  Pt sat EOB with OT for lunch. Pt did not swallow any food. RN notifed.   Follow Up Recommendations  Home health OT;Supervision/Assistance - 24 hour    Equipment Recommendations  3 in 1 bedside commode    Recommendations for Other Services      Precautions / Restrictions Precautions Precautions: Fall Precaution Comments: Pt is legally blind Restrictions Weight Bearing Restrictions: No       Mobility Bed Mobility Overal bed mobility: Needs Assistance Bed Mobility: Supine to Sit;Sit to Supine       Sit to supine: Mod assist   General bed mobility comments: pt did not stand with OT this day  Transfers                      Balance Overall balance assessment: Needs assistance Sitting-balance support: Bilateral upper extremity supported;Feet supported Sitting balance-Leahy Scale: Fair                                     ADL either performed or assessed with clinical judgement   ADL Overall ADL's : Needs assistance/impaired Eating/Feeding: Minimal assistance Eating/Feeding Details (indicate cue type and reason): sitting EOB.  Pt did not swallow food. she would manipulatein her mouth then spit it out.                                   General ADL Comments: pt did sit EOB with OT for lunch.  Pt needed A with finding food on tray but then would bring to mouth and then spit out. RN notified     Vision Baseline Vision/History: Legally blind Patient Visual Report: No change from baseline     Perception     Praxis      Cognition Arousal/Alertness: Awake/alert Behavior During  Therapy: Flat affect Overall Cognitive Status: No family/caregiver present to determine baseline cognitive functioning                                                     Pertinent Vitals/ Pain       Pain Assessment: No/denies pain         Frequency  Min 2X/week        Progress Toward Goals  OT Goals(current goals can now be found in the care plan section)  Progress towards OT goals: Progressing toward goals     Plan Discharge plan remains appropriate       AM-PAC OT "6 Clicks" Daily Activity     Outcome Measure   Help from another person eating meals?: A Little Help from another person taking care of personal grooming?: A Little Help from another person toileting, which includes using toliet, bedpan, or urinal?: Total Help from another person bathing (including washing, rinsing, drying)?: A Lot Help from another person to put on and taking off regular upper body clothing?:  A Little Help from another person to put on and taking off regular lower body clothing?: A Little 6 Click Score: 15    End of Session    OT Visit Diagnosis: Unsteadiness on feet (R26.81);Muscle weakness (generalized) (M62.81)   Activity Tolerance Patient tolerated treatment well   Patient Left in bed;with call bell/phone within reach;with bed alarm set   Nurse Communication Mobility status        Time: [id number] OT Time Calculation (min): 20 min  Charges: OT General Charges $OT Visit: 1 Visit OT Treatments $Self Care/Home Management : 8-22 mins  Kari Baars, [place] [phone] Office- [place], Edwena Felty D 09/08/2019, 1:36 PM

## 2019-09-08 NOTE — Evaluation (Signed)
Clinical/Bedside Swallow Evaluation Patient Details  Name: Stacy Brennan MRN: [id number] Date of Birth: Jan 25, 1946  Today's Date: 09/08/2019 Time: SLP Start Time (ACUTE ONLY): 47 SLP Stop Time (ACUTE ONLY): 1440 SLP Time Calculation (min) (ACUTE ONLY): 35 min  Past Medical History:  Past Medical History:  Diagnosis Date  . Allergic rhinitis, cause unspecified 01/01/2014  . Anxiety state, unspecified 01/01/2014  . Cataract   . Cognitive impairment   . Dementia ([place])   . Depression 01/01/2014  . Glaucoma   . Hypertension   . Other and unspecified hyperlipidemia 01/01/2014  . Renal insufficiency 01/01/2014  . Stroke [facility]) 2002   CVA x 2  . Unspecified asthma(493.90) 01/01/2014  . Vision decreased    Past Surgical History:  Past Surgical History:  Procedure Laterality Date  . BIOPSY  11/20/2018   Procedure: BIOPSY;  Surgeon: Lavena Bullion, DO;  Location: [facility];  Service: Gastroenterology;;  . DILATION AND CURETTAGE OF UTERUS    . ESOPHAGOGASTRODUODENOSCOPY (EGD) WITH PROPOFOL N/A 11/20/2018   Procedure: ESOPHAGOGASTRODUODENOSCOPY (EGD) WITH PROPOFOL;  Surgeon: Lavena Bullion, DO;  Location: [facility];  Service: Gastroenterology;  Laterality: N/A;  . THROMBECTOMY BRACHIAL ARTERY Left 08/30/2018   Procedure: LEFT SUBCLAVIAN ARTERY THROMBECTOMY;  Surgeon: Waynetta Sandy, MD;  Location: [place];  Service: Vascular;  Laterality: Left;  . TUBALIZATION     HPI:  74 yo female adm to [facility] with AKI.  Pt PMH + for dementia, cataracts/glaucoma *legally blind.  Had poor intake and fatigue.  Swallow eval ordered.  OT reports pt holding food in her mouth during session and she did not swallow her medications with RN.   Assessment / Plan / Recommendation Clinical Impression  Pt presents with moderate cogntiive based oral dysphagia.  PO provided trials due to dysphagia noted.  She was trying to answer a call from her daughter upon SLP entrance to room.  SLP assisted pt to speak  to her daughter and she was on phone with daughter for approximately 5 minutes.  Clear voice noted during phone call.  After pt finished call, she continued to hold phone up to her ear demonstrating decreased ability to adjust attention appropriately.  NT assisted SLP to slide pt up in bed and pt noted to have retained secretions at this time.  SlP attempted to use oral suction *(with pt hand over hand) to clear without success as she would only allow suction to touch anterior part of dentition.  She did however spit out copious frothy secretions with being handed cup and instructed to do so.   This causes SLP to question if she may have some esophageal component but given her cognition, uncertain.  Pt self fed *which allow improved neuro input* ice cream, Ensure "shake" mixed with icecream and gingerale. Items with improved sensory input to system will be likely more readily swallowed by pt.  She did not demonstrate delay in swallow oral oral holding. Testing was limited by pt needing to have a bowel movement.  At this recommend modify to full liquid diet with pt self feeding to maximize nutrition with least risk.  Hopeful for quick dietary advancement as she medically improves.   Pt states she drinks Ensures every day.  Will follow up next date for diagnostic treatment, education and determination for readiness to advance diet.  Advised RN to recommendations. SLP Visit Diagnosis: Dysphagia, oral phase (R13.11)    Aspiration Risk       Diet Recommendation Thin liquid(full liquids)  Liquid Administration via: Cup;Straw Medication Administration: Other (Comment)(try with icecream) Supervision: Patient able to self feed Compensations: Slow rate;Small sips/bites;Other (Comment)(have pt spit after meals/po to assure adequate oral clearance) Postural Changes: Remain upright for at least 30 minutes after po intake;Seated upright at 90 degrees    Other  Recommendations Oral Care Recommendations: Other  (Comment)(oral care BEFORE and AFTER po)   Follow up Recommendations Other (comment)(TBD)      Frequency and Duration min 2x/week  1 week       Prognosis Prognosis for Safe Diet Advancement: Fair Barriers to Reach Goals: Cognitive deficits      Swallow Study   General Date of Onset: 09/08/19 HPI: 74 yo female adm to [facility] with AKI.  Pt PMH + for dementia, cataracts/glaucoma *legally blind.  Had poor intake and fatigue.  Swallow eval ordered.  OT reports pt holding food in her mouth during session and she did not swallow her medications with RN. Type of Study: Bedside Swallow Evaluation Previous Swallow Assessment: none in system Diet Prior to this Study: Regular;Thin liquids Temperature Spikes Noted: No Respiratory Status: Room air History of Recent Intubation: No Behavior/Cognition: Alert;Cooperative;Pleasant mood Oral Cavity Assessment: Within Functional Limits Oral Care Completed by SLP: No Oral Cavity - Dentition: Adequate natural dentition Vision: Functional for self-feeding Self-Feeding Abilities: Able to feed self Patient Positioning: Upright in bed Baseline Vocal Quality: Normal Volitional Cough: Other (Comment);Cognitively unable to elicit Volitional Swallow: Unable to elicit    Oral/Motor/Sensory Function Overall Oral Motor/Sensory Function: Generalized oral weakness   Ice Chips Ice chips: Not tested   Thin Liquid Thin Liquid: Impaired Oral Phase Impairments: Poor awareness of bolus Oral Phase Functional Implications: Oral holding Pharyngeal  Phase Impairments: Suspected delayed Swallow    Nectar Thick Nectar Thick Liquid: Impaired Presentation: Cup;Straw;Spoon Oral Phase Impairments: Poor awareness of bolus Oral phase functional implications: Oral holding Pharyngeal Phase Impairments: Suspected delayed Swallow   Honey Thick Honey Thick Liquid: Not tested   Puree Puree: Impaired Presentation: Self Fed;Spoon Oral Phase Functional Implications: Oral holding    Solid     Solid: Not tested Other Comments: did not test, evaluation was limited due to pt needing to have a bowel movement      Macario Golds 09/08/2019,3:08 PM   Kathleen Lime, MS [place] Office [phone]

## 2019-09-08 NOTE — TOC Initial Note (Signed)
Transition of Care [facility]) - Initial/Assessment Note    Patient Details  Name: Stacy Brennan MRN: [id number] Date of Birth: 18-Nov-1945  Transition of Care [facility]) CM/SW Contact:    Lynnell Catalan, RN Phone Number: 09/08/2019, 11:43 AM  Clinical Narrative:                 This CM spoke with both pt daughters via phone for dc planning. It was confirmed with daughters that they want to continue home palliative services with [facility] at DC. Daughter [name] states that she wanted her mom to go to rehab at discharge. This CM went over PT recommendations with daughters and expressed that SNF is not the recommendation at this time. Daughters were then offered home health services (HHPT) and [name] states that she doesn't want a lot of people in and out of the home due to covid. TOC will continue to follow along and assist with DC planning.  Expected Discharge Plan: [place] Barriers to Discharge: Continued Medical Work up   Expected Discharge Plan and Services Expected Discharge Plan: [place]   Discharge Planning Services: CM Consult Post Acute Care Choice: [place] arrangements for the past 2 months: Apartment                       Prior Living Arrangements/Services Living arrangements for the past 2 months: Apartment Lives with:: Adult Children              Current home services: Other (comment)(Home palliative services through [facility].)    Activities of Daily Living Home Assistive Devices/Equipment: None ADL Screening (condition at time of admission) Patient's cognitive ability adequate to safely complete daily activities?: No Is the patient deaf or have difficulty hearing?: No Does the patient have difficulty seeing, even when wearing glasses/contacts?: Yes Does the patient have difficulty concentrating, remembering, or making decisions?: Yes Patient able to express need for assistance with ADLs?: Yes Does the patient have  difficulty dressing or bathing?: Yes Independently performs ADLs?: No Communication: Independent Dressing (OT): Needs assistance Is this a change from baseline?: Pre-admission baseline Grooming: Needs assistance Is this a change from baseline?: Pre-admission baseline Feeding: Needs assistance Is this a change from baseline?: Pre-admission baseline Bathing: Needs assistance Is this a change from baseline?: Pre-admission baseline Toileting: Needs assistance Is this a change from baseline?: Pre-admission baseline In/Out Bed: Needs assistance Is this a change from baseline?: Pre-admission baseline Walks in Home: Needs assistance Is this a change from baseline?: Pre-admission baseline Does the patient have difficulty walking or climbing stairs?: Yes Weakness of Legs: Both Weakness of Arms/Hands: Both     Admission diagnosis:  Dehydration [E86.0] Failure to thrive in adult [R62.7] AKI (acute kidney injury) ([place]) [N17.9] Patient Active Problem List   Diagnosis Date Noted  . Chronic anticoagulation   . Duodenal ulcer   . Gastric ulcer without hemorrhage or perforation   . Esophageal stricture   . GI bleed 11/19/2018  . Melena 11/19/2018  . Syncope, non cardiac 11/19/2018  . AKI (acute kidney injury) ([place]) 11/19/2018  . CKD (chronic kidney disease) stage 3, GFR 30-59 ml/min 11/19/2018  . Acute blood loss anemia   . Left subclavian artery occlusion 08/30/2018  . Osteoporosis 01/21/2015  . Encounter to establish care 12/21/2014  . Sinusitis, chronic 12/21/2014  . Fatigue 09/16/2014  . Cough 09/16/2014  . Benzodiazepine dependence ([place]) 05/02/2014  . Cataract 03/22/2014  . Cognitive impairment 03/22/2014  .  Snoring 03/22/2014  . Excessive daytime sleepiness 03/22/2014  . Insomnia 03/22/2014  . Anxiety state 01/01/2014  . Hyperlipidemia 01/01/2014  . Renal insufficiency 01/01/2014  . Preventative health care 01/01/2014  . Allergic rhinitis, cause unspecified 01/01/2014  .  Asthma 01/01/2014  . Depression 01/01/2014  . Stroke ([place])   . Hypertension   . Glaucoma    PCP:  Dorothyann Peng, NP Pharmacy:   [id number] [place], [place] [address] [place] [address] Phone: [phone] Fax: [place] [address], [place] [address] Phone: [phone] Fax: [phone]     Social Determinants of Health (SDOH) Interventions    Readmission Risk Interventions Readmission Risk Prevention Plan 09/08/2019  Transportation Screening Complete  PCP or Specialist Appt within 3-5 Days Complete  HRI or [place] Complete  Social Work Consult for [place] Planning/Counseling Complete  Palliative Care Screening Not Applicable  Medication Review [employer]) Complete  Some recent data might be hidden

## 2019-09-08 NOTE — Progress Notes (Signed)
 PROGRESS NOTE  Stacy Brennan MRN:[id number] DOB: 12/27/1945 DOA: 09/04/2019 PCP: Nafziger, [name], NP  HPI/Recap of past 24 hours: Stacy Brennan is a 74 y.o. female with medical history significant for dementia, hypertension, CKD, CVA, depression, glaucoma, cataracts, was brought to the ED by her daughter complaining of poor oral intake for the past 2-3 weeks, only able to drink Ensure and small amounts of water/juice intermittently.  Not taking any medication for the past 3 days. Patient also has not had any bowel movements in the past couple of days.  Patient has significant history of dementia with visual hallucinations and insomnia.  Patient is under the care of palliative as an outpatient.  EDP had an extensive discussion with daughter, specifically stated no SNF admission, wants patient back home once hydrated and stabilized. In the ED, VSS except for uncontrolled BP, labs showed creatinine of 3.84 above baseline around 1.2, BUN 116, hemoglobin 12.6 which is higher than her baseline around 10.  COVID-19 test pending, UA/UC/chest x-ray currently are pending.  CT abdomen/pelvis done was unremarkable.  EDP disimpacted patient as well as suppositories with good results.  Patient admitted for IV hydration and stabilization.    Today, met patient resting comfortably in bed.  Looked comfortable.  Denies any new complaints    Assessment/Plan: Principal Problem:   AKI (acute kidney injury) (HCC) Active Problems:   Stroke (HCC)   Hypertension   Glaucoma   Anxiety state   Hyperlipidemia   Renal insufficiency   Depression   Poor oral intake/failure to thrive Likely related to ??progressive dementia Poor oral intake for the past 2 to 3 weeks CT abdomen/pelvis unremarkable Ruled out any source of infection, UA negative, UC showed multiple species Chest x-ray unremarkable, COVID-19 negative IV fluids Dietitian consulted  AKI on CKD stage III with metabolic acidosis Likely 2/2  above Metabolic acidosis improved Creatinine on admission 3.84, baseline around 1.23 Continue IV fluids, stopped IV fluids with bicarb CT abdomen/pelvis showed no renal abnormalities Daily BMP  ??Orthostatic hypotension Repeat orthostatic vitals Q shift Continue IVF PT/OT  Hypokalemia Replace as needed  Acute urinary retention Insert Foley Strict I's and O's Plan for voiding trial  Constipation EDP disimpacted patient with good results in the ED Bowel regimen  Hypertension hydralazine as needed, labetalol as needed Hold home Aldactone, verapamil  History of CVA Continue home aspirin, Lipitor  History of glaucoma/cataracts/legally blind Continue multiple eyedrops  Dementia with behavioral disturbance History of visual hallucination, insomnia, anxiety Continue quetiapine, Ativan as needed Pt will require safety sitter  GOC Discussed extensively with daughter is on 09/04/2019, wants patient to be full code Currently being followed by outpatient palliative care       Malnutrition Type:  Nutrition Problem: Inadequate oral intake Etiology: lethargy/confusion   Malnutrition Characteristics:  Signs/Symptoms: per patient/family report   Nutrition Interventions:  Interventions: Ensure Enlive (each supplement provides 350kcal and 20 grams of protein), MVI    Estimated body mass index is 22.79 kg/m as calculated from the following:   Height as of this encounter: 5' 7" (1.702 m).   Weight as of this encounter: 66 kg.     Code Status: Full  Family Communication: Discussed with daughter is on 09/06/2019  Disposition Plan: Likely home, pending renal function   Consultants:  None  Procedures:  None  Antimicrobials:  None  DVT prophylaxis: Heparin [place]   Objective: Vitals:   09/08/19 0500 09/08/19 0557 09/08/19 1033 09/08/19 1333  BP:  (!) 147/75 132/73 (!) 146/82    Pulse:  (!) 108 96 97  Resp:  _0 Temp:  98.6 F (37 C) 98.4 F  (36.9 C) 98.3 F (36.8 C)  TempSrc:  Oral Oral Oral  SpO2:  100% 100% 100%  Weight:      Height: 5' 7" (1.702 m)       Intake/Output Summary (Last 24 hours) at 09/08/2019 1639 Last data filed at 09/08/2019 1600 Gross per 24 hour  Intake 2537.29 ml  Output 950 ml  Net 1587.29 ml   Filed Weights   09/06/19 0700  Weight: 66 kg    Exam:  General: NAD   Cardiovascular: S1, S2 present  Respiratory: CTAB  Abdomen: Soft, nontender, nondistended, bowel sounds present  Musculoskeletal: No bilateral pedal edema noted  Skin: Normal  Psychiatry:  Unable to assess    Data Reviewed: CBC: Recent Labs  Lab 09/04/19 1440 09/05/19 0433 09/06/19 0533 09/07/19 0517 09/08/19 0451  WBC 7.3 9.4 8.5 7.4 8.4  NEUTROABS 5.4  --  5.3 4.7 5.6  HGB 12.6 10.8* 10.4* 9.5* 9.5*  HCT 39.6 34.1* 30.4* 29.3* 29.2*  MCV 92.5 95.0 89.7 91.6 92.1  PLT 241 185 160 148* 154*   Basic Metabolic Panel: Recent Labs  Lab 09/04/19 1440 09/05/19 0433 09/06/19 0533 09/07/19 0517 09/08/19 0451  NA 145 145 146* 141 137  K 4.8 4.2 2.8* 3.3* 3.3*  CL 115* 123* 108 111 109  CO2 17* 9* 23 22 19*  GLUCOSE 116* 82 113* 113* 108*  BUN 78* 59* 43* 31* 21  CREATININE 3.84* 3.11* 2.49* 2.21* 1.92*  CALCIUM 10.3 9.4 8.5* 8.2* 7.9*   GFR: Estimated Creatinine Clearance: 25 mL/min (A) (by C-G formula based on SCr of 1.92 mg/dL (H)). Liver Function Tests: Recent Labs  Lab 09/04/19 1440 09/05/19 0433  AST 22 29  ALT 27 22  ALKPHOS 116 94  BILITOT 1.0 0.8  PROT 9.1* 7.2  ALBUMIN 4.7 3.8   Recent Labs  Lab 09/04/19 1440  LIPASE 59*   No results for input(s): AMMONIA in the last 168 hours. Coagulation Profile: No results for input(s): INR, PROTIME in the last 168 hours. Cardiac Enzymes: No results for input(s): CKTOTAL, CKMB, CKMBINDEX, TROPONINI in the last 168 hours. BNP (last 3 results) No results for input(s): PROBNP in the last 8760 hours. HbA1C: No results for input(s): HGBA1C in the  last 72 hours. CBG: No results for input(s): GLUCAP in the last 168 hours. Lipid Profile: No results for input(s): CHOL, HDL, LDLCALC, TRIG, CHOLHDL, LDLDIRECT in the last 72 hours. Thyroid Function Tests: No results for input(s): TSH, T4TOTAL, FREET4, T3FREE, THYROIDAB in the last 72 hours. Anemia Panel: No results for input(s): VITAMINB12, FOLATE, FERRITIN, TIBC, IRON, RETICCTPCT in the last 72 hours. Urine analysis:    Component Value Date/Time   COLORURINE YELLOW 09/05/2019 1002   APPEARANCEUR CLEAR 09/05/2019 1002   LABSPEC 1.016 09/05/2019 1002   PHURINE 6.0 09/05/2019 1002   GLUCOSEU NEGATIVE 09/05/2019 1002   HGBUR NEGATIVE 09/05/2019 1002   [place] 09/05/2019 1002   BILIRUBINUR Negative 07/21/2019 1554   KETONESUR 20 (A) 09/05/2019 1002   PROTEINUR 30 (A) 09/05/2019 1002   UROBILINOGEN 0.2 07/21/2019 1554   NITRITE NEGATIVE 09/05/2019 1002   LEUKOCYTESUR NEGATIVE 09/05/2019 1002   Sepsis Labs: _1 (procalcitonin:4,lacticidven:4)  ) Recent Results (from the past 240 hour(s))  SARS CORONAVIRUS 2 (TAT 6-24 HRS) Nasopharyngeal Nasopharyngeal Swab     Status: None   Collection Time: 09/04/19  4:54 PM  Specimen: Nasopharyngeal Swab  Result Value Ref Range Status   SARS Coronavirus 2 NEGATIVE NEGATIVE Final    Comment: (NOTE) SARS-CoV-2 target nucleic acids are NOT DETECTED. The SARS-CoV-2 RNA is generally detectable in upper and lower respiratory specimens during the acute phase of infection. Negative results do not preclude SARS-CoV-2 infection, do not rule out co-infections with other pathogens, and should not be used as the sole basis for treatment or other patient management decisions. Negative results must be combined with clinical observations, patient history, and epidemiological information. The expected result is Negative. Fact Sheet for Patients: SugarRoll.be Fact Sheet for Healthcare  Providers: https://www.woods-mathews.com/ This test is not yet approved or cleared by the [place] FDA and  has been authorized for detection and/or diagnosis of SARS-CoV-2 by FDA under an Emergency Use Authorization (EUA). This EUA will remain  in effect (meaning this test can be used) for the duration of the COVID-19 declaration under Section 56 4(b)(1) of the Act, 21 U.S.C. section 360bbb-3(b)(1), unless the authorization is terminated or revoked sooner. Performed at [facility], [place] [address] [place], [place] [address]   Urine culture     Status: Abnormal   Collection Time: 09/05/19 10:02 AM   Specimen: Urine, Clean Catch  Result Value Ref Range Status   Specimen Description   Final    URINE, CLEAN CATCH Performed at [facility] [place] [address] [place], [place] [address]    Special Requests   Final    NONE Performed at [facility], [place] [address] [place], [place] [address]    Culture MULTIPLE SPECIES PRESENT, SUGGEST RECOLLECTION (A)  Final   Report Status 09/06/2019 FINAL  Final      Studies: No results found.  Scheduled Meds: . aspirin EC  81 mg Oral Daily  . atorvastatin  20 mg Oral Daily  . brimonidine  1 drop Both Eyes BID  . dorzolamide-timolol  1 drop Both Eyes BID  . feeding supplement (ENSURE ENLIVE)  237 mL Oral BID BM  . heparin  5,000 Units Subcutaneous Q8H  . multivitamin with minerals  1 tablet Oral Daily  . Netarsudil Dimesylate  1 drop Both Eyes Daily  . polyethylene glycol  17 g Oral Daily  . QUEtiapine  50 mg Oral BID   And  . QUEtiapine  300 mg Oral QHS  . senna-docusate  1 tablet Oral BID    Continuous Infusions: . dextrose 5 % and 0.45% NaCl 75 mL/hr at 09/08/19 1600     LOS: 3 days     Alma Friendly, MD [facility]  If 7PM-7AM, please contact night-coverage [email or web address] 09/08/2019, 4:39 PM

## 2019-09-09 ENCOUNTER — Telehealth: Payer: Self-pay | Admitting: Internal Medicine

## 2019-09-09 ENCOUNTER — Inpatient Hospital Stay (HOSPITAL_COMMUNITY): Payer: Medicare Other

## 2019-09-09 ENCOUNTER — Other Ambulatory Visit: Payer: Medicare Other | Admitting: Internal Medicine

## 2019-09-09 DIAGNOSIS — R413 Other amnesia: Secondary | ICD-10-CM

## 2019-09-09 DIAGNOSIS — F0151 Vascular dementia with behavioral disturbance: Secondary | ICD-10-CM

## 2019-09-09 DIAGNOSIS — Z515 Encounter for palliative care: Secondary | ICD-10-CM

## 2019-09-09 DIAGNOSIS — F01518 Vascular dementia, unspecified severity, with other behavioral disturbance: Secondary | ICD-10-CM

## 2019-09-09 LAB — BASIC METABOLIC PANEL
Anion gap: 9 (ref 5–15)
BUN: 18 mg/dL (ref 8–23)
CO2: 15 mmol/L — ABNORMAL LOW (ref 22–32)
Calcium: 7.9 mg/dL — ABNORMAL LOW (ref 8.9–10.3)
Chloride: 116 mmol/L — ABNORMAL HIGH (ref 98–111)
Creatinine, Ser: 1.93 mg/dL — ABNORMAL HIGH (ref 0.44–1.00)
GFR calc Af Amer: 29 mL/min — ABNORMAL LOW (ref >60)
GFR calc non Af Amer: 25 mL/min — ABNORMAL LOW (ref >60)
Glucose, Bld: 84 mg/dL (ref 70–99)
Potassium: 3.8 mmol/L (ref 3.5–5.1)
Sodium: 140 mmol/L (ref 135–145)

## 2019-09-09 LAB — URINALYSIS, ROUTINE W REFLEX MICROSCOPIC
Bilirubin Urine: NEGATIVE
Glucose, UA: NEGATIVE mg/dL
Ketones, ur: NEGATIVE mg/dL
Nitrite: NEGATIVE
Protein, ur: 30 mg/dL — AB
Specific Gravity, Urine: 1.013 (ref 1.005–1.030)
WBC, UA: >50 WBC/hpf — ABNORMAL HIGH (ref 0–5)
pH: 7 (ref 5.0–8.0)

## 2019-09-09 LAB — CBC WITH DIFFERENTIAL/PLATELET
Abs Immature Granulocytes: 0.08 10*3/uL — ABNORMAL HIGH (ref 0.00–0.07)
Basophils Absolute: 0 10*3/uL (ref 0.0–0.1)
Basophils Relative: 0 %
Eosinophils Absolute: 0.1 10*3/uL (ref 0.0–0.5)
Eosinophils Relative: 1 %
HCT: 26.6 % — ABNORMAL LOW (ref 36.0–46.0)
Hemoglobin: 9 g/dL — ABNORMAL LOW (ref 12.0–15.0)
Immature Granulocytes: 1 %
Lymphocytes Relative: 19 %
Lymphs Abs: 1.5 10*3/uL (ref 0.7–4.0)
MCH: 29.9 pg (ref 26.0–34.0)
MCHC: 33.8 g/dL (ref 30.0–36.0)
MCV: 88.4 fL (ref 80.0–100.0)
Monocytes Absolute: 0.8 10*3/uL (ref 0.1–1.0)
Monocytes Relative: 10 %
Neutro Abs: 5.4 10*3/uL (ref 1.7–7.7)
Neutrophils Relative %: 69 %
Platelets: 120 10*3/uL — ABNORMAL LOW (ref 150–400)
RBC: 3.01 MIL/uL — ABNORMAL LOW (ref 3.87–5.11)
RDW: 13.6 % (ref 11.5–15.5)
WBC: 8 10*3/uL (ref 4.0–10.5)
nRBC: 0 % (ref 0.0–0.2)

## 2019-09-09 MED ORDER — SODIUM CHLORIDE 0.9 % IV SOLN
1.0000 g | INTRAVENOUS | Status: DC
  Filled 2019-09-09: qty 10

## 2019-09-09 MED ORDER — HALOPERIDOL LACTATE 5 MG/ML IJ SOLN: 2.0000 mg | Freq: Once | INTRAMUSCULAR | Status: DC

## 2019-09-09 MED ORDER — SODIUM CHLORIDE 0.9 % IV SOLN
1.0000 g | INTRAVENOUS | Status: DC
  Administered 2019-09-09 – 2019-09-11 (×3): 1 g via INTRAVENOUS
  Filled 2019-09-09: qty 10
  Filled 2019-09-09: qty 1
  Filled 2019-09-09: qty 10

## 2019-09-09 MED ORDER — TRAVOPROST (BAK FREE) 0.004 % OP SOLN
1.0000 [drp] | Freq: Every day | OPHTHALMIC | Status: DC
  Administered 2019-09-09 – 2019-09-13 (×4): 1 [drp] via OPHTHALMIC

## 2019-09-09 MED ORDER — MORPHINE SULFATE (PF) 2 MG/ML IV SOLN: 1.0000 mg | Freq: Once | INTRAVENOUS | Status: DC

## 2019-09-09 NOTE — Care Management Important Message (Signed)
Important Message  Patient Details IM Letter given to Marney Doctor RN Case Manager to present to the Patient Name: Stacy Brennan MRN: [id number] Date of Birth: [birth year]   Medicare Important Message Given:  Yes     Stacy Brennan 09/09/2019, 11:21 AM

## 2019-09-09 NOTE — Progress Notes (Signed)
  Speech Language Pathology Treatment: Dysphagia  Patient Details Name: Stacy Brennan MRN: [id number] DOB: 01/08/1946 Today's Date: 09/09/2019 Time: 19-1830 SLP Time Calculation (min) (ACUTE ONLY): 40 min  Assessment / Plan / Recommendation Clinical Impression  Upon SLP arrival, RN was in room with pt and daughter.  Daughter was providing pt last spoonful of icecream, after which pt stated she felt sick and held her stomach.  SlP reviewed clinical reasoning for dietary changes and swallowing compensation strategies.  Daughter states her mother has had progressive decline in po and oral holding over the last few months, which resulted in her admission.  She advised that she gives her mom a cup to expectorate into at home when she is holding food/drink her mouth. Daughter expressed gratitude for pt's diet change to full liquids to improve efficiency and hopefully intake.  SLP then initiated discussion with daughter re: pt's dysphagia and plans if pt continues with progressive decline in po causing malnutrition and dehydration.     Daughter appeared uncomfortable with this discussion and SLP then invited her to leave the pt's room.  She expressed gratitude stating she wasn't comfortable talking in front of her mother.  Educated daughter to progression of dysphagia with dementia and that feeding tubes with pt's with advanced dementia have a high burden without proven benefit.  Encouraged her to conduct her own fact finding/research re: feeding tubes.  Daughter is overwhelmed with pt's care requirements and is concerned re: her mother's current state with poor nutrition. Encouraged her to speak to palliative care team and MD re: care plan, pt/daughter wishes. Reinforced the daughter's love for her mother is evidenced in her communication and care she has provided to her as Stacy Brennan states she feels guilt.  Written information re: dysphagia and dementia and "comfort feeding" reviewed.    No SLP follow up  indicated as all education completed, Thanks for allowing me to help with this most kind family.    HPI HPI: 74 yo female adm to [facility] with AKI.  Pt PMH + for dementia, cataracts/glaucoma *legally blind.  Had poor intake and fatigue.  Swallow eval ordered.  OT reports pt holding food in her mouth during session and she did not swallow her medications with RN.      SLP Plan  All goals met       Recommendations  Diet recommendations: Thin liquid(continue full liquids) Medication Administration: (try with icecream) Supervision: Patient able to self feed(encourage pt to feed herself) Compensations: Slow rate;Small sips/bites;Other (Comment) Postural Changes and/or Swallow Maneuvers: Seated upright 90 degrees;Upright 30-60 min after meal                Oral Care Recommendations: Other (Comment)(before and after) SLP Visit Diagnosis: Dysphagia, oral phase (R13.11) Plan: All goals met       GO          Kathleen Lime, MS [facility] SLP Acute Rehab Services Office [phone]       Macario Golds 09/09/2019, 7:58 PM

## 2019-09-09 NOTE — Progress Notes (Addendum)
PROGRESS NOTE  Stacy Brennan ZPH:[id number] DOB: [birth year] DOA: 09/04/2019 PCP: Dorothyann Peng, NP  HPI/Recap of past 24 hours: Stacy Brennan is a 74 y.o. female with medical history significant for dementia, hypertension, CKD, CVA, depression, glaucoma, cataracts, was brought to the ED by her daughter complaining of poor oral intake for the past 2-3 weeks, only able to drink Ensure and small amounts of water/juice intermittently.  Not taking any medication for the past 3 days. Patient also has not had any bowel movements in the past couple of days.  Patient has significant history of dementia with visual hallucinations and insomnia.  Patient is under the care of palliative as an outpatient.  EDP had an extensive discussion with daughter, specifically stated no SNF admission, wants patient back home once hydrated and stabilized. In the ED, VSS except for uncontrolled BP, labs showed creatinine of 3.84 above baseline around 1.2, BUN 116, hemoglobin 12.6 which is higher than her baseline around 10.  COVID-19 test pending, UA/UC/chest x-ray currently are pending.  CT abdomen/pelvis done was unremarkable.  EDP disimpacted patient as well as suppositories with good results.  Patient admitted for IV hydration and stabilization.     Today met patient in bed, sleeping, easily arousable.  Still with generalized weakness, poor appetite.  Denies any new complaints.    Assessment/Plan: Principal Problem:   AKI (acute kidney injury) ([place]) Active Problems:   Stroke ([place])   Hypertension   Glaucoma   Anxiety state   Hyperlipidemia   Renal insufficiency   Depression   Poor oral intake/failure to thrive Likely related to ??progressive dementia Poor oral intake for the past 2 to 3 weeks CT abdomen/pelvis unremarkable Ruled out any source of infection, UA negative, UC showed multiple species Repeat chest x-ray unremarkable, COVID-19 negative Repeat UA positive for infection, UC pending BC x2  pending IV fluids Start IV ceftriaxone Dietitian consulted  AKI on CKD stage III with metabolic acidosis Likely 2/2 above Creatinine on admission 3.84, baseline around 1.23 Continue IV fluids, stopped IV fluids with bicarb as bicarb was around 23, currently trending down CT abdomen/pelvis showed no renal abnormalities Ultrasound renal, unremarkable Daily BMP  ??Orthostatic hypotension Repeat orthostatic vitals Q shift Continue IVF PT/OT  Hypokalemia Replace as needed  Acute urinary retention Insert Foley Strict I's and O's Plan for voiding trial  Constipation EDP disimpacted patient with good results in the ED Bowel regimen  Hypertension hydralazine as needed, labetalol as needed Hold home Aldactone, verapamil  History of CVA Continue home aspirin, Lipitor  History of glaucoma/cataracts/legally blind Continue multiple eyedrops  Dementia with behavioral disturbance History of visual hallucination, insomnia, anxiety Continue quetiapine, Ativan as needed Pt will require safety sitter  [place] Very poor prognosis overall.  Discussed extensively with daughter is on 09/04/2019, wants patient to be full code Currently being followed by outpatient palliative care Inpatient palliative consulted       Malnutrition Type:  Nutrition Problem: Inadequate oral intake Etiology: lethargy/confusion   Malnutrition Characteristics:  Signs/Symptoms: per patient/family report   Nutrition Interventions:  Interventions: Ensure Enlive (each supplement provides 350kcal and 20 grams of protein), MVI    Estimated body mass index is 22.79 kg/m as calculated from the following:   Height as of this encounter: _0  (1.702 m).   Weight as of this encounter: 66 kg.     Code Status: Full  Family Communication: Discussed with daughter is on 09/09/2019.  Discussed in details about patient's overall poor prognosis.  Daughter is  leaning towards hospice care, but wants to speak  to her other family members before making the decision.  Will call back on 09/10/2019.  Still full code as of today, pending discussion with family  Disposition Plan: Patient will benefit from SNF versus hospice, depending on what family has decided.    Consultants:  None  Procedures:  None  Antimicrobials:  None  DVT prophylaxis: Heparin [place]   Objective: Vitals:   09/08/19 1333 09/08/19 2102 09/09/19 0614 09/09/19 1322  BP: (!) 146/82 (!) 152/87 132/81 104/64  Pulse: 97 (!) 107 (!) 110 (!) 109  Resp: _0 Temp: 98.3 F (36.8 C) 98.6 F (37 C) 98.5 F (36.9 C) (!) 97.3 F (36.3 C)  TempSrc: Oral Oral Oral Oral  SpO2: 100% 95% 94% 100%  Weight:      Height:        Intake/Output Summary (Last 24 hours) at 09/09/2019 1518 Last data filed at 09/09/2019 1315 Gross per 24 hour  Intake 1698.46 ml  Output 1625 ml  Net 73.46 ml   Filed Weights   09/06/19 0700  Weight: 66 kg    Exam:  General: NAD, chronically ill-appearing, deconditioned  Cardiovascular: S1, S2 present  Respiratory: CTAB  Abdomen: Soft, nontender, nondistended, bowel sounds present  Musculoskeletal: No bilateral pedal edema noted  Skin: Normal  Psychiatry:  Unable to assess    Data Reviewed: CBC: Recent Labs  Lab 09/04/19 1440 09/04/19 1440 09/05/19 0433 09/06/19 0533 09/07/19 0517 09/08/19 0451 09/09/19 0528  WBC 7.3   < > 9.4 8.5 7.4 8.4 8.0  NEUTROABS 5.4  --   --  5.3 4.7 5.6 5.4  HGB 12.6   < > 10.8* 10.4* 9.5* 9.5* 9.0*  HCT 39.6   < > 34.1* 30.4* 29.3* 29.2* 26.6*  MCV 92.5   < > 95.0 89.7 91.6 92.1 88.4  PLT 241   < > 185 160 148* 133* 120*   < > = values in this interval not displayed.   Basic Metabolic Panel: Recent Labs  Lab 09/05/19 0433 09/06/19 0533 09/07/19 0517 09/08/19 0451 09/09/19 0528  NA 145 146* 141 137 140  K 4.2 2.8* 3.3* 3.3* 3.8  CL 123* 108 111 109 116*  CO2 9* 23 22 19* 15*  GLUCOSE 82 113* 113* 108* 84  BUN 59* 43* 31* 21 18    CREATININE 3.11* 2.49* 2.21* 1.92* 1.93*  CALCIUM 9.4 8.5* 8.2* 7.9* 7.9*   GFR: Estimated Creatinine Clearance: 24.9 mL/min (A) (by C-G formula based on SCr of 1.93 mg/dL (H)). Liver Function Tests: Recent Labs  Lab 09/04/19 1440 09/05/19 0433  AST 22 29  ALT 27 22  ALKPHOS 116 94  BILITOT 1.0 0.8  PROT 9.1* 7.2  ALBUMIN 4.7 3.8   Recent Labs  Lab 09/04/19 1440  LIPASE 59*   No results for input(s): AMMONIA in the last 168 hours. Coagulation Profile: No results for input(s): INR, PROTIME in the last 168 hours. Cardiac Enzymes: No results for input(s): CKTOTAL, CKMB, CKMBINDEX, TROPONINI in the last 168 hours. BNP (last 3 results) No results for input(s): PROBNP in the last 8760 hours. HbA1C: No results for input(s): HGBA1C in the last 72 hours. CBG: No results for input(s): GLUCAP in the last 168 hours. Lipid Profile: No results for input(s): CHOL, HDL, LDLCALC, TRIG, CHOLHDL, LDLDIRECT in the last 72 hours. Thyroid Function Tests: No results for input(s): TSH, T4TOTAL, FREET4, T3FREE, THYROIDAB in the last 72 hours. Anemia Panel: No  results for input(s): VITAMINB12, FOLATE, FERRITIN, TIBC, IRON, RETICCTPCT in the last 72 hours. Urine analysis:    Component Value Date/Time   COLORURINE YELLOW 09/05/2019 1002   APPEARANCEUR CLEAR 09/05/2019 1002   LABSPEC 1.016 09/05/2019 1002   PHURINE 6.0 09/05/2019 1002   GLUCOSEU NEGATIVE 09/05/2019 1002   HGBUR NEGATIVE 09/05/2019 1002   [place] 09/05/2019 1002   BILIRUBINUR Negative 07/21/2019 1554   KETONESUR 20 (A) 09/05/2019 1002   PROTEINUR 30 (A) 09/05/2019 1002   UROBILINOGEN 0.2 07/21/2019 1554   NITRITE NEGATIVE 09/05/2019 1002   LEUKOCYTESUR NEGATIVE 09/05/2019 1002   Sepsis Labs: _0 (procalcitonin:4,lacticidven:4)  ) Recent Results (from the past 240 hour(s))  SARS CORONAVIRUS 2 (TAT 6-24 HRS) Nasopharyngeal Nasopharyngeal Swab     Status: None   Collection Time: 09/04/19  4:54 PM    Specimen: Nasopharyngeal Swab  Result Value Ref Range Status   SARS Coronavirus 2 NEGATIVE NEGATIVE Final    Comment: (NOTE) SARS-CoV-2 target nucleic acids are NOT DETECTED. The SARS-CoV-2 RNA is generally detectable in upper and lower respiratory specimens during the acute phase of infection. Negative results do not preclude SARS-CoV-2 infection, do not rule out co-infections with other pathogens, and should not be used as the sole basis for treatment or other patient management decisions. Negative results must be combined with clinical observations, patient history, and epidemiological information. The expected result is Negative. Fact Sheet for Patients: SugarRoll.be Fact Sheet for Healthcare Providers: https://www.woods-mathews.com/ This test is not yet approved or cleared by the [place] FDA and  has been authorized for detection and/or diagnosis of SARS-CoV-2 by FDA under an Emergency Use Authorization (EUA). This EUA will remain  in effect (meaning this test can be used) for the duration of the COVID-19 declaration under Section 56 4(b)(1) of the Act, 21 U.S.C. section 360bbb-3(b)(1), unless the authorization is terminated or revoked sooner. Performed at [facility], [place] [address], [place], [place] [address]   Urine culture     Status: Abnormal   Collection Time: 09/05/19 10:02 AM   Specimen: Urine, Clean Catch  Result Value Ref Range Status   Specimen Description   Final    URINE, CLEAN CATCH Performed at [facility], [place] [address] [place], [place] [address]    Special Requests   Final    NONE Performed at [facility] [place] [address] [place], [place] [address]    Culture MULTIPLE SPECIES PRESENT, SUGGEST RECOLLECTION (A)  Final   Report Status 09/06/2019 FINAL  Final      Studies: US RENAL  Result Date: 09/09/2019 CLINICAL DATA:  Acute kidney injury. EXAM: RENAL /  URINARY TRACT ULTRASOUND COMPLETE COMPARISON:  None. FINDINGS: Right Kidney: Not visualized as the patient is combative and unable to position properly. Left Kidney: Renal measurements: 12.1 x 6.4 x 6.0 cm = volume: 243 mL. 3.9 cm simple cyst is seen in lower pole. Echogenicity within normal limits. No mass or hydronephrosis visualized. Bladder: Unable to visualize due to limitations described above. Other: None. IMPRESSION: Right kidney and urinary bladder not visualized as patient could not be positioned properly. 3.9 cm simple left renal cyst is noted. Left kidney is otherwise unremarkable. Electronically Signed   By: Marijo Conception M.D.   On: 09/09/2019 10:31    Scheduled Meds: . aspirin EC  81 mg Oral Daily  . atorvastatin  20 mg Oral Daily  . brimonidine  1 drop Both Eyes BID  . dorzolamide-timolol  1 drop  Both Eyes BID  . feeding supplement (ENSURE ENLIVE)  237 mL Oral BID BM  . heparin  5,000 Units Subcutaneous Q8H  . multivitamin with minerals  1 tablet Oral Daily  . Netarsudil Dimesylate  1 drop Both Eyes Daily  . polyethylene glycol  17 g Oral Daily  . QUEtiapine  50 mg Oral BID   And  . QUEtiapine  300 mg Oral QHS  . senna-docusate  1 tablet Oral BID    Continuous Infusions: . sodium chloride 75 mL/hr at 09/09/19 1415  . cefTRIAXone (ROCEPHIN)  IV       LOS: 4 days     Alma Friendly, MD [facility]  If 7PM-7AM, please contact night-coverage [email or web address] 09/09/2019, 3:18 PM

## 2019-09-09 NOTE — Telephone Encounter (Signed)
Returned phone call to daughter/HCPOA [name] who expressed concerned about her mother's current status and level of decline that she has had over last several weeks and since hospitalization. Concerned that her mother is not eating much and requires assistance in being fed due to her blindness.  She is seeking updates on her mother's overall status and expressed that she desires for her to be discharged to a "rehab facility" so that she can receive 24 hour nursing care and physical therapy. She states that she is physically unable to care for her mother in her home at this time due to the level of care that she feels that she now needs and because of her own health issues.   I encouraged her to speak to the case manager and Hospitalist that are assigned to Ms. Stacy Brennan to determine if overall improvement of her mother's health and return to her previous state is a realistic goal. We discussed transition to Hospice care with the understanding that patient would not receive PT.  She stated that she understood and still wants her to have the attempt at PT for improvement.  I let her know that I would contact the Hospital Liaison team and ask if the inpatient Palliative team could possibly speak with her as well.  She thanked me for the conversation and she will follow-up with attempting to have additional conversations as well. She was leaving to visit with her mother at the hospital.   Gonzella Lex, NP-C

## 2019-09-09 NOTE — Progress Notes (Addendum)
Physical Therapy Treatment Patient Details Name: Stacy Brennan MRN: [id number] DOB: May 07, 1946 Today's Date: 09/09/2019    History of Present Illness Stacy Brennan is a 74 y.o. female who presents to [facility] due to p.o. intake with fatigue. PMG significant of dementia, hypertension, CKD, CVA, depression, glaucoma, and cataracts.    PT Comments    Pt in bed with B mittens difficult to arouse.  Responds to name and eyes shut most of session.  Pt did repeat, "I'm cold".   Orthostatic BP's taken Supine        90/67 HR 103 EOB           85/45 HR 110 Standing     88/56 HR 124 General bed mobility comments: required + 2 assist to transfer to EOB.  Groggy, eyes closed, few verbal response.  General transfer comment: + 2 side by side hand held assist due to cognition and low vision.  Assisted from bed to [facility] as pt was incont stool upond standing and unaware. General Gait Details: + 2 side by side hand held assist from [facility] to recliner with incomplete turns and assist to control stand to sit.  Limited by weakness, cognition and low vision.  Pt will need 24/7 care at home (dreesing, toileting, feeding, meds, mobility.    Follow Up Recommendations  Family was hoping for Home health PT;Supervision/Assistance - 24 hour  More appropriate for SNF   Equipment Recommendations  None recommended by PT    Recommendations for Other Services       Precautions / Restrictions Precautions Precautions: Fall Precaution Comments: Pt is legally blind, Hx dementia Restrictions Weight Bearing Restrictions: No    Mobility  Bed Mobility Overal bed mobility: Needs Assistance Bed Mobility: Supine to Sit     Supine to sit: Mod assist;+2 for physical assistance     General bed mobility comments: required + 2 assist to transfer to EOB.  Groggy, eyes closed, few verbal responce  Transfers Overall transfer level: Needs assistance Equipment used: 2 person hand held assist Transfers: Sit to/from [employer] Sit to Stand: Mod assist;+2 physical assistance;+2 safety/equipment Stand pivot transfers: Mod assist;Max assist;+2 physical assistance;+2 safety/equipment       General transfer comment: + 2 side by side hand held assist due to cognition and low vision.  Assisted from bed to [facility] as pt was incont stool upond standing and unaware.  Ambulation/Gait Ambulation/Gait assistance: Min assist;+2 physical assistance;+2 safety/equipment Gait Distance (Feet): 3 Feet Assistive device: 2 person hand held assist Gait Pattern/deviations: Step-to pattern;Step-through pattern;Decreased step length - right;Decreased step length - left;Shuffle;Trunk flexed;Staggering left;Staggering right;Narrow base of support Gait velocity: decreased   General Gait Details: + 2 side by side hand held assist from [facility] to recliner with incomplete turns and assist to control stand to sit.  Limited by weakness, cognition and low vision.   Stairs             Wheelchair Mobility    Modified Rankin (Stroke Patients Only)       Balance                                            Cognition Arousal/Alertness: Awake/alert;Lethargic(50/50)  General Comments: difficult to arouse, responds to name and repeated "I'm cold"      Exercises      General Comments        Pertinent Vitals/Pain Pain Assessment: No/denies pain    Home Living                      Prior Function            PT Goals (current goals can now be found in the care plan section) Progress towards PT goals: Progressing toward goals    Frequency    Min 3X/week      PT Plan Current plan remains appropriate    Co-evaluation              AM-PAC PT "6 Clicks" Mobility   Outcome Measure  Help needed turning from your back to your side while in a flat bed without using bedrails?: A Lot Help needed moving from lying on your back to  sitting on the side of a flat bed without using bedrails?: A Lot Help needed moving to and from a bed to a chair (including a wheelchair)?: A Lot Help needed standing up from a chair using your arms (e.g., wheelchair or bedside chair)?: A Lot Help needed to walk in hospital room?: A Lot Help needed climbing 3-5 steps with a railing? : Total 6 Click Score: 11    End of Session Equipment Utilized During Treatment: Gait belt Activity Tolerance: Patient limited by fatigue Patient left: in chair;with call bell/phone within reach;with chair alarm set Nurse Communication: Mobility status PT Visit Diagnosis: Unsteadiness on feet (R26.81);Muscle weakness (generalized) (M62.81);Difficulty in walking, not elsewhere classified (R26.2)     Time: [id number] PT Time Calculation (min) (ACUTE ONLY): 25 min  Charges:  $Gait Training: 8-22 mins $Therapeutic Activity: 8-22 mins                     Rica Koyanagi  PTA Acute  Rehabilitation Services Pager      [phone] Office      [phone]

## 2019-09-09 NOTE — Progress Notes (Signed)
Paged on call provider concerning pt climbing out of bed/agitation. Ativan PRN Q8 given and not effective. one time dose of Haldol 2mg  and Morphine 2mg  ordered by Stark Klein, NP. Haldol and Morphine were not given due to decrease in pt agitation. Provider notified of one time dose not being given.

## 2019-09-10 LAB — CBC WITH DIFFERENTIAL/PLATELET
Abs Immature Granulocytes: 0.04 10*3/uL (ref 0.00–0.07)
Basophils Absolute: 0.1 10*3/uL (ref 0.0–0.1)
Basophils Relative: 1 %
Eosinophils Absolute: 0.3 10*3/uL (ref 0.0–0.5)
Eosinophils Relative: 3 %
HCT: 28 % — ABNORMAL LOW (ref 36.0–46.0)
Hemoglobin: 8.9 g/dL — ABNORMAL LOW (ref 12.0–15.0)
Immature Granulocytes: 1 %
Lymphocytes Relative: 19 %
Lymphs Abs: 1.4 10*3/uL (ref 0.7–4.0)
MCH: 30 pg (ref 26.0–34.0)
MCHC: 31.8 g/dL (ref 30.0–36.0)
MCV: 94.3 fL (ref 80.0–100.0)
Monocytes Absolute: 0.9 10*3/uL (ref 0.1–1.0)
Monocytes Relative: 11 %
Neutro Abs: 5 10*3/uL (ref 1.7–7.7)
Neutrophils Relative %: 65 %
Platelets: 133 10*3/uL — ABNORMAL LOW (ref 150–400)
RBC: 2.97 MIL/uL — ABNORMAL LOW (ref 3.87–5.11)
RDW: 13.9 % (ref 11.5–15.5)
WBC: 7.6 10*3/uL (ref 4.0–10.5)
nRBC: 0 % (ref 0.0–0.2)

## 2019-09-10 LAB — BASIC METABOLIC PANEL
Anion gap: 9 (ref 5–15)
BUN: 18 mg/dL (ref 8–23)
CO2: 17 mmol/L — ABNORMAL LOW (ref 22–32)
Calcium: 8.2 mg/dL — ABNORMAL LOW (ref 8.9–10.3)
Chloride: 115 mmol/L — ABNORMAL HIGH (ref 98–111)
Creatinine, Ser: 1.84 mg/dL — ABNORMAL HIGH (ref 0.44–1.00)
GFR calc Af Amer: 31 mL/min — ABNORMAL LOW (ref >60)
GFR calc non Af Amer: 27 mL/min — ABNORMAL LOW (ref >60)
Glucose, Bld: 79 mg/dL (ref 70–99)
Potassium: 3.6 mmol/L (ref 3.5–5.1)
Sodium: 141 mmol/L (ref 135–145)

## 2019-09-10 MED ORDER — LORAZEPAM 2 MG/ML IJ SOLN
0.5000 mg | Freq: Once | INTRAMUSCULAR | Status: AC
  Administered 2019-09-10: 0.5 mg via INTRAVENOUS
  Filled 2019-09-10: qty 1

## 2019-09-10 MED ORDER — LACTATED RINGERS IV SOLN: INTRAVENOUS | Status: AC

## 2019-09-10 MED ORDER — LORAZEPAM 2 MG/ML IJ SOLN: 0.5000 mg | Freq: Three times a day (TID) | INTRAMUSCULAR | Status: DC | PRN

## 2019-09-10 NOTE — Progress Notes (Addendum)
PROGRESS NOTE    Stacy Brennan  NOB:[id number] DOB: Jun 07, 1946 DOA: 09/04/2019 PCP: Stacy Peng, NP   Brief Narrative:  Stacy Brennan 74 y.o.femalewith medical history significant fordementia, hypertension, CKD, CVA, depression, glaucoma, cataracts, was brought to the ED by her daughter complaining of poor oral intake for the past 2-3weeks,only able to drink Ensure and small amounts of water/juice intermittently. Not taking any medication for the past 3 days. Patient also has not had any bowel movements in the past couple of days. Patient has significant history of dementia with visual hallucinations and insomnia.Patient is under the care of palliative as an outpatient. EDP had an extensive discussion with daughter, specifically stated no SNF admission,wants patient back home once hydrated and stabilized. In the ED, VSS except for uncontrolled BP,labs showed creatinine of 3.84 above baseline around 1.2, BUN 116, hemoglobin 12.6 which is higher than her baseline around 10.COVID-19 test pending,UA/UC/chest x-ray currently are pending.CT abdomen/pelvis done was unremarkable.EDP disimpacted patient as well as suppositories with good results. Patient admitted forIV hydration and stabilization.  Assessment & Plan:   Principal Problem:   AKI (acute kidney injury) ([place]) Active Problems:   Stroke ([place])   Hypertension   Glaucoma   Anxiety state   Hyperlipidemia   Renal insufficiency   Depression  Poor oral intake/failure to thrive Likely related to??progressive dementia Poor oral intake for the past 2 to 3 weeks CT abdomen/pelvis unremarkable Ruled out any source of infection, UA negative, UC showed multiple species Repeat chest x-ray unremarkable, COVID-19 negative Repeat UA positive for infection, UC pending BC x2 pending IV fluids Start IV ceftriaxone Dietitian consulted  AKIon CKD stage III with metabolic acidosis Likely 2/2 above Creatinine on  admission 3.84, baseline around 1.23 - improving Transition to LR, follow  CT abdomen/pelvis showed no renal abnormalities Ultrasound renal, unremarkable Daily BMP  Pyuria Follow repeat cx, continue abx   ??Orthostatic hypotension Repeat orthostatic vitals Q shift Continue IVF PT/OT  Hypokalemia Replace as needed  Acute urinary retention Insert Foley Strict I's and O's Plan for voiding trial  Constipation EDP disimpacted patient with good results in the ED Bowel regimen  Hypertension hydralazine as needed, labetalol as needed Hold home Aldactone, verapamil  History of CVA Continue home aspirin, Lipitor  History of glaucoma/cataracts/legally blind Continue multiple eyedrops  Dementia with behavioral disturbance History of visual hallucination,insomnia,anxiety Continue quetiapine,will hold ativan for now with lethargy (consider restarting prn) Pt will require safety sitter  Thrombocytopenia: continue to monitor  [place] Very poor prognosis overall.  Dr. Horris Brennan discussed with daughter on 2/9 and appears daughter is leaning towards hospice at this point.  Palliative care c/s  DVT prophylaxis: heparin Code Status: full Family Communication: none at bedside - daughter 2/10 Disposition Plan:  . Patient came from: home            . Anticipated d/c place: pending further goc discussions . Barriers to d/c OR conditions which need to be met to effect Stacy Brennan safe d/c: additional goc discussions  Consultants:   Palliative care  Procedures:  Echo 1/31 IMPRESSIONS    1. The left ventricle has hyperdynamic systolic function of >94%. The  cavity size is decreased. There is mild left ventricular wall thickness.  Echo evidence of indeterminate diastolic filling patterns.  2. Normal left atrial size.  3. Normal right atrial size.  4. The mitral valve thickened. There is mild thickening. Regurgitation is  not visualized by color flow Doppler.  5. Normal  tricuspid valve.  6.  Tricuspid regurgitation is mild.  7. The aortic valve tricuspid. There is moderate thickening and moderate  calcification of the aortic valve. Aortic valve regurgitation is mild by  color flow Doppler.  8. Pulmonic valve regurgitation is mild by color flow Doppler.  9. No atrial level shunt detected by color flow Doppler.   Antimicrobials:  Anti-infectives (From admission, onward)   Start     Dose/Rate Route Frequency Ordered Stop   09/09/19 1815  cefTRIAXone (ROCEPHIN) 1 g in sodium chloride 0.9 % 100 mL IVPB     1 g 200 mL/hr over 30 Minutes Intravenous Every 24 hours 09/09/19 1815     09/09/19 1600  cefTRIAXone (ROCEPHIN) 1 g in sodium chloride 0.9 % 100 mL IVPB  Status:  Discontinued     1 g 200 mL/hr over 30 Minutes Intravenous Every 24 hours 09/09/19 1515 09/09/19 1519     Subjective: Stacy Brennan&Ox~1-2, sleepy  Objective: Vitals:   09/09/19 0614 09/09/19 1322 09/10/19 0542 09/10/19 1454  BP: 132/81 104/64 139/86 136/64  Pulse: (!) 110 (!) 109 (!) 110 (!) 105  Resp: '17 18 17 20  '$ Temp: 98.5 F (36.9 C) (!) 97.3 F (36.3 C) 97.6 F (36.4 C) 98.5 F (36.9 C)  TempSrc: Oral Oral Oral Oral  SpO2: 94% 100% 100% 98%  Weight:      Height:        Intake/Output Summary (Last 24 hours) at 09/10/2019 1624 Last data filed at 09/10/2019 0543 Gross per 24 hour  Intake --  Output 625 ml  Net -625 ml   Filed Weights   09/06/19 0700  Weight: 66 kg    Examination:  General exam: Appears calm and comfortable  Respiratory system: Clear to auscultation. Respiratory effort normal. Cardiovascular system: S1 & S2 heard, RRR.  Gastrointestinal system: Abdomen is nondistended, soft and nontender. Central nervous system: Alert and oriented x 1-2. No focal neurological deficits.  Lethargic, but arouses to voice. Extremities: no LEE Skin: No rashes, lesions or ulcers Psychiatry: Judgement and insight appear normal. Mood & affect appropriate.     Data Reviewed: I  have personally reviewed following labs and imaging studies  CBC: Recent Labs  Lab 09/06/19 0533 09/07/19 0517 09/08/19 0451 09/09/19 0528 09/10/19 0442  WBC 8.5 7.4 8.4 8.0 7.6  NEUTROABS 5.3 4.7 5.6 5.4 5.0  HGB 10.4* 9.5* 9.5* 9.0* 8.9*  HCT 30.4* 29.3* 29.2* 26.6* 28.0*  MCV 89.7 91.6 92.1 88.4 94.3  PLT 160 148* 133* 120* 480*   Basic Metabolic Panel: Recent Labs  Lab 09/06/19 0533 09/07/19 0517 09/08/19 0451 09/09/19 0528 09/10/19 0442  NA 146* 141 137 140 141  K 2.8* 3.3* 3.3* 3.8 3.6  CL 108 111 109 116* 115*  CO2 23 22 19* 15* 17*  GLUCOSE 113* 113* 108* 84 79  BUN 43* 31* '21 18 18  '$ CREATININE 2.49* 2.21* 1.92* 1.93* 1.84*  CALCIUM 8.5* 8.2* 7.9* 7.9* 8.2*   GFR: Estimated Creatinine Clearance: 26.1 mL/min (Dareld Mcauliffe) (by C-G formula based on SCr of 1.84 mg/dL (H)). Liver Function Tests: Recent Labs  Lab 09/04/19 1440 09/05/19 0433  AST 22 29  ALT 27 22  ALKPHOS 116 94  BILITOT 1.0 0.8  PROT 9.1* 7.2  ALBUMIN 4.7 3.8   Recent Labs  Lab 09/04/19 1440  LIPASE 59*   No results for input(s): AMMONIA in the last 168 hours. Coagulation Profile: No results for input(s): INR, PROTIME in the last 168 hours. Cardiac Enzymes: No results for input(s): CKTOTAL, CKMB, CKMBINDEX,  TROPONINI in the last 168 hours. BNP (last 3 results) No results for input(s): PROBNP in the last 8760 hours. HbA1C: No results for input(s): HGBA1C in the last 72 hours. CBG: No results for input(s): GLUCAP in the last 168 hours. Lipid Profile: No results for input(s): CHOL, HDL, LDLCALC, TRIG, CHOLHDL, LDLDIRECT in the last 72 hours. Thyroid Function Tests: No results for input(s): TSH, T4TOTAL, FREET4, T3FREE, THYROIDAB in the last 72 hours. Anemia Panel: No results for input(s): VITAMINB12, FOLATE, FERRITIN, TIBC, IRON, RETICCTPCT in the last 72 hours. Sepsis Labs: No results for input(s): PROCALCITON, LATICACIDVEN in the last 168 hours.  Recent Results (from the past 240  hour(s))  SARS CORONAVIRUS 2 (TAT 6-24 HRS) Nasopharyngeal Nasopharyngeal Swab     Status: None   Collection Time: 09/04/19  4:54 PM   Specimen: Nasopharyngeal Swab  Result Value Ref Range Status   SARS Coronavirus 2 NEGATIVE NEGATIVE Final    Comment: (NOTE) SARS-CoV-2 target nucleic acids are NOT DETECTED. The SARS-CoV-2 RNA is generally detectable in upper and lower respiratory specimens during the acute phase of infection. Negative results do not preclude SARS-CoV-2 infection, do not rule out co-infections with other pathogens, and should not be used as the sole basis for treatment or other patient management decisions. Negative results must be combined with clinical observations, patient history, and epidemiological information. The expected result is Negative. Fact Sheet for Patients: SugarRoll.be Fact Sheet for Healthcare Providers: https://www.woods-mathews.com/ This test is not yet approved or cleared by the [place] FDA and  has been authorized for detection and/or diagnosis of SARS-CoV-2 by FDA under an Emergency Use Authorization (EUA). This EUA will remain  in effect (meaning this test can be used) for the duration of the COVID-19 declaration under Section 56 4(b)(1) of the Act, 21 U.S.C. section 360bbb-3(b)(1), unless the authorization is terminated or revoked sooner. Performed at [facility], [place] [address], [place], [place] [address]   Urine culture     Status: Abnormal   Collection Time: 09/05/19 10:02 AM   Specimen: Urine, Clean Catch  Result Value Ref Range Status   Specimen Description   Final    URINE, CLEAN CATCH Performed at [facility], [place] [address], [place], [place] [address]    Special Requests   Final    NONE Performed at [facility], [place] [address] [place], [place] [address]    Culture MULTIPLE SPECIES PRESENT, SUGGEST RECOLLECTION (Jazariah Teall)  Final   Report  Status 09/06/2019 FINAL  Final  Culture, blood (routine x 2)     Status: None (Preliminary result)   Collection Time: 09/09/19  9:12 AM   Specimen: BLOOD RIGHT HAND  Result Value Ref Range Status   Specimen Description   Final    BLOOD RIGHT HAND Performed at [facility] [address] [place], [place] [address]    Special Requests   Final    BOTTLES DRAWN AEROBIC ONLY Blood Culture results may not be optimal due to an inadequate volume of blood received in culture bottles Performed at [facility] [address], [place], [place] [address]    Culture   Final    NO GROWTH < 24 HOURS Performed at [facility] [address] [place], [place] [address]    Report Status PENDING  Incomplete  Culture, blood (routine x 2)     Status: None (Preliminary result)   Collection Time: 09/09/19  9:12 AM   Specimen:  BLOOD  Result Value Ref Range Status   Specimen Description   Final    BLOOD RIGHT ARM Performed at [facility] [address], [place], [place] [address]    Special Requests   Final    BOTTLES DRAWN AEROBIC AND ANAEROBIC Blood Culture adequate volume Performed at [facility] [address], [place], [place] [address]    Culture   Final    NO GROWTH < 24 HOURS Performed at [facility] [address], [place], [place] [address]    Report Status PENDING  Incomplete         Radiology Studies: US RENAL  Result Date: 09/09/2019 CLINICAL DATA:  Acute kidney injury. EXAM: RENAL / URINARY TRACT ULTRASOUND COMPLETE COMPARISON:  None. FINDINGS: Right Kidney: Not visualized as the patient is combative and unable to position properly. Left Kidney: Renal measurements: 12.1 x 6.4 x 6.0 cm = volume: 243 mL. 3.9 cm simple cyst is seen in lower pole. Echogenicity within normal limits. No mass or hydronephrosis visualized. Bladder: Unable to visualize due to limitations described above. Other:  None. IMPRESSION: Right kidney and urinary bladder not visualized as patient could not be positioned properly. 3.9 cm simple left renal cyst is noted. Left kidney is otherwise unremarkable. Electronically Signed   By: Marijo Conception M.D.   On: 09/09/2019 10:31   DG Chest Port 1 View  Result Date: 09/09/2019 CLINICAL DATA:  74 y.o. female c/o weakness. Medical history significant for dementia, hypertension, CKD, CVA, depression, glaucoma, cataracts, was brought to the ED by her daughter complaining of poor oral intake for the past 2-3 weeks EXAM: PORTABLE CHEST - 1 VIEW COMPARISON:  09/04/2019 FINDINGS: Lungs clear.  Heart size upper limits normal. Aortic Atherosclerosis (ICD10-170.0). No pleural effusion.  No pneumothorax. Visualized bones unremarkable. IMPRESSION: No acute cardiopulmonary disease. Electronically Signed   By: Lucrezia Europe M.D.   On: 09/09/2019 16:08        Scheduled Meds: . aspirin EC  81 mg Oral Daily  . atorvastatin  20 mg Oral Daily  . brimonidine  1 drop Both Eyes BID  . dorzolamide-timolol  1 drop Both Eyes BID  . feeding supplement (ENSURE ENLIVE)  237 mL Oral BID BM  . heparin  5,000 Units Subcutaneous Q8H  . multivitamin with minerals  1 tablet Oral Daily  . Netarsudil Dimesylate  1 drop Both Eyes Daily  . polyethylene glycol  17 g Oral Daily  . QUEtiapine  50 mg Oral BID   And  . QUEtiapine  300 mg Oral QHS  . senna-docusate  1 tablet Oral BID  . Travoprost (BAK Free)  1 drop Both Eyes QHS   Continuous Infusions: . sodium chloride 75 mL/hr at 09/10/19 0057  . cefTRIAXone (ROCEPHIN)  IV 1 g (09/09/19 2129)     LOS: 5 days    Time spent: over 30 min    Fayrene Helper, MD [facility]   To contact the attending provider between 7A-7P or the covering provider during after hours 7P-7A, please log into the web site [email or web address] and access using universal [place] password for that web site. If you do not have the password, please call the  hospital operator.  09/10/2019, 4:24 PM

## 2019-09-10 NOTE — Progress Notes (Signed)
    [place] Consult Note Telephone: [phone]  Fax: [phone]  PATIENT NAME: Stacy Brennan DOB: 05/29/1946 MRN: [id number]  PRIMARY CARE PROVIDER:   Dorothyann Peng, NP   NOTE:  Previously scheduled Palliative care appointment cancelled due to pt's current hospitalization.  We will follow-up with patient after discharge. Phone update with hospital liaison, Erling Conte, who is following patient.   Gonzella Lex, NP

## 2019-09-10 NOTE — Progress Notes (Signed)
Nutrition Follow-up  DOCUMENTATION CODES:   Not applicable  INTERVENTION:  Continue Ensure BID Continue Magic Cup with meals   NUTRITION DIAGNOSIS:   Inadequate oral intake related to lethargy/confusion as evidenced by per patient/family report.  Ongoing, addressing via nutrition supplements  GOAL:   Patient will meet greater than or equal to 90% of their needs  Progressing  MONITOR:   PO intake, Supplement acceptance, Labs, Weight trends, I & O's  REASON FOR ASSESSMENT:   Consult Poor PO, Assessment of nutrition requirement/status  ASSESSMENT:  RD working remotely.  74 y.o. female with medical history significant for dementia, hypertension, CKD, CVA, depression, glaucoma, cataracts, was brought to the ED by her daughter complaining of poor oral intake for the past 2-3 weeks, only able to drink Ensure and small amounts of water/juice intermittently.  Not taking any medication for the past 3 days.  Patient also has not had any bowel movements in the past couple of days.  Patient has significant history of dementia with visual hallucinations and insomnia.  Patient is under the care of palliative as an outpatient.  EDP had an extensive discussion with daughter, patient is currently DNR and daughters specifically stated no SNF admission, wants patient back home once hydrated and stabilized.  2/4 - Regular diet 2/8 - FL diet  Patient with ongoing poor oral intake, documented with 0-25% po x 4 recorded meals.   Per notes, very poor prognosis overall. Discussions with daughter on 2/9 appears that daughter is leaning towards hospice. Anticipated discharge place pending further goals of care discussions. Will  monitor for goc and continue to provide Ensure and Magic Cup supplements at this time.  Medications and labs reviewed  Diet Order:   Diet Order            Diet full liquid Room service appropriate? No; Fluid consistency: Thin  Diet effective now               EDUCATION NEEDS:   No education needs have been identified at this time  Skin:  Skin Assessment: Reviewed RN Assessment  Last BM:  2/9 type 5  Height:   Ht Readings from Last 1 Encounters:  09/08/19 5\' 7"  (1.702 m)    Weight:   Wt Readings from Last 1 Encounters:  09/06/19 66 kg    Ideal Body Weight:  61.3 kg  BMI:  Body mass index is 22.79 kg/m.  Estimated Nutritional Needs:   Kcal:  1550-1750  Protein:  70-80g  Fluid:  1.7L/day   Lajuan Lines, RD, LDN Clinical Nutrition [place] Telephone [phone] After Hours/Weekend Pager: [phone]

## 2019-09-10 NOTE — NC FL2 (Signed)
[place] LEVEL OF CARE SCREENING TOOL     IDENTIFICATION  Patient Name: Stacy Brennan Birthdate: 02/24/1946 Sex: female Admission Date (Current Location): 09/04/2019  [facility] and [place] Number:  Herbalist and Address:  [facility],  [place], [place]      Provider Number: [id number]  Attending Physician Name and Address:  Elodia Florence., *  Relative Name and Phone Number:  Roxan Hockey [phone]    Current Level of Care: Hospital Recommended Level of Care: [place] Prior Approval Number:    Date Approved/Denied:   PASRR Number: Pending  Discharge Plan: SNF    Current Diagnoses: Patient Active Problem List   Diagnosis Date Noted  . Chronic anticoagulation   . Duodenal ulcer   . Gastric ulcer without hemorrhage or perforation   . Esophageal stricture   . GI bleed 11/19/2018  . Melena 11/19/2018  . Syncope, non cardiac 11/19/2018  . AKI (acute kidney injury) ([place]) 11/19/2018  . CKD (chronic kidney disease) stage 3, GFR 30-59 ml/min 11/19/2018  . Acute blood loss anemia   . Left subclavian artery occlusion 08/30/2018  . Osteoporosis 01/21/2015  . Encounter to establish care 12/21/2014  . Sinusitis, chronic 12/21/2014  . Fatigue 09/16/2014  . Cough 09/16/2014  . Benzodiazepine dependence ([place]) 05/02/2014  . Cataract 03/22/2014  . Cognitive impairment 03/22/2014  . Snoring 03/22/2014  . Excessive daytime sleepiness 03/22/2014  . Insomnia 03/22/2014  . Anxiety state 01/01/2014  . Hyperlipidemia 01/01/2014  . Renal insufficiency 01/01/2014  . Preventative health care 01/01/2014  . Allergic rhinitis, cause unspecified 01/01/2014  . Asthma 01/01/2014  . Depression 01/01/2014  . Stroke ([place])   . Hypertension   . Glaucoma     Orientation RESPIRATION BLADDER Height & Weight     Self  Normal Incontinent Weight: 66 kg Height:  5\' 7"  (170.2 cm)  BEHAVIORAL SYMPTOMS/MOOD  NEUROLOGICAL BOWEL NUTRITION STATUS      Incontinent Diet  AMBULATORY STATUS COMMUNICATION OF NEEDS Skin   Extensive Assist Verbally Normal                       Personal Care Assistance Level of Assistance  Bathing, Dressing Bathing Assistance: Maximum assistance   Dressing Assistance: Maximum assistance     Functional Limitations Info             SPECIAL CARE FACTORS FREQUENCY  PT (By licensed PT), OT (By licensed OT)     PT Frequency: 5 x weekly OT Frequency: 5 x weekly            Contractures Contractures Info: Not present    Additional Factors Info  Code Status, Allergies Code Status Info: Full Allergies Info: Warfarin, Doxycycline, Aricept           Current Medications (09/10/2019):  This is the current hospital active medication list Current Facility-Administered Medications  Medication Dose Route Frequency Provider Last Rate Last Admin  . 0.9 %  sodium chloride infusion   Intravenous Continuous Alma Friendly, MD 75 mL/hr at 09/10/19 0057 New Bag at 09/10/19 0057  . acetaminophen (TYLENOL) tablet 650 mg  650 mg Oral Q6H PRN Alma Friendly, MD       Or  . acetaminophen (TYLENOL) suppository 650 mg  650 mg Rectal Q6H PRN Alma Friendly, MD      . aspirin EC tablet 81 mg  81 mg Oral Daily Alma Friendly, MD  81 mg at 09/10/19 1016  . atorvastatin (LIPITOR) tablet 20 mg  20 mg Oral Daily Alma Friendly, MD   20 mg at 09/10/19 1016  . brimonidine (ALPHAGAN) 0.2 % ophthalmic solution 1 drop  1 drop Both Eyes BID Alma Friendly, MD   1 drop at 09/10/19 1017  . cefTRIAXone (ROCEPHIN) 1 g in sodium chloride 0.9 % 100 mL IVPB  1 g Intravenous Q24H Alma Friendly, MD 200 mL/hr at 09/09/19 2129 1 g at 09/09/19 2129  . chlorhexidine (PERIDEX) 0.12 % solution 15 mL  15 mL Mouth/Throat Daily PRN Alma Friendly, MD      . dorzolamide-timolol (COSOPT) 22.3-6.8 MG/ML ophthalmic solution 1 drop  1 drop Both Eyes BID  Alma Friendly, MD   1 drop at 09/10/19 1018  . feeding supplement (ENSURE ENLIVE) (ENSURE ENLIVE) liquid 237 mL  237 mL Oral BID BM Alma Friendly, MD   237 mL at 09/09/19 1348  . heparin injection 5,000 Units  5,000 Units Subcutaneous Q8H Alma Friendly, MD   5,000 Units at 09/10/19 0550  . hydrALAZINE (APRESOLINE) injection 10 mg  10 mg Intravenous Q8H PRN Alma Friendly, MD   10 mg at 09/04/19 2156  . HYDROcodone-acetaminophen (NORCO/VICODIN) 5-325 MG per tablet 1-2 tablet  1-2 tablet Oral Q4H PRN Alma Friendly, MD      . labetalol (NORMODYNE) injection 10 mg  10 mg Intravenous Q2H PRN Alma Friendly, MD      . LORazepam (ATIVAN) injection 0.5 mg  0.5 mg Intravenous TID PRN Alma Friendly, MD   0.5 mg at 09/10/19 0546  . multivitamin with minerals tablet 1 tablet  1 tablet Oral Daily Alma Friendly, MD   1 tablet at 09/10/19 1016  . Netarsudil Dimesylate 0.02 % SOLN 1 drop  1 drop Both Eyes Daily Alma Friendly, MD   1 drop at 09/09/19 1333  . ondansetron (ZOFRAN) tablet 4 mg  4 mg Oral Q6H PRN Alma Friendly, MD       Or  . ondansetron [facility]) injection 4 mg  4 mg Intravenous Q6H PRN Alma Friendly, MD   4 mg at 09/09/19 1754  . polyethylene glycol (MIRALAX / GLYCOLAX) packet 17 g  17 g Oral Daily Alma Friendly, MD   17 g at 09/09/19 1036  . QUEtiapine (SEROQUEL) tablet 50 mg  50 mg Oral BID Alma Friendly, MD   50 mg at 09/10/19 1016   And  . QUEtiapine (SEROQUEL) tablet 300 mg  300 mg Oral QHS Alma Friendly, MD   300 mg at 09/09/19 2125  . senna-docusate (Senokot-S) tablet 1 tablet  1 tablet Oral BID Alma Friendly, MD   1 tablet at 09/09/19 1037  . Travoprost (BAK Free) (TRAVATAN) 0.004 % ophthalmic solution SOLN 1 drop  1 drop Both Eyes QHS Alma Friendly, MD   1 drop at 09/09/19 2132     Discharge Medications: Please see discharge summary for a list of discharge medications.  Relevant  Imaging Results:  Relevant Lab Results:   Additional Information ss# [id number]  Ankur Snowdon, Marjie Skiff, RN

## 2019-09-11 DIAGNOSIS — Z7189 Other specified counseling: Secondary | ICD-10-CM

## 2019-09-11 DIAGNOSIS — Z515 Encounter for palliative care: Secondary | ICD-10-CM

## 2019-09-11 DIAGNOSIS — F0391 Unspecified dementia with behavioral disturbance: Secondary | ICD-10-CM

## 2019-09-11 LAB — COMPREHENSIVE METABOLIC PANEL
ALT: 56 U/L — ABNORMAL HIGH (ref 0–44)
AST: 49 U/L — ABNORMAL HIGH (ref 15–41)
Albumin: 3.1 g/dL — ABNORMAL LOW (ref 3.5–5.0)
Alkaline Phosphatase: 76 U/L (ref 38–126)
Anion gap: 14 (ref 5–15)
BUN: 19 mg/dL (ref 8–23)
CO2: 17 mmol/L — ABNORMAL LOW (ref 22–32)
Calcium: 9 mg/dL (ref 8.9–10.3)
Chloride: 112 mmol/L — ABNORMAL HIGH (ref 98–111)
Creatinine, Ser: 1.84 mg/dL — ABNORMAL HIGH (ref 0.44–1.00)
GFR calc Af Amer: 31 mL/min — ABNORMAL LOW (ref >60)
GFR calc non Af Amer: 27 mL/min — ABNORMAL LOW (ref >60)
Glucose, Bld: 69 mg/dL — ABNORMAL LOW (ref 70–99)
Potassium: 3.6 mmol/L (ref 3.5–5.1)
Sodium: 143 mmol/L (ref 135–145)
Total Bilirubin: 0.6 mg/dL (ref 0.3–1.2)
Total Protein: 6.5 g/dL (ref 6.5–8.1)

## 2019-09-11 LAB — CBC WITH DIFFERENTIAL/PLATELET
Abs Immature Granulocytes: 0.07 10*3/uL (ref 0.00–0.07)
Basophils Absolute: 0 10*3/uL (ref 0.0–0.1)
Basophils Relative: 1 %
Eosinophils Absolute: 0.1 10*3/uL (ref 0.0–0.5)
Eosinophils Relative: 2 %
HCT: 28.2 % — ABNORMAL LOW (ref 36.0–46.0)
Hemoglobin: 8.7 g/dL — ABNORMAL LOW (ref 12.0–15.0)
Immature Granulocytes: 1 %
Lymphocytes Relative: 22 %
Lymphs Abs: 1.4 10*3/uL (ref 0.7–4.0)
MCH: 29.7 pg (ref 26.0–34.0)
MCHC: 30.9 g/dL (ref 30.0–36.0)
MCV: 96.2 fL (ref 80.0–100.0)
Monocytes Absolute: 0.8 10*3/uL (ref 0.1–1.0)
Monocytes Relative: 13 %
Neutro Abs: 3.7 10*3/uL (ref 1.7–7.7)
Neutrophils Relative %: 61 %
Platelets: 138 10*3/uL — ABNORMAL LOW (ref 150–400)
RBC: 2.93 MIL/uL — ABNORMAL LOW (ref 3.87–5.11)
RDW: 13.7 % (ref 11.5–15.5)
WBC: 6.1 10*3/uL (ref 4.0–10.5)
nRBC: 0 % (ref 0.0–0.2)

## 2019-09-11 LAB — MAGNESIUM: Magnesium: 1.6 mg/dL — ABNORMAL LOW (ref 1.7–2.4)

## 2019-09-11 LAB — PHOSPHORUS: Phosphorus: 2.8 mg/dL (ref 2.5–4.6)

## 2019-09-11 MED ORDER — LORAZEPAM 2 MG/ML IJ SOLN
0.5000 mg | INTRAMUSCULAR | Status: DC | PRN
  Administered 2019-09-11: 0.5 mg via INTRAVENOUS
  Filled 2019-09-11: qty 1

## 2019-09-11 MED ORDER — LACTATED RINGERS IV SOLN: INTRAVENOUS | Status: DC

## 2019-09-11 NOTE — Progress Notes (Signed)
PT Cancellation Note  Patient Details Name: [place] Mae Smestad MRN: [id number] DOB: [birth year]   Cancelled Treatment:    Reason Eval/Treat Not Completed: Fatigue/lethargy limiting ability to participate - Pt declined mobility due to fatigue/sleeping, with max verbal encouragement from PT. PT to check back at another time.   [place] Pager [phone]  Office [phone]  Roxine Caddy D Elonda Husky 09/11/2019, 4:02 PM

## 2019-09-11 NOTE — Progress Notes (Signed)
Palliative Medicine Team consult was received.   I was able to reach patient's daughter and she prefers to meet in person tomorrow afternoon for family meeting.  She will be at the hospital at [facility].   If there are urgent needs or questions please call [phone]. Thank you for consulting out team to assist with this patients care.  Micheline Rough, MD [place] Palliative Medicine Team [phone]  NO CHARGE NOTE

## 2019-09-11 NOTE — Progress Notes (Signed)
PROGRESS NOTE    Stacy Brennan  [id number] DOB: January 30, 1946 DOA: 09/04/2019 PCP: Dorothyann Peng, NP   Brief Narrative:  Stacy Brennan 74 y.o.femalewith medical history significant fordementia, hypertension, CKD, CVA, depression, glaucoma, cataracts, was brought to the ED by her daughter complaining of poor oral intake for the past 2-3weeks,only able to drink Ensure and small amounts of water/juice intermittently. Not taking any medication for the past 3 days. Patient also has not had any bowel movements in the past couple of days. Patient has significant history of dementia with visual hallucinations and insomnia.Patient is under the care of palliative as an outpatient. EDP had an extensive discussion with daughter, specifically stated no SNF admission,wants patient back home once hydrated and stabilized. In the ED, VSS except for uncontrolled BP,labs showed creatinine of 3.84 above baseline around 1.2, BUN 116, hemoglobin 12.6 which is higher than her baseline around 10.COVID-19 test pending,UA/UC/chest x-ray currently are pending.CT abdomen/pelvis done was unremarkable.EDP disimpacted patient as well as suppositories with good results. Patient admitted forIV hydration and stabilization.  Assessment & Plan:   Principal Problem:   AKI (acute kidney injury) ([place]) Active Problems:   Stroke ([place])   Hypertension   Glaucoma   Anxiety state   Hyperlipidemia   Renal insufficiency   Depression  Goals of care: poor prognosis overall.  Poor oral intake over past several weeks.  Dr. Johnette Abraham discussed with daughter on 2/9 and at that time, daughter was leaning towards hospice care.  Family meeting today, planning for inpatient hospice after discussion.  Poor oral intake/failure to thrive Likely related to??progressive dementia Poor oral intake for the past 2 to 3 weeks CT abdomen/pelvis unremarkable Ruled out any source of infection, UA negative, UC showed multiple  species Repeat chest x-ray unremarkable, COVID-19 negative Repeat UA positive for infection, UC e coli and e faecalis, follow sensitivities BC x2 pending IV fluids Continue IV ceftriaxone Dietitian consulted  AKIon CKD stage III with metabolic acidosis Likely 2/2 above Creatinine on admission 3.84, baseline around 1.23 - stable from yesterday today Transition to LR, follow  CT abdomen/pelvis showed no renal abnormalities Ultrasound renal, unremarkable Daily BMP  Pyuria Follow repeat cx (e. Coli and e. Faecalis UTI), continue abx   ??Orthostatic hypotension Repeat orthostatic vitals Q shift Continue IVF PT/OT  Hypokalemia Replace as needed  Acute urinary retention Insert Foley Strict I's and O's Plan for voiding trial  Constipation EDP disimpacted patient with good results in the ED Bowel regimen  Hypertension hydralazine as needed, labetalol as needed Hold home Aldactone, verapamil  History of CVA Continue home aspirin, Lipitor  History of glaucoma/cataracts/legally blind Continue multiple eyedrops  Dementia with behavioral disturbance History of visual hallucination,insomnia,anxiety Continue quetiapine,will hold ativan for now with lethargy (consider restarting prn) Pt will require safety sitter  Thrombocytopenia: continue to monitor  [place] Very poor prognosis overall.  Dr. Horris Latino discussed with daughter on 2/9 and appears daughter is leaning towards hospice at this point.  Palliative care c/s  DVT prophylaxis: heparin Code Status: full Family Communication: none at bedside - daughter 2/10 Disposition Plan:  . Patient came from: home            . Anticipated d/c place: pending further goc discussions . Barriers to d/c OR conditions which need to be met to effect Jemmie Rhinehart safe d/c: additional goc discussions  Consultants:   Palliative care  Procedures:  Echo 1/31 IMPRESSIONS    1. The left ventricle has hyperdynamic systolic function  of >42%. The  cavity size is decreased. There is mild left ventricular wall thickness.  Echo evidence of indeterminate diastolic filling patterns.  2. Normal left atrial size.  3. Normal right atrial size.  4. The mitral valve thickened. There is mild thickening. Regurgitation is  not visualized by color flow Doppler.  5. Normal tricuspid valve.  6. Tricuspid regurgitation is mild.  7. The aortic valve tricuspid. There is moderate thickening and moderate  calcification of the aortic valve. Aortic valve regurgitation is mild by  color flow Doppler.  8. Pulmonic valve regurgitation is mild by color flow Doppler.  9. No atrial level shunt detected by color flow Doppler.   Antimicrobials:  Anti-infectives (From admission, onward)   Start     Dose/Rate Route Frequency Ordered Stop   09/09/19 1815  cefTRIAXone (ROCEPHIN) 1 g in sodium chloride 0.9 % 100 mL IVPB     1 g 200 mL/hr over 30 Minutes Intravenous Every 24 hours 09/09/19 1815     09/09/19 1600  cefTRIAXone (ROCEPHIN) 1 g in sodium chloride 0.9 % 100 mL IVPB  Status:  Discontinued     1 g 200 mL/hr over 30 Minutes Intravenous Every 24 hours 09/09/19 1515 09/09/19 1519     Subjective: Nadene Witherspoon&Ox1-2 Sleepy  Objective: Vitals:   09/10/19 1454 09/10/19 2302 09/11/19 0400 09/11/19 1611  BP: 136/64 (!) 142/79 (!) 178/92 (!) 152/89  Pulse: (!) 105 98 100 (!) 111  Resp: '20 19 18 20  '$ Temp: 98.5 F (36.9 C) 98.8 F (37.1 C) 98.6 F (37 C) 98.8 F (37.1 C)  TempSrc: Oral Axillary Axillary Oral  SpO2: 98% 98%  99%  Weight:      Height:        Intake/Output Summary (Last 24 hours) at 09/11/2019 1754 Last data filed at 09/11/2019 0300 Gross per 24 hour  Intake 694.8 ml  Output 350 ml  Net 344.8 ml   Filed Weights   09/06/19 0700  Weight: 66 kg    Examination:  General: No acute distress. Cardiovascular: Heart sounds show Isaura Schiller regular rate, and rhythm. Lungs: Clear to auscultation bilaterally Abdomen: Soft, nontender,  nondistended with normal active bowel sounds. No masses. No hepatosplenomegaly. Neurological: Alert and oriented 1-2. Moves all extremities 4. Cranial nerves II through XII grossly intact. Skin: Warm and dry. No rashes or lesions. Extremities: No clubbing or cyanosis. No edema.   Data Reviewed: I have personally reviewed following labs and imaging studies  CBC: Recent Labs  Lab 09/07/19 0517 09/08/19 0451 09/09/19 0528 09/10/19 0442 09/11/19 0443  WBC 7.4 8.4 8.0 7.6 6.1  NEUTROABS 4.7 5.6 5.4 5.0 3.7  HGB 9.5* 9.5* 9.0* 8.9* 8.7*  HCT 29.3* 29.2* 26.6* 28.0* 28.2*  MCV 91.6 92.1 88.4 94.3 96.2  PLT 148* 133* 120* 133* 468*   Basic Metabolic Panel: Recent Labs  Lab 09/07/19 0517 09/08/19 0451 09/09/19 0528 09/10/19 0442 09/11/19 0443  NA 141 137 140 141 143  K 3.3* 3.3* 3.8 3.6 3.6  CL 111 109 116* 115* 112*  CO2 22 19* 15* 17* 17*  GLUCOSE 113* 108* 84 79 69*  BUN 31* '21 18 18 19  '$ CREATININE 2.21* 1.92* 1.93* 1.84* 1.84*  CALCIUM 8.2* 7.9* 7.9* 8.2* 9.0  MG  --   --   --   --  1.6*  PHOS  --   --   --   --  2.8   GFR: Estimated Creatinine Clearance: 26.1 mL/min (Thanya Cegielski) (by C-G formula based on SCr of 1.84 mg/dL (H)).  Liver Function Tests: Recent Labs  Lab 09/05/19 0433 09/11/19 0443  AST 29 49*  ALT 22 56*  ALKPHOS 94 76  BILITOT 0.8 0.6  PROT 7.2 6.5  ALBUMIN 3.8 3.1*   No results for input(s): LIPASE, AMYLASE in the last 168 hours. No results for input(s): AMMONIA in the last 168 hours. Coagulation Profile: No results for input(s): INR, PROTIME in the last 168 hours. Cardiac Enzymes: No results for input(s): CKTOTAL, CKMB, CKMBINDEX, TROPONINI in the last 168 hours. BNP (last 3 results) No results for input(s): PROBNP in the last 8760 hours. HbA1C: No results for input(s): HGBA1C in the last 72 hours. CBG: No results for input(s): GLUCAP in the last 168 hours. Lipid Profile: No results for input(s): CHOL, HDL, LDLCALC, TRIG, CHOLHDL, LDLDIRECT in  the last 72 hours. Thyroid Function Tests: No results for input(s): TSH, T4TOTAL, FREET4, T3FREE, THYROIDAB in the last 72 hours. Anemia Panel: No results for input(s): VITAMINB12, FOLATE, FERRITIN, TIBC, IRON, RETICCTPCT in the last 72 hours. Sepsis Labs: No results for input(s): PROCALCITON, LATICACIDVEN in the last 168 hours.  Recent Results (from the past 240 hour(s))  SARS CORONAVIRUS 2 (TAT 6-24 HRS) Nasopharyngeal Nasopharyngeal Swab     Status: None   Collection Time: 09/04/19  4:54 PM   Specimen: Nasopharyngeal Swab  Result Value Ref Range Status   SARS Coronavirus 2 NEGATIVE NEGATIVE Final    Comment: (NOTE) SARS-CoV-2 target nucleic acids are NOT DETECTED. The SARS-CoV-2 RNA is generally detectable in upper and lower respiratory specimens during the acute phase of infection. Negative results do not preclude SARS-CoV-2 infection, do not rule out co-infections with other pathogens, and should not be used as the sole basis for treatment or other patient management decisions. Negative results must be combined with clinical observations, patient history, and epidemiological information. The expected result is Negative. Fact Sheet for Patients: SugarRoll.be Fact Sheet for Healthcare Providers: https://www.woods-mathews.com/ This test is not yet approved or cleared by the [place] FDA and  has been authorized for detection and/or diagnosis of SARS-CoV-2 by FDA under an Emergency Use Authorization (EUA). This EUA will remain  in effect (meaning this test can be used) for the duration of the COVID-19 declaration under Section 56 4(b)(1) of the Act, 21 U.S.C. section 360bbb-3(b)(1), unless the authorization is terminated or revoked sooner. Performed at [facility], [place] [address], [place], [place] [address]   Urine culture     Status: Abnormal   Collection Time: 09/05/19 10:02 AM   Specimen: Urine, Clean Catch  Result Value  Ref Range Status   Specimen Description   Final    URINE, CLEAN CATCH Performed at [facility], [place] [address], [place], [place] [address]    Special Requests   Final    NONE Performed at [facility], [place] [address], [place], [place] [address]    Culture MULTIPLE SPECIES PRESENT, SUGGEST RECOLLECTION (Jaquavian Firkus)  Final   Report Status 09/06/2019 FINAL  Final  Culture, blood (routine x 2)     Status: None (Preliminary result)   Collection Time: 09/09/19  9:12 AM   Specimen: BLOOD RIGHT HAND  Result Value Ref Range Status   Specimen Description   Final    BLOOD RIGHT HAND Performed at [facility] [address], [place],  [address]    Special Requests   Final    BOTTLES DRAWN AEROBIC ONLY Blood Culture results may not be optimal due to an inadequate volume  of blood received in culture bottles Performed at [facility], [place] [address], [place], [place] [address]    Culture   Final    NO GROWTH 2 DAYS Performed at [facility] [address] [place], [place] [address]    Report Status PENDING  Incomplete  Culture, blood (routine x 2)     Status: None (Preliminary result)   Collection Time: 09/09/19  9:12 AM   Specimen: BLOOD  Result Value Ref Range Status   Specimen Description   Final    BLOOD RIGHT ARM Performed at [facility] [address] [place], [place] [address]    Special Requests   Final    BOTTLES DRAWN AEROBIC AND ANAEROBIC Blood Culture adequate volume Performed at [facility] [address] [place], [place] [address]    Culture   Final    NO GROWTH 2 DAYS Performed at [place] [address], [place], [place] [address]    Report Status PENDING  Incomplete  Culture, Urine     Status: Abnormal (Preliminary result)   Collection Time: 09/09/19  3:00 PM   Specimen: Urine, Clean Catch  Result Value Ref Range Status    Specimen Description   Final    URINE, CLEAN CATCH Performed at [facility], [place] [address] [place], [place] [address]    Special Requests   Final    NONE Performed at [facility], [place] [address] [place], [place] [address]    Culture (Anaiz Qazi)  Final    >=100,000 COLONIES/mL ESCHERICHIA COLI 80,000 COLONIES/mL ENTEROCOCCUS FAECALIS SUSCEPTIBILITIES TO FOLLOW Performed at [facility], [place] [address], [place], [place] [address]    Report Status PENDING  Incomplete         Radiology Studies: No results found.      Scheduled Meds: . aspirin EC  81 mg Oral Daily  . atorvastatin  20 mg Oral Daily  . brimonidine  1 drop Both Eyes BID  . dorzolamide-timolol  1 drop Both Eyes BID  . feeding supplement (ENSURE ENLIVE)  237 mL Oral BID BM  . heparin  5,000 Units Subcutaneous Q8H  . multivitamin with minerals  1 tablet Oral Daily  . Netarsudil Dimesylate  1 drop Both Eyes Daily  . polyethylene glycol  17 g Oral Daily  . QUEtiapine  50 mg Oral BID   And  . QUEtiapine  300 mg Oral QHS  . senna-docusate  1 tablet Oral BID  . Travoprost (BAK Free)  1 drop Both Eyes QHS   Continuous Infusions: . cefTRIAXone (ROCEPHIN)  IV 1 g (09/11/19 1752)     LOS: 6 days    Time spent: over 30 min    Fayrene Helper, MD [facility]   To contact the attending provider between 7A-7P or the covering provider during after hours 7P-7A, please log into the web site [email or web address] and access using universal [place] password for that web site. If you do not have the password, please call the hospital operator.  09/11/2019, 5:54 PM

## 2019-09-11 NOTE — Progress Notes (Signed)
Palliative care consult note  Reason for consult: Goals of care in light of advanced dementia with poor p.o. intake  Palliative care consult received.  Chart reviewed including personal review of pertinent labs and imaging.  Discussed case with bedside RN as well as Dr. Florene Glen.  Briefly, Ms. Howell is a 74 year old female with past medical history of dementia, hypertension, CKD, CVA, depression, glaucoma, cataracts was brought to the ED with poor oral intake for the last several weeks.  Prior to admission she was taking only small amounts of Ensure and juice.  She was admitted with AKI, failure to thrive, hypokalemia, and constipation with disimpaction in the ED.  She has exhibited signs of sundowning and agitation throughout admission.  Palliative consulted for goals of care.  I saw and examined Ms. Krueger today.  She is lying in bed and is pleasant and cooperative to examination.  She is very pleasant in conversation and expressed gratitude for visit today.  She does not have insight into the severity of her condition nor is she able to participate in conversation regarding long-term goals of care.  She does retain the ability for social conversation at this point.  I met today with patient's daughters, Lexine Baton and [name].  Dr. Florene Glen also joined [place] for part of conversation.  Her daughters report that Ms. Holzheimer is an independent, fun-loving, giving woman.  In the past, she worked at [employer] but then stopped in order to raise her children.  She has 2 sons and 2 daughters as well as more than 10 grandchildren.  Her faith is also very important to her.  She is legally blind and finds most of her joy in being able to talk with some of her close friends and family on the telephone.  Her daughters report that the physicians have been doing a good job explaining things to them and they understand be concerned that she is not going to maintain her nutrition or hydration moving forward.  They have been  talking with care team and state today that after discussion with family they are not interested in any further conversation about pursuing PEG tube and they also believe that her CODE STATUS should be changed to DNR in the event of cardiac or respiratory arrest.  We further discussed CODE STATUS and how heroic interventions would not be likely to lead to her ever being well enough to leave the hospital in a meaningful way in the event of cardiac or respiratory arrest.  I let them know that I would change CODE STATUS to DNR following our conversation.  Her daughters report that she has had continued decline in her nutrition, cognition, and functional status over the last several months.  This became acutely worse in the past 5 weeks during which time she stopped eating and drinking much of anything.  She was admitted to the hospital with dehydration and is still taking and next to no nutrition nor hydration.  We talked about the natural course of dementia and the fact that her no longer taking in sufficient nutrition or hydration is likely signal that her body is in the process of beginning to shut down.  We talked about options for care moving forward as [name] expressed that her major concern is that Ms. Chaviano does not suffer.  Ms. Bartelt is followed by [facility] as an outpatient with their palliative care program and [name] has been talking with Gonzella Lex about her mother and long term goals as well.  Per her  daughter's request, we discussed options for hospice care including home hospice and residential hospice facility.  We discussed hospice philosophy as well as support provided in the home environment.  Her daughter whom has been primary caregiver reports that she does not think she is going to be able to continue to care for her in the manner she needs moving forward even with the support of hospice.  We discussed residential hospice and care that is provided.  Lexine Baton reports that she would be  concerned about stopping some of her chronic medications and requested further clarification about what care plan would entail if she were approved for residential hospice.  We talked about limited prognosis and the fact that as she came in dehydrated with acute kidney injury, this is likely to recur as soon as we stop hospital-based interventions such as artificial hydration.  We talked about potential prognosis of less than 2 weeks as at this point time she is not taking in any meaningful p.o. nutrition or hydration.  While she actually looks better today than she did on admission to the hospital, we talked about how this is a very temporary thing once hospital-based interventions are stopped as she is going to go back into renal failure quickly after stopping artificial nutrition and hydration.  Family had a lot of concerns regarding artificial nutrition hydration and concerned that they would be "starving" their mother or depriving her basic needs if they agreed to forego artificial nutrition and hydration.  We talked about natural progression of dementia and that it is in and of itself a terminal diagnosis.  Discussed natural death and burdens that can be associated with artificial nutrition hydration in person who is approaching end-of-life.  -DNR/DNI. -Following lengthy discussion, family would like for consult to be placed for possible placement at [facility] for end-of-life care.  They would like to discuss further with hospice liaison prior to making final decision about care plan.  I will place a consult to transition of care team in order to help facilitate.  Start time: 1540 End time: 1710 Total time: 90 minutes  Greater than 50%  of this time was spent counseling and coordinating care related to the above assessment and plan.  Micheline Rough, MD [facility] Team [phone]

## 2019-09-12 LAB — URINE CULTURE: Culture: >=100000 — AB

## 2019-09-12 MED ORDER — LORAZEPAM 2 MG/ML IJ SOLN
0.5000 mg | Freq: Three times a day (TID) | INTRAMUSCULAR | Status: DC | PRN
  Administered 2019-09-12 – 2019-09-13 (×2): 0.5 mg via INTRAVENOUS
  Filled 2019-09-12 (×2): qty 1

## 2019-09-12 MED ORDER — FOSFOMYCIN TROMETHAMINE 3 G PO PACK
3.0000 g | PACK | Freq: Once | ORAL | Status: DC
  Filled 2019-09-12: qty 3

## 2019-09-12 MED ORDER — LACTATED RINGERS IV SOLN: INTRAVENOUS | Status: DC

## 2019-09-12 NOTE — Progress Notes (Signed)
Physical Therapy Treatment Patient Details Name: Stacy Brennan MRN: [id number] DOB: 1946/01/18 Today's Date: 09/12/2019    History of Present Illness Stacy Brennan is a 74 y.o. female who [facility] to [facility] due to p.o. intake with fatigue. PMG significant of dementia, hypertension, CKD, CVA, depression, glaucoma, and cataracts.    PT Comments    Pt in bed easily aroused but confused.  General Comments: AxO x 1 following repeat functional commands.  c/o of fatigue cause "I was working in the kitchen all morning" and "taking care of babies". Assisted OOB to amb.  General transfer comment: + 2 side by side hand held assist due to cognition and low vision.  Assisted from elevated bed to amb. General Gait Details: + 2 side by side hand held assist from [facility] to recliner with incomplete turns and assist to control stand to sit.  Limited by weakness, cognition and low vision. Positioned in recliner to comfort.  "I want water".  Assisted with a few sips.    Follow Up Recommendations  Other (comment)(per chart review, D/C plans ate for Residential Hospice)     Equipment Recommendations       Recommendations for Other Services       Precautions / Restrictions Precautions Precautions: Fall Precaution Comments: Pt is legally blind, Hx dementia Restrictions Weight Bearing Restrictions: No    Mobility  Bed Mobility Overal bed mobility: Needs Assistance Bed Mobility: Supine to Sit     Supine to sit: Mod assist;+2 for physical assistance     General bed mobility comments: required + 2 assist to transfer to EOB.  Groggy, eyes closed, few verbal responce  Transfers Overall transfer level: Needs assistance Equipment used: 2 person hand held assist Transfers: Sit to/from [facility] Sit to Stand: Mod assist;+2 physical assistance;+2 safety/equipment Stand pivot transfers: Mod assist;Max assist;+2 physical assistance;+2 safety/equipment       General transfer comment: +  2 side by side hand held assist due to cognition and low vision.  Assisted from elevated bed to amb.  Ambulation/Gait Ambulation/Gait assistance: Min assist;+2 physical assistance;+2 safety/equipment;Mod assist Gait Distance (Feet): 22 Feet Assistive device: 2 person hand held assist Gait Pattern/deviations: Step-to pattern;Step-through pattern;Decreased step length - right;Decreased step length - left;Shuffle;Trunk flexed;Staggering left;Staggering right;Narrow base of support Gait velocity: decreased   General Gait Details: + 2 side by side hand held assist from [facility] to recliner with incomplete turns and assist to control stand to sit.  Limited by weakness, cognition and low vision.   Stairs             Wheelchair Mobility    Modified Rankin (Stroke Patients Only)       Balance                                            Cognition Arousal/Alertness: Awake/alert                                     General Comments: AxO x 1 following repeat functional commands.  c/o of fatigue cause "I was working in the kitchen all morning" and "taking care of babies".      Exercises      General Comments        Pertinent Vitals/Pain Pain Assessment: No/denies pain    Home  Living                      Prior Function            PT Goals (current goals can now be found in the care plan section) Progress towards PT goals: Progressing toward goals    Frequency    Min 3X/week      PT Plan Current plan remains appropriate    Co-evaluation              AM-PAC PT "6 Clicks" Mobility   Outcome Measure  Help needed turning from your back to your side while in a flat bed without using bedrails?: A Lot Help needed moving from lying on your back to sitting on the side of a flat bed without using bedrails?: A Lot Help needed moving to and from a bed to a chair (including a wheelchair)?: A Lot Help needed standing up from a  chair using your arms (e.g., wheelchair or bedside chair)?: A Lot Help needed to walk in hospital room?: A Lot Help needed climbing 3-5 steps with a railing? : Total 6 Click Score: 11    End of Session Equipment Utilized During Treatment: Gait belt Activity Tolerance: Patient limited by fatigue;Other (comment)(cognition) Patient left: in chair;with call bell/phone within reach;with chair alarm set Nurse Communication: Mobility status PT Visit Diagnosis: Unsteadiness on feet (R26.81);Muscle weakness (generalized) (M62.81);Difficulty in walking, not elsewhere classified (R26.2)     Time: [id number] PT Time Calculation (min) (ACUTE ONLY): 25 min  Charges:  $Gait Training: 8-22 mins $Therapeutic Activity: 8-22 mins                     Rica Koyanagi  PTA Acute  Rehabilitation Services Pager      [phone] Office      [phone]

## 2019-09-12 NOTE — Progress Notes (Signed)
   Vital Signs MEWS/VS Documentation      09/11/2019 0701 09/11/2019 1611 09/11/2019 1828 09/11/2019 1946   MEWS Score:  2  4  3  3    MEWS Score Color:  Yellow  Red  Yellow  Yellow   Resp:  --  20  20  18    Pulse:  --  (!) 111  (!) 103  (!) 101   BP:  --  (!) 152/89  (!) 153/83  126/75   Temp:  --  98.8 F (37.1 C)  98.6 F (37 C)  98.4 F (36.9 C)   O2 Device:  --  Room Air  Room Air  Room Air   Level of Consciousness:  Responds to Pain  --  --  --       No an acute change protocol already imitated.        Melrose Nakayama 09/12/2019,12:12 AM

## 2019-09-12 NOTE — Care Management Important Message (Signed)
Important Message  Patient Details IM Letter given to Marney Doctor RN Case Manager to present to the Patient Name: Stacy Brennan MRN: [id number] Date of Birth: 07/07/1946   Medicare Important Message Given:  Yes     Kerin Salen 09/12/2019, 10:34 AM

## 2019-09-12 NOTE — TOC Transition Note (Signed)
Transition of Care [facility]) - CM/SW Discharge Note   Patient Details  Name: Stacy Brennan MRN: [id number] Date of Birth: Aug 07, 1945  Transition of [facility]) CM/SW Contact:  Lynnell Catalan, RN Phone Number: 09/12/2019, 1:13 PM   Clinical Narrative:     [facility] consult for referral to [facility]. Pt was already active with [facility] for palliative services. [facility] liaison contacted for referral.    Barriers to Discharge: Continued Medical Work up   Discharge Plan and Services   Discharge Planning Services: CM Consult Post Acute Care Choice: Home Health              Readmission Risk Interventions Readmission Risk Prevention Plan 09/08/2019  Transportation Screening Complete  PCP or Specialist Appt within 3-5 Days Complete  HRI or Home Care Consult Complete  Social Work Consult for [address] Planning/Counseling Complete  Palliative Care Screening Not Applicable  Medication Review [employer]) Complete  Some recent data might be hidden

## 2019-09-12 NOTE — Progress Notes (Signed)
PROGRESS NOTE    Stacy Brennan  ZOX:[id number] DOB: 04-Jun-1946 DOA: 09/04/2019 PCP: Dorothyann Peng, NP   Brief Narrative:  Stacy Brennan Stacy Brennan 74 y.o.femalewith medical history significant fordementia, hypertension, CKD, CVA, depression, glaucoma, cataracts, was brought to the ED by her daughter complaining of poor oral intake for the past 2-3weeks,only able to drink Ensure and small amounts of water/juice intermittently. Not taking any medication for the past 3 days. Patient also has not had any bowel movements in the past couple of days. Patient has significant history of dementia with visual hallucinations and insomnia.Patient is under the care of palliative as an outpatient. EDP had an extensive discussion with daughter, specifically stated no SNF admission,wants patient back home once hydrated and stabilized. In the ED, VSS except for uncontrolled BP,labs showed creatinine of 3.84 above baseline around 1.2, BUN 116, hemoglobin 12.6 which is higher than her baseline around 10.COVID-19 test pending,UA/UC/chest x-ray currently are pending.CT abdomen/pelvis done was unremarkable.EDP disimpacted patient as well as suppositories with good results. Patient admitted forIV hydration and stabilization.  Assessment & Plan:   Principal Problem:   AKI (acute kidney injury) ([place]) Active Problems:   Stroke ([place])   Hypertension   Glaucoma   Anxiety state   Hyperlipidemia   Renal insufficiency   Depression  Goals of care: poor prognosis overall.  Poor oral intake over past several weeks.  Dr. Johnette Abraham discussed with daughter on 2/9 and at that time, daughter was leaning towards hospice care.  Family meeting 2/11, planning for inpatient hospice.  Poor oral intake/failure to thrive Likely related to??progressive dementia Poor oral intake for the past 2 to 3 weeks CT abdomen/pelvis unremarkable Ruled out any source of infection, UA negative, UC showed multiple species Repeat chest  x-ray unremarkable, COVID-19 negative Repeat UA positive for infection, UC e coli and e faecalis, both pan sensitive  BC x2 NG x 3 days IV fluids Give dose of fosfomycin for UTI x1 Dietitian consulted  AKIon CKD stage III with metabolic acidosis Likely 2/2 above Creatinine on admission 3.84, baseline around 1.23 - holding on additional labs with plan for residential hospice Transition to [facility], follow  CT abdomen/pelvis showed no renal abnormalities Ultrasound renal, unremarkable Daily BMP  Pyuria Follow repeat cx (e. Coli and e. Faecalis UTI), fosfomycin and stop further abx   ??Orthostatic hypotension Will continue to monitor  Hypokalemia Replace as needed  Acute urinary retention Foley catheter in place, with plan for hospice, will leave in place Strict I's and O's Plan for voiding trial  Constipation EDP disimpacted patient with good results in the ED Bowel regimen  Hypertension hydralazine as needed, labetalol as needed Hold home Aldactone, verapamil  History of CVA Continue home aspirin, Lipitor  History of glaucoma/cataracts/legally blind Continue multiple eyedrops  Dementia with behavioral disturbance History of visual hallucination,insomnia,anxiety Continue quetiapine,will hold ativan for now with lethargy (consider restarting prn) Pt will require safety sitter  Thrombocytopenia: continue to monitor  [place] Very poor prognosis overall.  Dr. Horris Latino discussed with daughter on 2/9 and appears daughter is leaning towards hospice at this point.  Palliative care c/s  DVT prophylaxis: heparin Code Status: full Family Communication: none at bedside - daughter 2/11 Disposition Plan:  . Patient came from: home            . Anticipated d/c place: pending further goc discussions . Barriers to d/c OR conditions which need to be met to effect Stacy Brennan safe d/c: pending residential hospice placement  Consultants:   Palliative  care  Procedures:   Echo 1/31 IMPRESSIONS    1. The left ventricle has hyperdynamic systolic function of >16%. The  cavity size is decreased. There is mild left ventricular wall thickness.  Echo evidence of indeterminate diastolic filling patterns.  2. Normal left atrial size.  3. Normal right atrial size.  4. The mitral valve thickened. There is mild thickening. Regurgitation is  not visualized by color flow Doppler.  5. Normal tricuspid valve.  6. Tricuspid regurgitation is mild.  7. The aortic valve tricuspid. There is moderate thickening and moderate  calcification of the aortic valve. Aortic valve regurgitation is mild by  color flow Doppler.  8. Pulmonic valve regurgitation is mild by color flow Doppler.  9. No atrial level shunt detected by color flow Doppler.   Antimicrobials:  Anti-infectives (From admission, onward)   Start     Dose/Rate Route Frequency Ordered Stop   09/12/19 1000  fosfomycin (MONUROL) packet 3 g     3 g Oral  Once 09/12/19 0838     09/09/19 1815  cefTRIAXone (ROCEPHIN) 1 g in sodium chloride 0.9 % 100 mL IVPB  Status:  Discontinued     1 g 200 mL/hr over 30 Minutes Intravenous Every 24 hours 09/09/19 1815 09/12/19 0838   09/09/19 1600  cefTRIAXone (ROCEPHIN) 1 g in sodium chloride 0.9 % 100 mL IVPB  Status:  Discontinued     1 g 200 mL/hr over 30 Minutes Intravenous Every 24 hours 09/09/19 1515 09/09/19 1519     Subjective: Caylie Sandquist&Ox1 No complaints, sleepy  Objective: Vitals:   09/11/19 1946 09/12/19 0020 09/12/19 0410 09/12/19 0825  BP: 126/75 (!) 159/84 (!) 145/78 (!) 175/88  Pulse: (!) 101 (!) 107 (!) 108 (!) 111  Resp: '18 16 19 20  '$ Temp: 98.4 F (36.9 C) 97.9 F (36.6 C) 98 F (36.7 C) (!) 97.5 F (36.4 C)  TempSrc: Oral Oral Oral Oral  SpO2: 97% 97% 99% 100%  Weight:      Height:        Intake/Output Summary (Last 24 hours) at 09/12/2019 1425 Last data filed at 09/12/2019 1200 Gross per 24 hour  Intake --  Output 1075 ml  Net -1075 ml    Filed Weights   09/06/19 0700  Weight: 66 kg    Examination:  General: No acute distress. Cardiovascular: Heart sounds show Karsin Pesta regular rate, and rhythm.  Lungs: Clear to auscultation bilaterally  Abdomen: Soft, nontender, nondistended  Neurological: Alert and oriented 1, lethargic. Moves all extremities 4. Cranial nerves II through XII grossly intact. Skin: Warm and dry. No rashes or lesions. Extremities: No clubbing or cyanosis. No edema.   Data Reviewed: I have personally reviewed following labs and imaging studies  CBC: Recent Labs  Lab 09/07/19 0517 09/08/19 0451 09/09/19 0528 09/10/19 0442 09/11/19 0443  WBC 7.4 8.4 8.0 7.6 6.1  NEUTROABS 4.7 5.6 5.4 5.0 3.7  HGB 9.5* 9.5* 9.0* 8.9* 8.7*  HCT 29.3* 29.2* 26.6* 28.0* 28.2*  MCV 91.6 92.1 88.4 94.3 96.2  PLT 148* 133* 120* 133* 109*   Basic Metabolic Panel: Recent Labs  Lab 09/07/19 0517 09/08/19 0451 09/09/19 0528 09/10/19 0442 09/11/19 0443  NA 141 137 140 141 143  K 3.3* 3.3* 3.8 3.6 3.6  CL 111 109 116* 115* 112*  CO2 22 19* 15* 17* 17*  GLUCOSE 113* 108* 84 79 69*  BUN 31* '21 18 18 19  '$ CREATININE 2.21* 1.92* 1.93* 1.84* 1.84*  CALCIUM 8.2* 7.9* 7.9* 8.2* 9.0  MG  --   --   --   --  1.6*  PHOS  --   --   --   --  2.8   GFR: Estimated Creatinine Clearance: 26.1 mL/min (Kala Ambriz) (by C-G formula based on SCr of 1.84 mg/dL (H)). Liver Function Tests: Recent Labs  Lab 09/11/19 0443  AST 49*  ALT 56*  ALKPHOS 76  BILITOT 0.6  PROT 6.5  ALBUMIN 3.1*   No results for input(s): LIPASE, AMYLASE in the last 168 hours. No results for input(s): AMMONIA in the last 168 hours. Coagulation Profile: No results for input(s): INR, PROTIME in the last 168 hours. Cardiac Enzymes: No results for input(s): CKTOTAL, CKMB, CKMBINDEX, TROPONINI in the last 168 hours. BNP (last 3 results) No results for input(s): PROBNP in the last 8760 hours. HbA1C: No results for input(s): HGBA1C in the last 72 hours. CBG: No  results for input(s): GLUCAP in the last 168 hours. Lipid Profile: No results for input(s): CHOL, HDL, LDLCALC, TRIG, CHOLHDL, LDLDIRECT in the last 72 hours. Thyroid Function Tests: No results for input(s): TSH, T4TOTAL, FREET4, T3FREE, THYROIDAB in the last 72 hours. Anemia Panel: No results for input(s): VITAMINB12, FOLATE, FERRITIN, TIBC, IRON, RETICCTPCT in the last 72 hours. Sepsis Labs: No results for input(s): PROCALCITON, LATICACIDVEN in the last 168 hours.  Recent Results (from the past 240 hour(s))  SARS CORONAVIRUS 2 (TAT 6-24 HRS) Nasopharyngeal Nasopharyngeal Swab     Status: None   Collection Time: 09/04/19  4:54 PM   Specimen: Nasopharyngeal Swab  Result Value Ref Range Status   SARS Coronavirus 2 NEGATIVE NEGATIVE Final    Comment: (NOTE) SARS-CoV-2 target nucleic acids are NOT DETECTED. The SARS-CoV-2 RNA is generally detectable in upper and lower respiratory specimens during the acute phase of infection. Negative results do not preclude SARS-CoV-2 infection, do not rule out co-infections with other pathogens, and should not be used as the sole basis for treatment or other patient management decisions. Negative results must be combined with clinical observations, patient history, and epidemiological information. The expected result is Negative. Fact Sheet for Patients: SugarRoll.be Fact Sheet for Healthcare Providers: https://www.woods-mathews.com/ This test is not yet approved or cleared by the [place] FDA and  has been authorized for detection and/or diagnosis of SARS-CoV-2 by FDA under an Emergency Use Authorization (EUA). This EUA will remain  in effect (meaning this test can be used) for the duration of the COVID-19 declaration under Section 56 4(b)(1) of the Act, 21 U.S.C. section 360bbb-3(b)(1), unless the authorization is terminated or revoked sooner. Performed at [facility], [place] [address]  [place], [place] [address]   Urine culture     Status: Abnormal   Collection Time: 09/05/19 10:02 AM   Specimen: Urine, Clean Catch  Result Value Ref Range Status   Specimen Description   Final    URINE, CLEAN CATCH Performed at [facility], [place] [address] [place], [place] [address]    Special Requests   Final    NONE Performed at [facility], [place] [address] [place], [place] [address]    Culture MULTIPLE SPECIES PRESENT, SUGGEST RECOLLECTION (Kourosh Jablonsky)  Final   Report Status 09/06/2019 FINAL  Final  Culture, blood (routine x 2)     Status: None (Preliminary result)   Collection Time: 09/09/19  9:12 AM   Specimen: BLOOD RIGHT HAND  Result Value Ref Range Status   Specimen Description   Final    BLOOD RIGHT HAND  Performed at [facility], [place] [address], [place], [place] [address]    Special Requests   Final    BOTTLES DRAWN AEROBIC ONLY Blood Culture results may not be optimal due to an inadequate volume of blood received in culture bottles Performed at [place] [address] [place], [place] [address]    Culture   Final    NO GROWTH 3 DAYS Performed at [facility], [place] [address], [place], [place] [address]    Report Status PENDING  Incomplete  Culture, blood (routine x 2)     Status: None (Preliminary result)   Collection Time: 09/09/19  9:12 AM   Specimen: BLOOD  Result Value Ref Range Status   Specimen Description   Final    BLOOD RIGHT ARM Performed at [place] [address] [place], [place] [address]    Special Requests   Final    BOTTLES DRAWN AEROBIC AND ANAEROBIC Blood Culture adequate volume Performed at [place] [address] [place], [place] [address]    Culture   Final    NO GROWTH 3 DAYS Performed at [facility], [place] [address], [place], [place] [address]    Report Status PENDING  Incomplete  Culture, Urine      Status: Abnormal   Collection Time: 09/09/19  3:00 PM   Specimen: Urine, Clean Catch  Result Value Ref Range Status   Specimen Description   Final    URINE, CLEAN CATCH Performed at [facility], [place] [address] [place],  [address]    Special Requests   Final    NONE Performed at [facility], [place] [address] [place], [place] [address]    Culture (Nazaiah Navarrete)  Final    >=100,000 COLONIES/mL ESCHERICHIA COLI 80,000 COLONIES/mL ENTEROCOCCUS FAECALIS    Report Status 09/12/2019 FINAL  Final   Organism ID, Bacteria ESCHERICHIA COLI (Ryanne Morand)  Final   Organism ID, Bacteria ENTEROCOCCUS FAECALIS (Tate Zagal)  Final      Susceptibility   Escherichia coli - MIC*    AMPICILLIN <=2 SENSITIVE Sensitive     CEFAZOLIN <=4 SENSITIVE Sensitive     CEFTRIAXONE <=0.25 SENSITIVE Sensitive     CIPROFLOXACIN <=0.25 SENSITIVE Sensitive     GENTAMICIN <=1 SENSITIVE Sensitive     IMIPENEM <=0.25 SENSITIVE Sensitive     NITROFURANTOIN <=16 SENSITIVE Sensitive     TRIMETH/SULFA <=20 SENSITIVE Sensitive     AMPICILLIN/SULBACTAM <=2 SENSITIVE Sensitive     PIP/TAZO <=4 SENSITIVE Sensitive     * >=100,000 COLONIES/mL ESCHERICHIA COLI   Enterococcus faecalis - MIC*    AMPICILLIN <=2 SENSITIVE Sensitive     NITROFURANTOIN <=16 SENSITIVE Sensitive     VANCOMYCIN 2 SENSITIVE Sensitive     * 80,000 COLONIES/mL ENTEROCOCCUS FAECALIS         Radiology Studies: No results found.      Scheduled Meds: . aspirin EC  81 mg Oral Daily  . atorvastatin  20 mg Oral Daily  . brimonidine  1 drop Both Eyes BID  . dorzolamide-timolol  1 drop Both Eyes BID  . feeding supplement (ENSURE ENLIVE)  237 mL Oral BID BM  . fosfomycin  3 g Oral Once  . heparin  5,000 Units Subcutaneous Q8H  . multivitamin with minerals  1 tablet Oral Daily  . Netarsudil Dimesylate  1 drop Both Eyes Daily  . polyethylene glycol  17 g Oral Daily  . QUEtiapine  50 mg Oral BID   And  . QUEtiapine  300 mg Oral  QHS  . senna-docusate  1 tablet Oral BID  . Travoprost (BAK Free)  1 drop Both Eyes QHS   Continuous Infusions: . lactated ringers 75 mL/hr at 09/11/19 2014     LOS: 7 days    Time spent: over 30 min    Fayrene Helper, MD [facility]   To contact the attending provider between 7A-7P or the covering provider during after hours 7P-7A, please log into the web site [email or web address] and access using universal [place] password for that web site. If you do not have the password, please call the hospital operator.  09/12/2019, 2:25 PM

## 2019-09-12 NOTE — Progress Notes (Addendum)
Manufacturing engineer  Referral received for residential hospice at [facility].  [place] does not have an available bed today.  Will update family and TOC manager when bed status changes.  Venia Carbon RN, BSN, [facility] (in [phone]  **spoke with family, explained services and answered questions.  Pt is eligible for [facility].  Family requests time to discuss and could not agree to move forward with [facility] tomorrow, requests to transfer on Sunday.  Pending bed availability for Sunday once family confirms plan.

## 2019-09-13 NOTE — Progress Notes (Signed)
Palliative care consult note  Reason for consult: Goals of care in light of advanced dementia with poor p.o. intake  I saw and examined Stacy Brennan today.  She was sleepier today and did not really converse with me.  I called and was able to reach patient's daughter, Stacy Brennan.    Chart review reveals that bed offer was made for [facility] today.  I discussed with Stacy Brennan again regarding residential hospice and care that is provided.  Discussed that she is going to go back into renal failure quickly since stopping artificial nutrition and hydration.    Stacy Brennan reports that she wanted to ensure consensus of family prior to agreeing to transfer which is why transfer declined today.  Family is in agreement with transition to residential hospice tomorrow.  Stacy Brennan would like to be notified of time of transfer prior to it occurring so that she can be present to support her mother on arrival.  -DNR/DNI. -Family would like placement at [facility] for end-of-life care.  They are in agreement with plan for transfer tomorrow if bed available.  Start time: 1120 End time: 1140 Total time: 20 minutes  Greater than 50%  of this time was spent counseling and coordinating care related to the above assessment and plan.  Micheline Rough, MD [place] Team [phone]

## 2019-09-13 NOTE — Progress Notes (Signed)
[facility] does have a bed to offer pt today. Writer spoke with pt's daughter who declined placement today. Stated that she would like pt to transfer tomorrow. Liaison explained that we cannot guarantee a bed tomorrow and Kimmie verbalized understanding stating that Monday would be fine as well.   Liaison will communicate bed availability with TOC and family tomorrow.   Please do not hesitated to reach out with any questions.   Thank you,  Freddie Breech, RN [phone]

## 2019-09-13 NOTE — Progress Notes (Signed)
PROGRESS NOTE    Stacy Brennan  WPY:[id number] DOB: 06-23-1946 DOA: 09/04/2019 PCP: Dorothyann Peng, NP   Brief Narrative:  Stacy Mae Adamsis Stacy Brennan 74 y.o.femalewith medical history significant fordementia, hypertension, CKD, CVA, depression, glaucoma, cataracts, was brought to the ED by her daughter complaining of poor oral intake for the past 2-3weeks,only able to drink Ensure and small amounts of water/juice intermittently. Not taking any medication for the past 3 days. Patient also has not had any bowel movements in the past couple of days. Patient has significant history of dementia with visual hallucinations and insomnia.Patient is under the care of palliative as an outpatient. EDP had an extensive discussion with daughter, specifically stated no SNF admission,wants patient back home once hydrated and stabilized. In the ED, VSS except for uncontrolled BP,labs showed creatinine of 3.84 above baseline around 1.2, BUN 116, hemoglobin 12.6 which is higher than her baseline around 10.COVID-19 test pending,UA/UC/chest x-ray currently are pending.CT abdomen/pelvis done was unremarkable.EDP disimpacted patient as well as suppositories with good results. Patient admitted forIV hydration and stabilization.  Assessment & Plan:   Principal Problem:   AKI (acute kidney injury) ([place]) Active Problems:   Stroke ([place])   Hypertension   Glaucoma   Anxiety state   Hyperlipidemia   Renal insufficiency   Depression  Goals of care: poor prognosis overall.  Poor oral intake over past several weeks.  Dr. Johnette Abraham discussed with daughter on 2/9 and at that time, daughter was leaning towards hospice care.  Family meeting 2/11, planning for inpatient hospice.  Poor oral intake/failure to thrive Likely related to??progressive dementia Poor oral intake for the past 2 to 3 weeks CT abdomen/pelvis unremarkable Ruled out any source of infection, UA negative, UC showed multiple species Repeat chest  x-ray unremarkable, COVID-19 negative Repeat UA positive for infection, UC e coli and e faecalis, both pan sensitive  BC x2 NG x 3 days IV fluids Give dose of fosfomycin for UTI x1 Dietitian consulted  AKIon CKD stage III with metabolic acidosis Likely 2/2 above Creatinine on admission 3.84, baseline around 1.23 - holding on additional labs with plan for residential hospice [facility] to [facility], follow  CT abdomen/pelvis showed no renal abnormalities Ultrasound renal, unremarkable Daily BMP  Pyuria Follow repeat cx (e. Coli and e. Faecalis UTI), fosfomycin and stop further abx   ??Orthostatic hypotension Will continue to monitor  Hypokalemia Replace as needed  Acute urinary retention Foley catheter in place, with plan for hospice, will leave in place Strict I's and O's Plan for voiding trial  Constipation EDP disimpacted patient with good results in the ED Bowel regimen  Hypertension hydralazine as needed, labetalol as needed Hold home Aldactone, verapamil  History of CVA Continue home aspirin, Lipitor  History of glaucoma/cataracts/legally blind Continue multiple eyedrops  Dementia with behavioral disturbance History of visual hallucination,insomnia,anxiety Continue quetiapine,will hold ativan for now with lethargy (consider restarting prn) Pt will require safety sitter  Thrombocytopenia: continue to monitor  DVT prophylaxis: heparin Code Status: full Family Communication: none at bedside - daughter 2/11 Disposition Plan:  . Patient came from: home            . Anticipated d/c place: pending further goc discussions . Barriers to d/c OR conditions which need to be met to effect Stacy Brennan safe d/c: pending residential hospice placement -> likely d/c on 2/14  Consultants:   Palliative care  Procedures:  Echo 1/31 IMPRESSIONS    1. The left ventricle has hyperdynamic systolic function of >05%. The  cavity  size is decreased. There is mild left  ventricular wall thickness.  Echo evidence of indeterminate diastolic filling patterns.  2. Normal left atrial size.  3. Normal right atrial size.  4. The mitral valve thickened. There is mild thickening. Regurgitation is  not visualized by color flow Doppler.  5. Normal tricuspid valve.  6. Tricuspid regurgitation is mild.  7. The aortic valve tricuspid. There is moderate thickening and moderate  calcification of the aortic valve. Aortic valve regurgitation is mild by  color flow Doppler.  8. Pulmonic valve regurgitation is mild by color flow Doppler.  9. No atrial level shunt detected by color flow Doppler.   Antimicrobials:  Anti-infectives (From admission, onward)   Start     Dose/Rate Route Frequency Ordered Stop   09/12/19 1000  fosfomycin (MONUROL) packet 3 g     3 g Oral  Once 09/12/19 0838     09/09/19 1815  cefTRIAXone (ROCEPHIN) 1 g in sodium chloride 0.9 % 100 mL IVPB  Status:  Discontinued     1 g 200 mL/hr over 30 Minutes Intravenous Every 24 hours 09/09/19 1815 09/12/19 0838   09/09/19 1600  cefTRIAXone (ROCEPHIN) 1 g in sodium chloride 0.9 % 100 mL IVPB  Status:  Discontinued     1 g 200 mL/hr over 30 Minutes Intravenous Every 24 hours 09/09/19 1515 09/09/19 1519     Subjective: Sleepy and confused No complaints  Objective: Vitals:   09/13/19 0041 09/13/19 0407 09/13/19 0905 09/13/19 1234  BP: 128/71 (!) 111/55 (!) 142/92 (!) 168/94  Pulse: (!) 106 (!) 106 (!) 122 (!) 113  Resp: '20 18 20 18  '$ Temp: 99 F (37.2 C) 97.8 F (36.6 C) 98.9 F (37.2 C) 97.9 F (36.6 C)  TempSrc: Oral Oral Oral Oral  SpO2: 97%  98% 98%  Weight:      Height:        Intake/Output Summary (Last 24 hours) at 09/13/2019 1449 Last data filed at 09/13/2019 1401 Gross per 24 hour  Intake 255.4 ml  Output 1250 ml  Net -994.6 ml   Filed Weights   09/06/19 0700  Weight: 66 kg    Examination:  General: No acute distress. Cardiovascular: Heart sounds show Stacy Brennan regular  rate, and rhythm. Lungs: Clear to auscultation bilaterally  Abdomen: Soft, nontender, nondistended Neurological: sleepy, confused. Moves all extremities 4. Cranial nerves II through XII grossly intact. Skin: Warm and dry. No rashes or lesions. Extremities: No clubbing or cyanosis. No edema.   Data Reviewed: I have personally reviewed following labs and imaging studies  CBC: Recent Labs  Lab 09/07/19 0517 09/08/19 0451 09/09/19 0528 09/10/19 0442 09/11/19 0443  WBC 7.4 8.4 8.0 7.6 6.1  NEUTROABS 4.7 5.6 5.4 5.0 3.7  HGB 9.5* 9.5* 9.0* 8.9* 8.7*  HCT 29.3* 29.2* 26.6* 28.0* 28.2*  MCV 91.6 92.1 88.4 94.3 96.2  PLT 148* 133* 120* 133* 300*   Basic Metabolic Panel: Recent Labs  Lab 09/07/19 0517 09/08/19 0451 09/09/19 0528 09/10/19 0442 09/11/19 0443  NA 141 137 140 141 143  K 3.3* 3.3* 3.8 3.6 3.6  CL 111 109 116* 115* 112*  CO2 22 19* 15* 17* 17*  GLUCOSE 113* 108* 84 79 69*  BUN 31* '21 18 18 19  '$ CREATININE 2.21* 1.92* 1.93* 1.84* 1.84*  CALCIUM 8.2* 7.9* 7.9* 8.2* 9.0  MG  --   --   --   --  1.6*  PHOS  --   --   --   --  2.8   GFR: Estimated Creatinine Clearance: 26.1 mL/min (Stacy Brennan) (by C-G formula based on SCr of 1.84 mg/dL (H)). Liver Function Tests: Recent Labs  Lab 09/11/19 0443  AST 49*  ALT 56*  ALKPHOS 76  BILITOT 0.6  PROT 6.5  ALBUMIN 3.1*   No results for input(s): LIPASE, AMYLASE in the last 168 hours. No results for input(s): AMMONIA in the last 168 hours. Coagulation Profile: No results for input(s): INR, PROTIME in the last 168 hours. Cardiac Enzymes: No results for input(s): CKTOTAL, CKMB, CKMBINDEX, TROPONINI in the last 168 hours. BNP (last 3 results) No results for input(s): PROBNP in the last 8760 hours. HbA1C: No results for input(s): HGBA1C in the last 72 hours. CBG: No results for input(s): GLUCAP in the last 168 hours. Lipid Profile: No results for input(s): CHOL, HDL, LDLCALC, TRIG, CHOLHDL, LDLDIRECT in the last 72  hours. Thyroid Function Tests: No results for input(s): TSH, T4TOTAL, FREET4, T3FREE, THYROIDAB in the last 72 hours. Anemia Panel: No results for input(s): VITAMINB12, FOLATE, FERRITIN, TIBC, IRON, RETICCTPCT in the last 72 hours. Sepsis Labs: No results for input(s): PROCALCITON, LATICACIDVEN in the last 168 hours.  Recent Results (from the past 240 hour(s))  SARS CORONAVIRUS 2 (TAT 6-24 HRS) Nasopharyngeal Nasopharyngeal Swab     Status: None   Collection Time: 09/04/19  4:54 PM   Specimen: Nasopharyngeal Swab  Result Value Ref Range Status   SARS Coronavirus 2 NEGATIVE NEGATIVE Final    Comment: (NOTE) SARS-CoV-2 target nucleic acids are NOT DETECTED. The SARS-CoV-2 RNA is generally detectable in upper and lower respiratory specimens during the acute phase of infection. Negative results do not preclude SARS-CoV-2 infection, do not rule out co-infections with other pathogens, and should not be used as the sole basis for treatment or other patient management decisions. Negative results must be combined with clinical observations, patient history, and epidemiological information. The expected result is Negative. Fact Sheet for Patients: SugarRoll.be Fact Sheet for Healthcare Providers: https://www.woods-mathews.com/ This test is not yet approved or cleared by the [place] FDA and  has been authorized for detection and/or diagnosis of SARS-CoV-2 by FDA under an Emergency Use Authorization (EUA). This EUA will remain  in effect (meaning this test can be used) for the duration of the COVID-19 declaration under Section 56 4(b)(1) of the Act, 21 U.S.C. section 360bbb-3(b)(1), unless the authorization is terminated or revoked sooner. Performed at [facility], [place] [address] [place], [place] [address]   Urine culture     Status: Abnormal   Collection Time: 09/05/19 10:02 AM   Specimen: Urine, Clean Catch  Result Value Ref Range  Status   Specimen Description   Final    URINE, CLEAN CATCH Performed at [facility], [place] [address], [place], [place] [address]    Special Requests   Final    NONE Performed at [facility], [place] [address], [place], [place] [address]    Culture MULTIPLE SPECIES PRESENT, SUGGEST RECOLLECTION (Stacy Brennan)  Final   Report Status 09/06/2019 FINAL  Final  Culture, blood (routine x 2)     Status: None (Preliminary result)   Collection Time: 09/09/19  9:12 AM   Specimen: BLOOD RIGHT HAND  Result Value Ref Range Status   Specimen Description   Final    BLOOD RIGHT HAND Performed at [place] [address] [place], [place] [address]    Special Requests   Final    BOTTLES DRAWN AEROBIC ONLY  Blood Culture results may not be optimal due to an inadequate volume of blood received in culture bottles Performed at [place] [address] [place], [place] [address]    Culture   Final    NO GROWTH 3 DAYS Performed at [facility], [place] [address] [place], [place] [address]    Report Status PENDING  Incomplete  Culture, blood (routine x 2)     Status: None (Preliminary result)   Collection Time: 09/09/19  9:12 AM   Specimen: BLOOD  Result Value Ref Range Status   Specimen Description   Final    BLOOD RIGHT ARM Performed at [place] [address] [place], [place] [address]    Special Requests   Final    BOTTLES DRAWN AEROBIC AND ANAEROBIC Blood Culture adequate volume Performed at [place] [address] [place], [place] [address]    Culture   Final    NO GROWTH 3 DAYS Performed at [facility], [place] [address], [place], [place] [address]    Report Status PENDING  Incomplete  Culture, Urine     Status: Abnormal   Collection Time: 09/09/19  3:00 PM   Specimen: Urine, Clean Catch  Result Value Ref Range Status   Specimen Description   Final     URINE, CLEAN CATCH Performed at [facility], [place] [address], [place], [place] [address]    Special Requests   Final    NONE Performed at [facility], [place] [address] [place], [place] [address]    Culture (Stacy Brennan)  Final    >=100,000 COLONIES/mL ESCHERICHIA COLI 80,000 COLONIES/mL ENTEROCOCCUS FAECALIS    Report Status 09/12/2019 FINAL  Final   Organism ID, Bacteria ESCHERICHIA COLI (Stacy Brennan)  Final   Organism ID, Bacteria ENTEROCOCCUS FAECALIS (Stacy Brennan)  Final      Susceptibility   Escherichia coli - MIC*    AMPICILLIN <=2 SENSITIVE Sensitive     CEFAZOLIN <=4 SENSITIVE Sensitive     CEFTRIAXONE <=0.25 SENSITIVE Sensitive     CIPROFLOXACIN <=0.25 SENSITIVE Sensitive     GENTAMICIN <=1 SENSITIVE Sensitive     IMIPENEM <=0.25 SENSITIVE Sensitive     NITROFURANTOIN <=16 SENSITIVE Sensitive     TRIMETH/SULFA <=20 SENSITIVE Sensitive     AMPICILLIN/SULBACTAM <=2 SENSITIVE Sensitive     PIP/TAZO <=4 SENSITIVE Sensitive     * >=100,000 COLONIES/mL ESCHERICHIA COLI   Enterococcus faecalis - MIC*    AMPICILLIN <=2 SENSITIVE Sensitive     NITROFURANTOIN <=16 SENSITIVE Sensitive     VANCOMYCIN 2 SENSITIVE Sensitive     * 80,000 COLONIES/mL ENTEROCOCCUS FAECALIS         Radiology Studies: No results found.      Scheduled Meds: . aspirin EC  81 mg Oral Daily  . atorvastatin  20 mg Oral Daily  . brimonidine  1 drop Both Eyes BID  . dorzolamide-timolol  1 drop Both Eyes BID  . feeding supplement (ENSURE ENLIVE)  237 mL Oral BID BM  . fosfomycin  3 g Oral Once  . heparin  5,000 Units Subcutaneous Q8H  . multivitamin with minerals  1 tablet Oral Daily  . Netarsudil Dimesylate  1 drop Both Eyes Daily  . polyethylene glycol  17 g Oral Daily  . QUEtiapine  50 mg Oral BID   And  . QUEtiapine  300 mg Oral QHS  . senna-docusate  1 tablet Oral BID  . Travoprost (BAK  Free)  1 drop Both Eyes QHS   Continuous Infusions:    LOS: 8 days    Time spent:  over 30 min    Fayrene Helper, MD [facility]   To contact the attending provider between 7A-7P or the covering provider during after hours 7P-7A, please log into the web site [email or web address] and access using universal [place] password for that web site. If you do not have the password, please call the hospital operator.  09/13/2019, 2:49 PM

## 2019-09-14 LAB — CULTURE, BLOOD (ROUTINE X 2)
Culture: NO GROWTH
Culture: NO GROWTH
Special Requests: ADEQUATE

## 2019-09-14 MED ORDER — FOSFOMYCIN TROMETHAMINE 3 G PO PACK
3.0000 g | PACK | Freq: Once | ORAL | Status: AC
  Administered 2019-09-14: 3 g via ORAL
  Filled 2019-09-14: qty 3

## 2019-09-14 NOTE — Discharge Summary (Signed)
Physician Discharge Summary  Stacy Brennan [id number] DOB: July 05, 1946 DOA: 09/04/2019  PCP: Stacy Peng, NP  Admit date: 09/04/2019 Discharge date: 09/14/2019  Time spent: 40 minutes  Recommendations for Outpatient Follow-up:  1. Follow up with hospice for comfort measures   Discharge Diagnoses:  Principal Problem:   AKI (acute kidney injury) ([place]) Active Problems:   Stroke [facility])   Hypertension   Glaucoma   Anxiety state   Hyperlipidemia   Renal insufficiency   Depression   Discharge Condition: stable  Diet recommendation: as tolerated  Filed Weights   09/06/19 0700  Weight: 66 kg   History of present illness:  Stacy Brennan 74 y.o.femalewith medical history significant fordementia, hypertension, CKD, CVA, depression, glaucoma, cataracts, was brought to the ED by her daughter complaining of poor oral intake for the past 2-3weeks,only able to drink Ensure and small amounts of water/juice intermittently. Not taking any medication for the past 3 days. Patient also has not had any bowel movements in the past couple of days. Patient has significant history of dementia with visual hallucinations and insomnia.Patient is under the care of palliative as an outpatient. EDP had an extensive discussion with daughter, specifically stated no SNF admission,wants patient back home once hydrated and stabilized. In the ED, VSS except for uncontrolled BP,labs showed creatinine of 3.84 above baseline around 1.2, BUN 116, hemoglobin 12.6 which is higher than her baseline around 10.COVID-19 test pending,UA/UC/chest x-ray currently are pending.CT abdomen/pelvis done was unremarkable.EDP disimpacted patient as well as suppositories with good results. Patient admitted forIV hydration and stabilization.  She was admitted with poor oral intake.  Since admission, she's continued to have minimal oral intake.  Palliative care was consulted and decision was made to transition  to hospice care.  Plan for [facility].  See below for additional details.    Hospital Course:  Goals of care: poor prognosis overall.  Poor oral intake over past several weeks.  Dr. Johnette Brennan discussed with daughter on 2/9 and at that time, daughter was leaning towards hospice care.  Family meeting 2/11, planning for inpatient hospice.  Discharge to hospice on 2/14  Poor oral intake/failure to thrive Likely related to??progressive dementia Poor oral intake for the past 2 to 3 weeks CT abdomen/pelvis unremarkable Ruled out any source of infection, UA negative, UC showed multiple species Repeat chest x-ray unremarkable, COVID-19 negative Repeat UApositive for infection,UC e coli and e faecalis, both pan sensitive - denies UTI symptoms (low suspicion this is contributing to above), will treat with dose of fosfomycin (refused initial dose, will try again prior to discharge) BC x2 NG x 4 days IV fluids Dietitian consulted  AKIon CKD stage IIIwith metabolic acidosis Likely 2/2 above Creatinine on admission 3.84, baseline around 1.23 - holding on additional labs with plan for residential hospice Transition to [facility] [place], follow  CT abdomen/pelvis showed no renal abnormalities Ultrasound renal,unremarkable Daily BMP  UTI Repeat cx with e. Coli and e. Faecalis UTI.  Fosfomycin ordered, but she did not take this med.  No typical UTI sx, no fever, no WBC, low suspicion for symptomatic UTI.  Will attempt fosfomycin again prior to d/c.     Orthostatic hypotension Will continue to monitor  Hypokalemia Replace as needed  Acute urinary retention Foley catheter in place, with plan for hospice, will leave in place Strict I's and O's Plan for voiding trial  Constipation EDP disimpacted patient with good results in the ED Bowel regimen  Hypertension hydralazine as needed, labetalol as  needed Hold home Aldactone, verapamil  History of CVA Continue home aspirin,  Lipitor  History of glaucoma/cataracts/legally blind Continue multiple eyedrops  Dementia with behavioral disturbance History of visual hallucination,insomnia,anxiety Continue quetiapine,will hold ativan for now with lethargy (consider restarting prn) Pt will require safety sitter  Thrombocytopenia: continue to monitor  Procedures: Echo IMPRESSIONS    1. The left ventricle has hyperdynamic systolic function of [id number]. The  cavity size is decreased. There is mild left ventricular wall thickness.  Echo evidence of indeterminate diastolic filling patterns.  2. Normal left atrial size.  3. Normal right atrial size.  4. The mitral valve thickened. There is mild thickening. Regurgitation is  not visualized by color flow Doppler.  5. Normal tricuspid valve.  6. Tricuspid regurgitation is mild.  7. The aortic valve tricuspid. There is moderate thickening and moderate  calcification of the aortic valve. Aortic valve regurgitation is mild by  color flow Doppler.  8. Pulmonic valve regurgitation is mild by color flow Doppler.  9. No atrial level shunt detected by color flow Doppler.   Consultations:  Palliative care  Discharge Exam: Vitals:   09/14/19 0945 09/14/19 1050  BP: 135/84 (!) 144/85  Pulse: (!) 115 (!) 114  Resp: 16   Temp: 97.7 F (36.5 C) 97.7 F (36.5 C)  SpO2: 100% 99%   Confused, no complaints.  No UTI symptoms. Discussed with Stacy Brennan plan for discharge to hospice  General: No acute distress. Cardiovascular: Heart sounds show Stacy Brennan regular rate, and rhythm. Lungs: Clear to auscultation bilaterally Abdomen: Soft, nontender, nondistended Neurological: Alert and disoriented Moves all extremities 4. Cranial nerves II through XII grossly intact. Skin: Warm and dry. No rashes or lesions. Extremities: No clubbing or cyanosis. No edema.  Psychiatric:  Insight and judgment are impaired.  Discharge Instructions   Discharge Instructions    Discharge  instructions   Complete by: As directed    We're discharging you to [facility] for hospice and comfort care.     Allergies as of 09/14/2019      Reactions   Warfarin Palpitations   Doxycycline Rash   Aricept [donepezil Hcl] Other (See Comments)   Insomnia and not eating       Medication List    STOP taking these medications   labetalol 200 MG tablet Commonly known as: NORMODYNE   spironolactone 25 MG tablet Commonly known as: ALDACTONE   verapamil 240 MG CR tablet Commonly known as: CALAN-SR     TAKE these medications   alendronate 70 MG tablet Commonly known as: Fosamax Take 1 tablet (70 mg total) by mouth once Golda Zavalza week. Take with Daltin Crist full glass of water on an empty stomach. What changed: when to take this   ALPRAZolam 0.5 MG tablet Commonly known as: XANAX TAKE 1 TABLET BY MOUTH 3 TIMES DAILY AS NEEDED FOR ANXIETY.   aspirin EC 81 MG tablet Take 81 mg by mouth daily.   atorvastatin 20 MG tablet Commonly known as: LIPITOR TAKE 1 TABLET BY MOUTH EVERY DAY   brimonidine 0.2 % ophthalmic solution Commonly known as: ALPHAGAN Place 1 drop into both eyes 2 (two) times daily.   chlorhexidine 0.12 % solution Commonly known as: PERIDEX Use as directed 15 mLs in the mouth or throat daily as needed (mouth pain). Gum Diease   docusate sodium 100 MG capsule Commonly known as: COLACE Take 100 mg by mouth daily as needed for mild constipation.   dorzolamide-timolol 22.3-6.8 MG/ML ophthalmic solution Commonly known as: COSOPT Place 1  drop into both eyes 2 (two) times daily.   esomeprazole 40 MG capsule Commonly known as: NexIUM Take 1 capsule (40 mg total) by mouth 2 (two) times Shalia Bartko day.   METAMUCIL FIBER PO Take 5 mLs by mouth every evening. 1 Teaspoon daily   QUEtiapine 50 MG tablet Commonly known as: SEROQUEL Take 50-300 mg by mouth 3 (three) times daily. Take 1 in the morning, Take 1 in the afternoon and Take 6 tablets at bedtime   Rhopressa 0.02 % Soln Generic  drug: Netarsudil Dimesylate Place 1 drop into both eyes daily.   Travoprost (BAK Free) 0.004 % Soln ophthalmic solution Commonly known as: TRAVATAN Place 1 drop into both eyes at bedtime.      Allergies  Allergen Reactions  . Warfarin Palpitations  . Doxycycline Rash  . Aricept [Donepezil Hcl] Other (See Comments)    Insomnia and not eating       The results of significant diagnostics from this hospitalization (including imaging, microbiology, ancillary and laboratory) are listed below for reference.    Significant Diagnostic Studies: CT ABDOMEN PELVIS WO CONTRAST  Result Date: 09/04/2019 CLINICAL DATA:  Abdominal pain EXAM: CT ABDOMEN AND PELVIS WITHOUT CONTRAST TECHNIQUE: Multidetector CT imaging of the abdomen and pelvis was performed following the standard protocol without IV contrast. COMPARISON:  None. FINDINGS: Lower chest: The visualized heart size within normal limits. No pericardial fluid/thickening. No hiatal hernia. The visualized portions of the lungs are clear. Hepatobiliary: There is Ladona Rosten 2.6 cm low-density lesion anterior left liver lobe. No evidence of calcified gallstones or biliary ductal dilatation. Pancreas:  Unremarkable.  No surrounding inflammatory changes. Spleen: Normal in size. Although limited due to the lack of intravenous contrast, normal in appearance. Adrenals/Urinary Tract: Both adrenal glands appear normal. Right-sided renal atrophy is seen. There is compensatory slight hypertrophy of the left kidney. There is Keila Turan low-density lesion seen in the lower pole the left kidney measuring 3.8 cm, likely renal cyst. No renal or collecting system calculi seen. The bladder is unremarkable. Stomach/Bowel: The stomach, small bowel, and colon are normal in appearance. No inflammatory changes or obstructive findings. appendix is normal. Vascular/Lymphatic: There are no enlarged abdominal or pelvic lymph nodes. Scattered aortic atherosclerotic calcifications are seen without  aneurysmal dilatation. Reproductive: Partially calcified uterine fibroids are seen throughout. The adnexa are grossly unremarkable. Other: No evidence of abdominal wall mass or hernia. Musculoskeletal: No acute or significant osseous findings. IMPRESSION: No acute intra-abdominal or pelvic pathology to explain the patient's symptoms. Aortic Atherosclerosis (ICD10-I70.0). Electronically Signed   By: Prudencio Pair M.D.   On: 09/04/2019 15:48   US RENAL  Result Date: 09/09/2019 CLINICAL DATA:  Acute kidney injury. EXAM: RENAL / URINARY TRACT ULTRASOUND COMPLETE COMPARISON:  None. FINDINGS: Right Kidney: Not visualized as the patient is combative and unable to position properly. Left Kidney: Renal measurements: 12.1 x 6.4 x 6.0 cm = volume: 243 mL. 3.9 cm simple cyst is seen in lower pole. Echogenicity within normal limits. No mass or hydronephrosis visualized. Bladder: Unable to visualize due to limitations described above. Other: None. IMPRESSION: Right kidney and urinary bladder not visualized as patient could not be positioned properly. 3.9 cm simple left renal cyst is noted. Left kidney is otherwise unremarkable. Electronically Signed   By: Marijo Conception M.D.   On: 09/09/2019 10:31   DG Chest Port 1 View  Result Date: 09/09/2019 CLINICAL DATA:  74 y.o. female c/o weakness. Medical history significant for dementia, hypertension, CKD, CVA, depression, glaucoma, cataracts, was  brought to the ED by her daughter complaining of poor oral intake for the past 2-3 weeks EXAM: PORTABLE CHEST - 1 VIEW COMPARISON:  09/04/2019 FINDINGS: Lungs clear.  Heart size upper limits normal. Aortic Atherosclerosis (ICD10-170.0). No pleural effusion.  No pneumothorax. Visualized bones unremarkable. IMPRESSION: No acute cardiopulmonary disease. Electronically Signed   By: Lucrezia Europe M.D.   On: 09/09/2019 16:08   DG CHEST PORT 1 VIEW  Result Date: 09/04/2019 CLINICAL DATA:  74 year old female with failure to thrive. EXAM: PORTABLE  CHEST 1 VIEW COMPARISON:  None. FINDINGS: The lungs are clear. There is no pleural effusion or pneumothorax. The cardiac silhouette is within normal limits. Atherosclerotic calcification of the aorta. No acute osseous pathology. Osteopenia. IMPRESSION: No active disease. Electronically Signed   By: Anner Crete M.D.   On: 09/04/2019 18:22    Microbiology: Recent Results (from the past 240 hour(s))  SARS CORONAVIRUS 2 (TAT 6-24 HRS) Nasopharyngeal Nasopharyngeal Swab     Status: None   Collection Time: 09/04/19  4:54 PM   Specimen: Nasopharyngeal Swab  Result Value Ref Range Status   SARS Coronavirus 2 NEGATIVE NEGATIVE Final    Comment: (NOTE) SARS-CoV-2 target nucleic acids are NOT DETECTED. The SARS-CoV-2 RNA is generally detectable in upper and lower respiratory specimens during the acute phase of infection. Negative results do not preclude SARS-CoV-2 infection, do not rule out co-infections with other pathogens, and should not be used as the sole basis for treatment or other patient management decisions. Negative results must be combined with clinical observations, patient history, and epidemiological information. The expected result is Negative. Fact Sheet for Patients: SugarRoll.be Fact Sheet for Healthcare Providers: https://www.woods-mathews.com/ This test is not yet approved or cleared by the [place] FDA and  has been authorized for detection and/or diagnosis of SARS-CoV-2 by FDA under an Emergency Use Authorization (EUA). This EUA will remain  in effect (meaning this test can be used) for the duration of the COVID-19 declaration under Section 56 4(b)(1) of the Act, 21 U.S.C. section 360bbb-3(b)(1), unless the authorization is terminated or revoked sooner. Performed at [facility], [place] [address], [place], [place] [address]   Urine culture     Status: Abnormal   Collection Time: 09/05/19 10:02 AM   Specimen: Urine,  Clean Catch  Result Value Ref Range Status   Specimen Description   Final    URINE, CLEAN CATCH Performed at [facility], [place] [address] [place], [place] [address]    Special Requests   Final    NONE Performed at [facility], [place] [address] [place], [place] [address]    Culture MULTIPLE SPECIES PRESENT, SUGGEST RECOLLECTION (Dawsyn Ramsaran)  Final   Report Status 09/06/2019 FINAL  Final  Culture, blood (routine x 2)     Status: None (Preliminary result)   Collection Time: 09/09/19  9:12 AM   Specimen: BLOOD RIGHT HAND  Result Value Ref Range Status   Specimen Description   Final    BLOOD RIGHT HAND Performed at [place] [address] [place], [place] [address]    Special Requests   Final    BOTTLES DRAWN AEROBIC ONLY Blood Culture results may not be optimal due to an inadequate volume of blood received in culture bottles Performed at [place] [address] [place], [place] [address]    Culture   Final    NO GROWTH 4 DAYS Performed at [facility], [place]  [address] [place], [place] [address]    Report Status PENDING  Incomplete  Culture, blood (routine x 2)     Status: None (Preliminary result)   Collection Time: 09/09/19  9:12 AM   Specimen: BLOOD  Result Value Ref Range Status   Specimen Description   Final    BLOOD RIGHT ARM Performed at [place] [address] [place], [place] [address]    Special Requests   Final    BOTTLES DRAWN AEROBIC AND ANAEROBIC Blood Culture adequate volume Performed at [place] [address] [place], [place] [address]    Culture   Final    NO GROWTH 4 DAYS Performed at [facility], [place] [address], [place], [place] [address]    Report Status PENDING  Incomplete  Culture, Urine     Status: Abnormal   Collection Time: 09/09/19  3:00 PM   Specimen: Urine, Clean Catch  Result Value Ref Range Status    Specimen Description   Final    URINE, CLEAN CATCH Performed at [facility], [place] [address], [place],  [address]    Special Requests   Final    NONE Performed at [facility], [place] [address], [place],  [address]    Culture (Zoria Rawlinson)  Final    >=100,000 COLONIES/mL ESCHERICHIA COLI 80,000 COLONIES/mL ENTEROCOCCUS FAECALIS    Report Status 09/12/2019 FINAL  Final   Organism ID, Bacteria ESCHERICHIA COLI (Adrienna Karis)  Final   Organism ID, Bacteria ENTEROCOCCUS FAECALIS (Samoria Fedorko)  Final      Susceptibility   Escherichia coli - MIC*    AMPICILLIN <=2 SENSITIVE Sensitive     CEFAZOLIN <=4 SENSITIVE Sensitive     CEFTRIAXONE <=0.25 SENSITIVE Sensitive     CIPROFLOXACIN <=0.25 SENSITIVE Sensitive     GENTAMICIN <=1 SENSITIVE Sensitive     IMIPENEM <=0.25 SENSITIVE Sensitive     NITROFURANTOIN <=16 SENSITIVE Sensitive     TRIMETH/SULFA <=20 SENSITIVE Sensitive     AMPICILLIN/SULBACTAM <=2 SENSITIVE Sensitive     PIP/TAZO <=4 SENSITIVE Sensitive     * >=100,000 COLONIES/mL ESCHERICHIA COLI   Enterococcus faecalis - MIC*    AMPICILLIN <=2 SENSITIVE Sensitive     NITROFURANTOIN <=16 SENSITIVE Sensitive     VANCOMYCIN 2 SENSITIVE Sensitive     * 80,000 COLONIES/mL ENTEROCOCCUS FAECALIS     Labs: Basic Metabolic Panel: Recent Labs  Lab 09/08/19 0451 09/09/19 0528 09/10/19 0442 09/11/19 0443  NA 137 140 141 143  K 3.3* 3.8 3.6 3.6  CL 109 116* 115* 112*  CO2 19* 15* 17* 17*  GLUCOSE 108* 84 79 69*  BUN 21 18 18 19   CREATININE 1.92* 1.93* 1.84* 1.84*  CALCIUM 7.9* 7.9* 8.2* 9.0  MG  --   --   --  1.6*  PHOS  --   --   --  2.8   Liver Function Tests: Recent Labs  Lab 09/11/19 0443  AST 49*  ALT 56*  ALKPHOS 76  BILITOT 0.6  PROT 6.5  ALBUMIN 3.1*   No results for input(s): LIPASE, AMYLASE in the last 168 hours. No results for input(s): AMMONIA in the last 168 hours. CBC: Recent Labs  Lab 09/08/19 0451 09/09/19 0528 09/10/19 0442  09/11/19 0443  WBC 8.4 8.0 7.6 6.1  NEUTROABS 5.6 5.4 5.0 3.7  HGB 9.5* 9.0* 8.9* 8.7*  HCT 29.2* 26.6* 28.0* 28.2*  MCV 92.1 88.4 94.3 96.2  PLT 133* 120* 133* 138*  Cardiac Enzymes: No results for input(s): CKTOTAL, CKMB, CKMBINDEX, TROPONINI in the last 168 hours. BNP: BNP (last 3 results) No results for input(s): BNP in the last 8760 hours.  ProBNP (last 3 results) No results for input(s): PROBNP in the last 8760 hours.  CBG: No results for input(s): GLUCAP in the last 168 hours.     Signed:  Fayrene Helper MD.  [facility] 09/14/2019, 11:45 AM

## 2019-09-14 NOTE — Progress Notes (Signed)
Palliative care consult note  Reason for consult: Goals of care in light of advanced dementia with poor p.o. intake  I saw and examined Ms. Hyser today.  She was sleepier today and did not really converse with me. She does appear comfortable on my exam and is stable for transport.  -DNR/DNI. - Family requesting to visit this AM.  Agree with visitation per end of life exceptions. -Family would like placement at [facility] for end-of-life care.  They are in agreement with plan for transfer today.  Start time: 1120 End time: 1140 Total time: 20 minutes  Greater than 50%  of this time was spent counseling and coordinating care related to the above assessment and plan.  Micheline Rough, MD [place] Team [phone]

## 2019-09-14 NOTE — TOC Transition Note (Addendum)
Transition of Care [facility]) - CM/SW Discharge Note   Patient Details  Name: Stacy Brennan MRN: [id number] Date of Birth: 1946-06-17  Transition of Care [facility]) CM/SW Contact:  Lia Hopping, [place] Phone Number: 09/14/2019, 10:23 AM   Clinical Narrative:    [facility] will accept the patient today.  [facility] to transport.  Physician notified to complete dc summary   12:00PM-DC Summary faxed to [phone]. [facility] arrange for transport.   Final next level of care: Hospice Medical Facility([facility]) Barriers to Discharge: Barriers Resolved   Patient Goals and CMS Choice        Discharge Placement                Patient to be transferred to facility by: [facility]   Patient and family notified of of transfer: 09/14/19  Discharge Plan and Services   Discharge Planning Services: CM Consult Post Acute Care Choice: Home Health                               Social Determinants of Health (SDOH) Interventions     Readmission Risk Interventions Readmission Risk Prevention Plan 09/08/2019  Transportation Screening Complete  PCP or Specialist Appt within 3-5 Days Complete  HRI or [place] Complete  Social Work Consult for [place] Planning/Counseling Complete  Palliative Care Screening Not Applicable  Medication Review Press photographer) Complete  Some recent data might be hidden

## 2019-09-14 NOTE — Progress Notes (Signed)
[facility] Documentation  °   °Pt has been approved for [facility] transfer. [facility] does have a bed available for pt today. Paperwork has been completed and transportation can be arranged.   °   °Please call [facility] at [phone] to give charge nurse report and fax discharge summary to [phone].  °   °Please dc any lines. May leave catheter in place if pt has one. Please send pt to [facility] with DNR paperwork.   °   °Please call with any questions.   °   °Thank you,   °Jennifer Love, RN  ° °

## 2019-09-14 NOTE — Progress Notes (Signed)
   Vital Signs MEWS/VS Documentation      09/13/2019 1234 09/13/2019 1637 09/13/2019 2124 09/14/2019 0641   MEWS Score:  2  1  1  2    MEWS Score Color:  Yellow  Green  Green  Yellow   Resp:  18  16  -  18   Pulse:  (!) 113  (!) 109  -  (!) 120   BP:  (!) 168/94  (!) 143/83  -  (!) 147/95   Temp:  97.9 F (36.6 C)  98.5 F (36.9 C)  -  98.2 F (36.8 C)   O2 Device:  Room Air  Room Air  -  Room Air   Level of Consciousness:  -  -  Alert  -       Not an acute change algorithm initiated.      Melrose Nakayama 09/14/2019,6:47 AM

## 2019-09-29 ENCOUNTER — Ambulatory Visit: Payer: Medicare Other

## 2019-10-11 ENCOUNTER — Other Ambulatory Visit: Payer: Self-pay | Admitting: Adult Health

## 2019-10-11 DIAGNOSIS — Z760 Encounter for issue of repeat prescription: Secondary | ICD-10-CM

## 2019-10-15 NOTE — Telephone Encounter (Signed)
THIS MEDICATION HAS BEEN DISCONTINUED.

## 2019-10-30 DEATH — deceased

## 2020-06-02 IMAGING — DX DG CHEST 1V PORT
1 series · 1 of 1 positions shown · non-contrast
Comparison: None.

CLINICAL DATA: 73-year-old female with failure to thrive.

EXAM:
PORTABLE CHEST 1 VIEW

[chest ap]
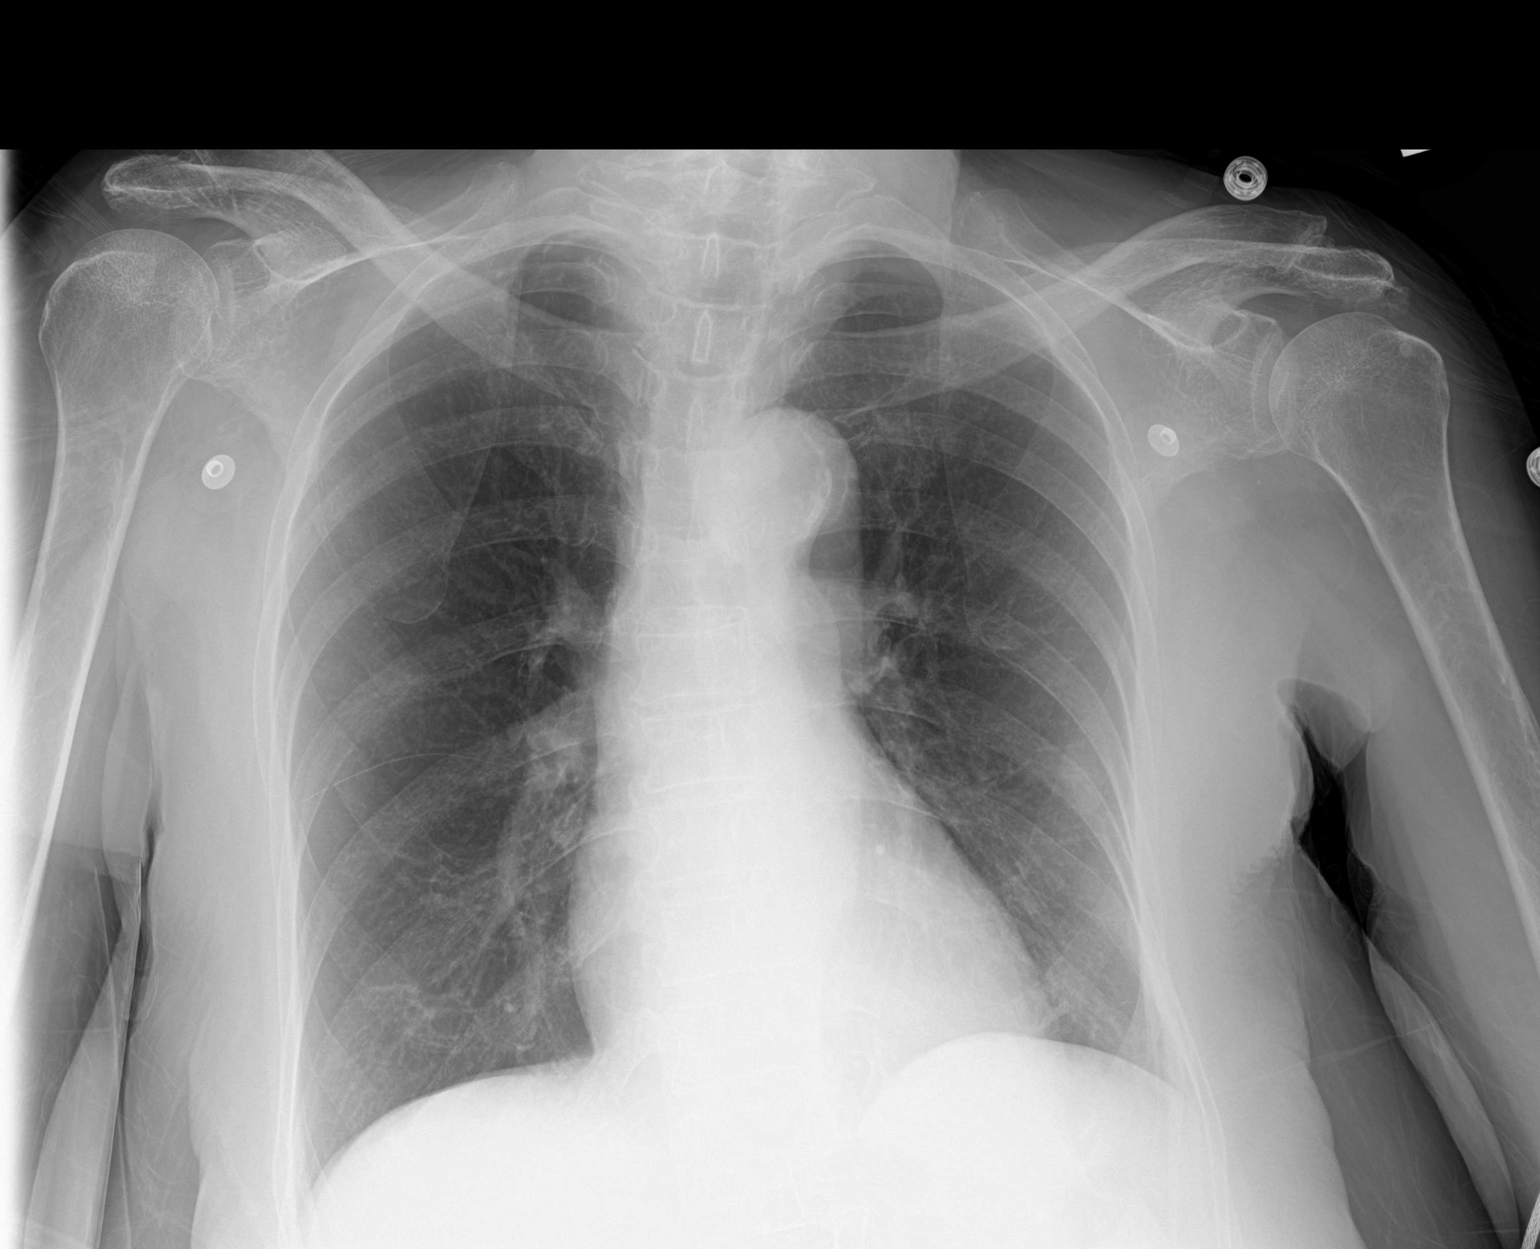

[1 of 1 positions shown; findings below may reference images not displayed]

FINDINGS: The lungs are clear. There is no pleural effusion or pneumothorax.
The cardiac silhouette is within normal limits. Atherosclerotic
calcification of the aorta. No acute osseous pathology. Osteopenia.
IMPRESSION: No active disease.

## 2020-06-07 IMAGING — US US RENAL
1 series · 14 of 18 positions shown · non-contrast
Comparison: None.

CLINICAL DATA: Acute kidney injury.

EXAM:
RENAL / URINARY TRACT ULTRASOUND COMPLETE

[Series 1: us renal · 14 of 18 slices shown]
[im 1/18]
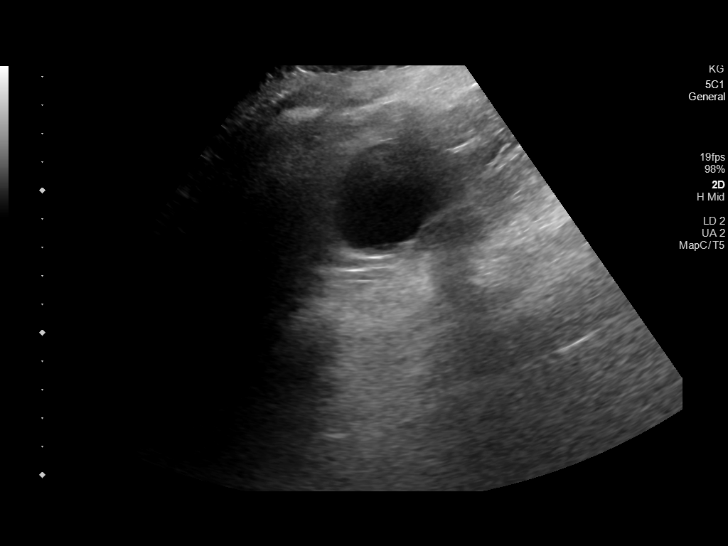
[im 2/18]
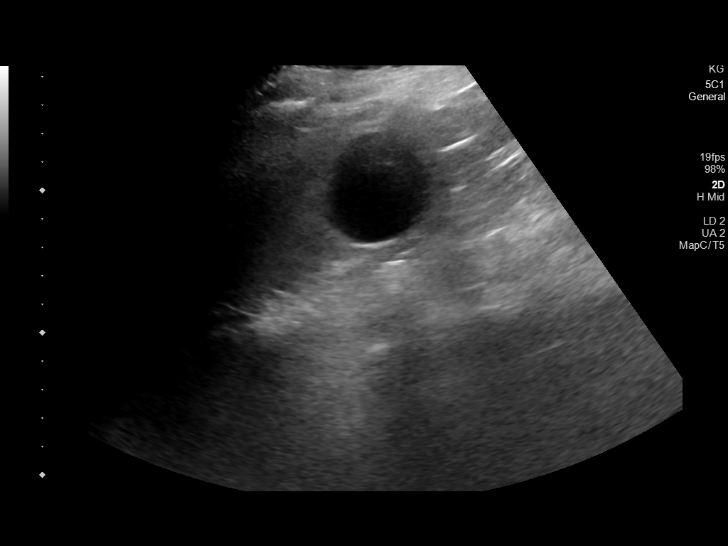
[im 4/18]
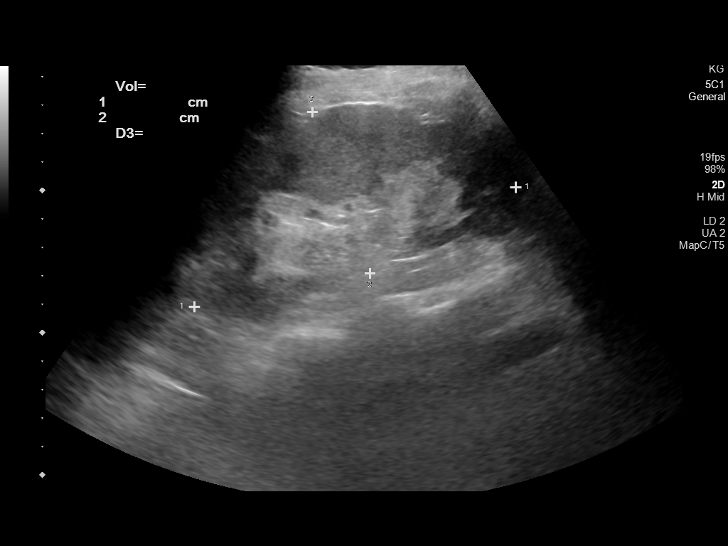
[im 5/18]
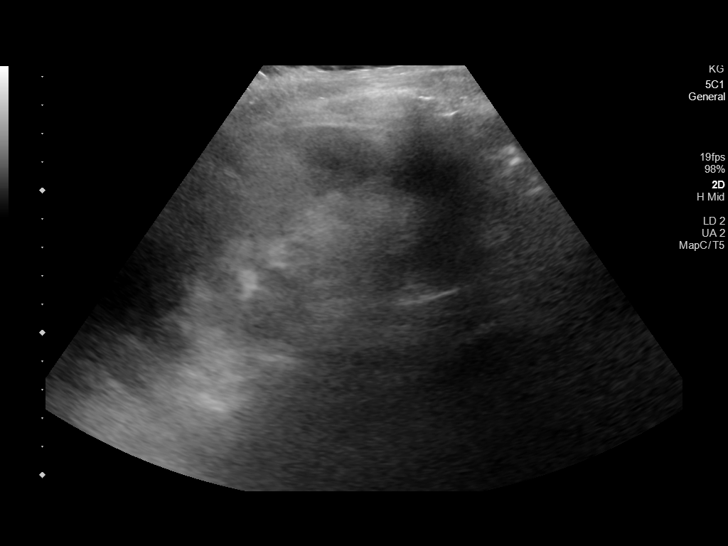
[im 6/18]
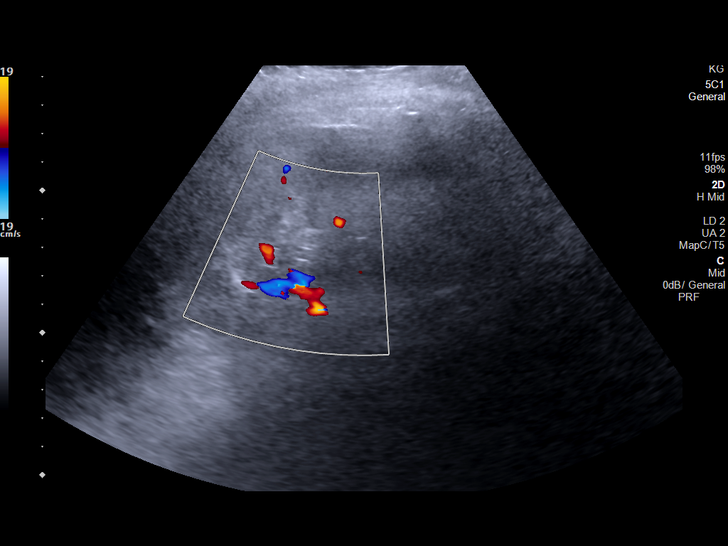
[im 8/18]
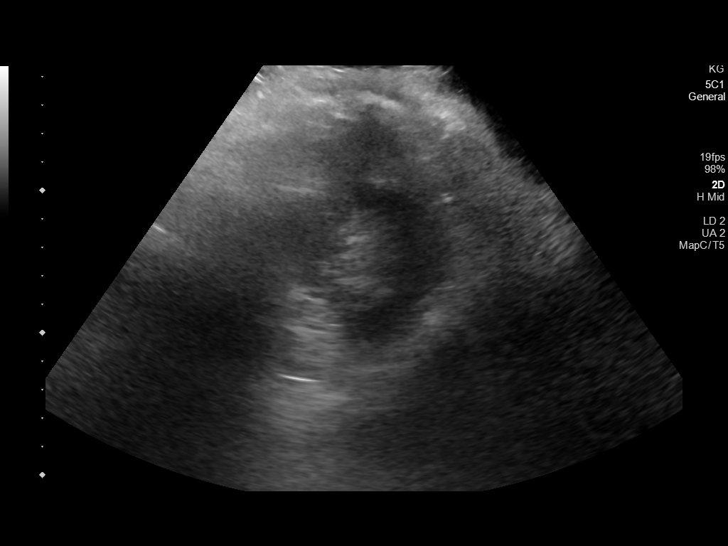
[im 9/18]
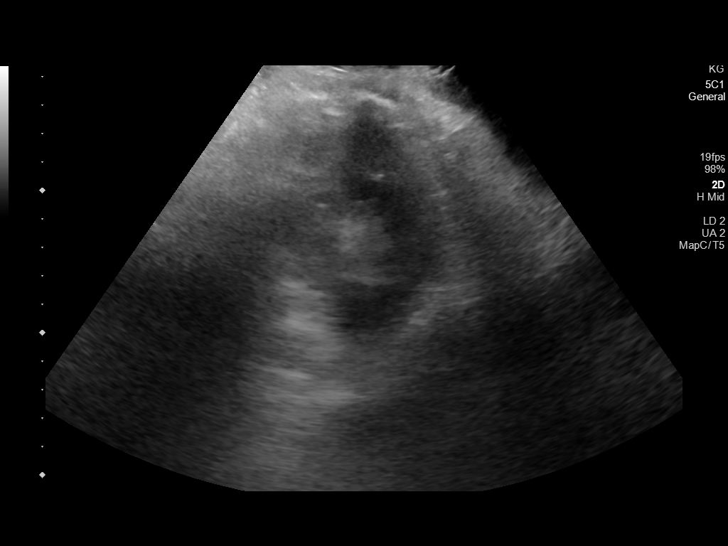
[im 10/18]
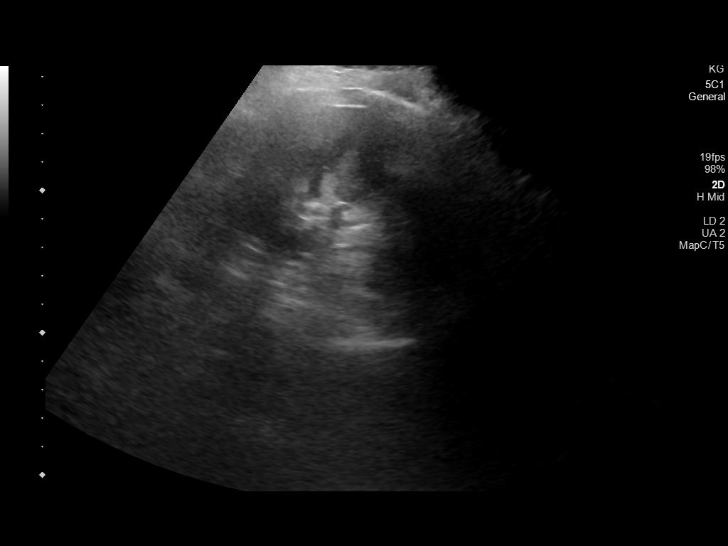
[im 11/18]
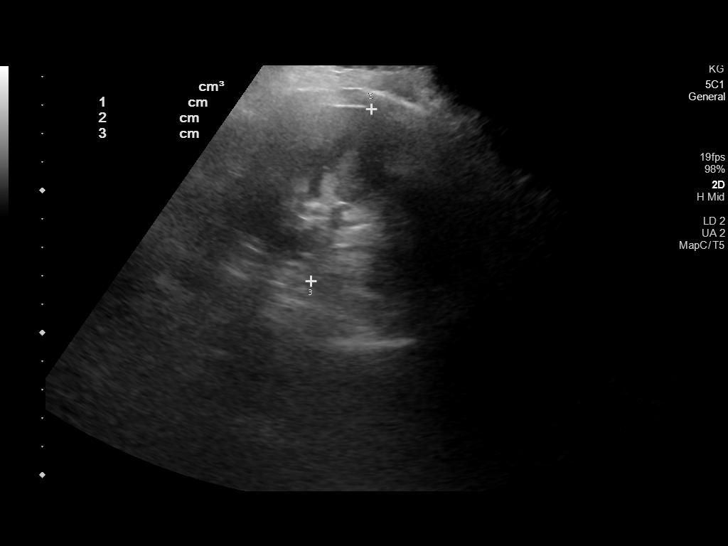
[im 13/18]
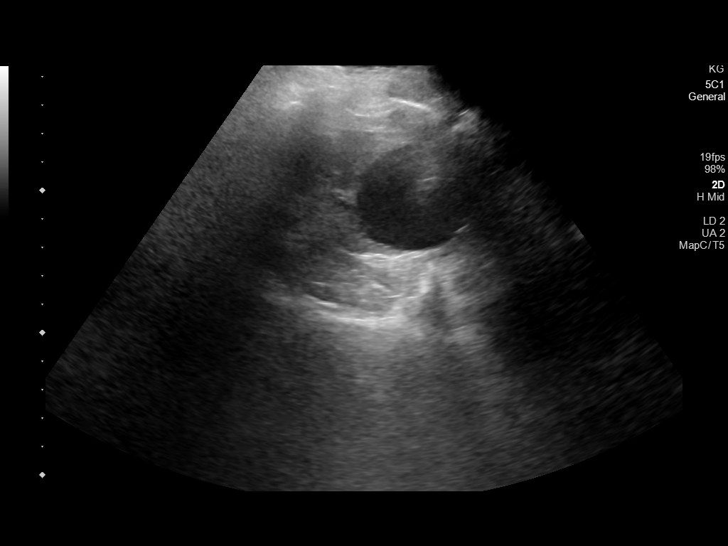
[im 14/18]
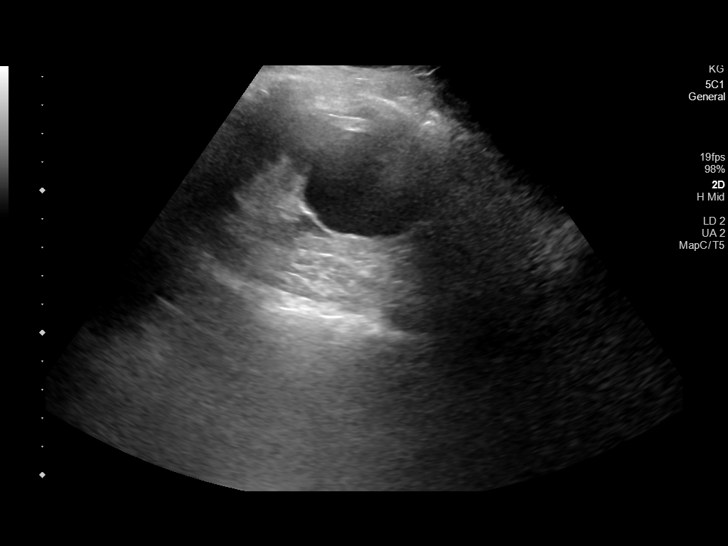
[im 15/18]
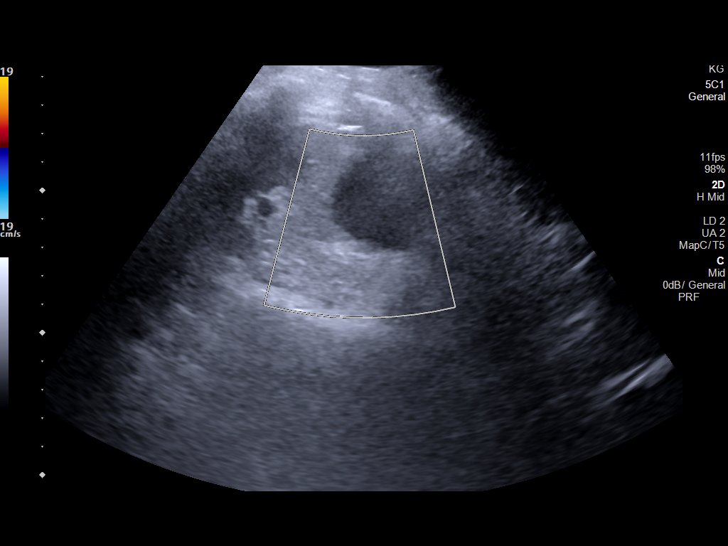
[im 17/18]
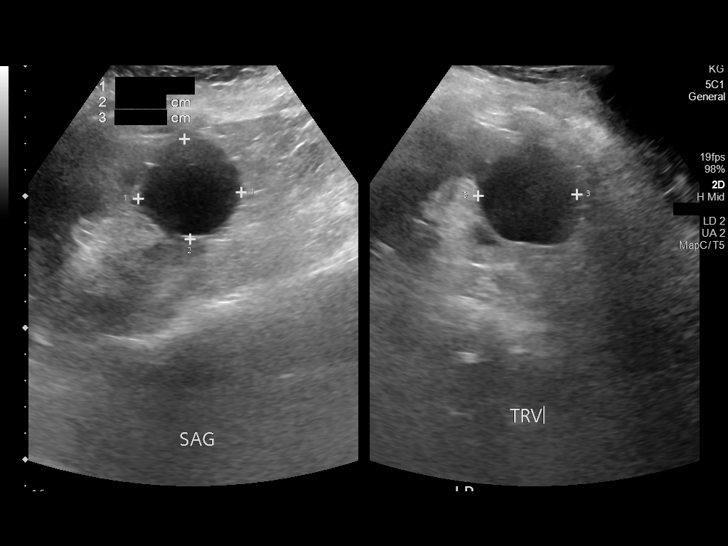
[im 18/18]
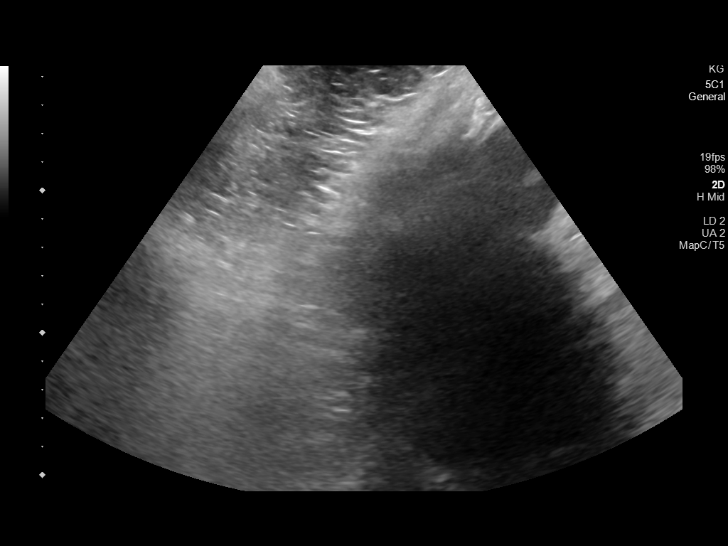

[14 of 18 positions shown; findings below may reference images not displayed]

FINDINGS: Right Kidney:

Not visualized as the patient is combative and unable to position
properly.

Left Kidney:

Renal measurements: 12.1 x 6.4 x 6.0 cm = volume: 243 mL. 3.9 cm
simple cyst is seen in lower pole. Echogenicity within normal
limits. No mass or hydronephrosis visualized.

Bladder:

Unable to visualize due to limitations described above.

Other:

None.
IMPRESSION: Right kidney and urinary bladder not visualized as patient could not
be positioned properly. 3.9 cm simple left renal cyst is noted. Left
kidney is otherwise unremarkable.

## 2020-06-07 IMAGING — DX DG CHEST 1V PORT
1 series · 1 of 1 positions shown · non-contrast
Comparison: 09/04/2019

CLINICAL DATA: 73 y.o. female c/o weakness. Medical history
significant for dementia, hypertension, CKD, CVA, depression,
glaucoma, cataracts, was brought to the ED by her daughter
complaining of poor oral intake for the past 2-3 weeks

EXAM:
PORTABLE CHEST - 1 VIEW

[chest ap]
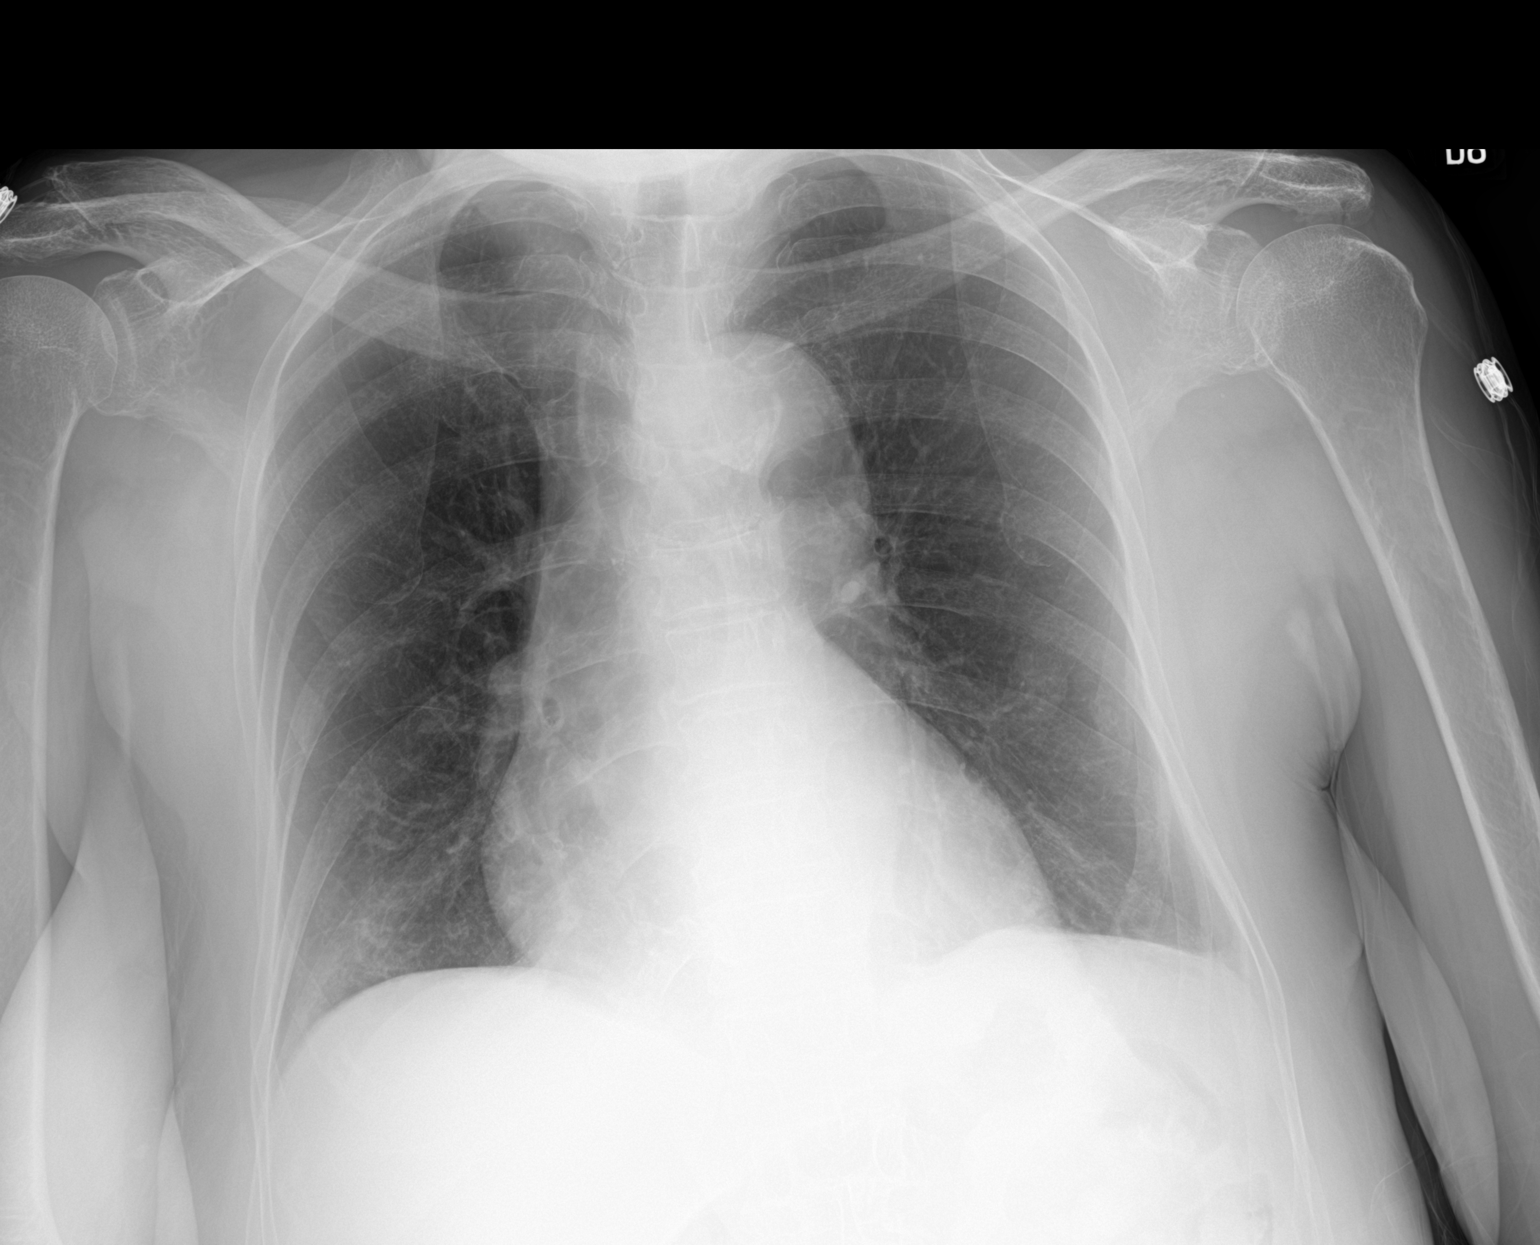

[1 of 1 positions shown; findings below may reference images not displayed]

FINDINGS: Lungs clear.  Heart size upper limits normal.

Aortic Atherosclerosis (85LGV-170.0).

No pleural effusion.  No pneumothorax.

Visualized bones unremarkable.
IMPRESSION: No acute cardiopulmonary disease.

## 2021-10-28 ENCOUNTER — Telehealth: Payer: Self-pay

## 2021-10-28 NOTE — Telephone Encounter (Signed)
Pt's daughter informed this RN that she passed away on 2019/11/01. ?
